# Patient Record
Sex: Male | Born: 1935 | Race: White | Hispanic: No | Marital: Married | Smoking: Former smoker
Health system: Southern US, Community
[De-identification: ages and names within clinical notes are randomized; demographics above are authoritative.]

## PROBLEM LIST (undated history)

## (undated) DIAGNOSIS — K8020 Calculus of gallbladder without cholecystitis without obstruction: Secondary | ICD-10-CM

## (undated) DIAGNOSIS — N183 Chronic kidney disease, stage 3 unspecified: Secondary | ICD-10-CM

## (undated) DIAGNOSIS — I639 Cerebral infarction, unspecified: Secondary | ICD-10-CM

## (undated) DIAGNOSIS — Z8719 Personal history of other diseases of the digestive system: Secondary | ICD-10-CM

## (undated) DIAGNOSIS — I4891 Unspecified atrial fibrillation: Secondary | ICD-10-CM

## (undated) DIAGNOSIS — F1010 Alcohol abuse, uncomplicated: Secondary | ICD-10-CM

## (undated) DIAGNOSIS — N390 Urinary tract infection, site not specified: Secondary | ICD-10-CM

## (undated) DIAGNOSIS — K8050 Calculus of bile duct without cholangitis or cholecystitis without obstruction: Secondary | ICD-10-CM

## (undated) DIAGNOSIS — S065X9A Traumatic subdural hemorrhage with loss of consciousness of unspecified duration, initial encounter: Secondary | ICD-10-CM

## (undated) DIAGNOSIS — I10 Essential (primary) hypertension: Secondary | ICD-10-CM

## (undated) HISTORY — DX: Personal history of other diseases of the digestive system: Z87.19

## (undated) HISTORY — PX: SPHINCTEROTOMY: SHX5279

## (undated) HISTORY — DX: Traumatic subdural hemorrhage with loss of consciousness of unspecified duration, initial encounter: S06.5X9A

## (undated) HISTORY — DX: Calculus of bile duct without cholangitis or cholecystitis without obstruction: K80.50

## (undated) HISTORY — PX: APPENDECTOMY: SHX54

## (undated) HISTORY — PX: CATARACT EXTRACTION: SUR2

## (undated) HISTORY — DX: Unspecified atrial fibrillation: I48.91

## (undated) HISTORY — DX: Alcohol abuse, uncomplicated: F10.10

## (undated) HISTORY — DX: Essential (primary) hypertension: I10

## (undated) HISTORY — DX: Urinary tract infection, site not specified: N39.0

## (undated) HISTORY — DX: Calculus of gallbladder without cholecystitis without obstruction: K80.20

---

## 2002-08-22 ENCOUNTER — Encounter: Payer: Self-pay | Admitting: Emergency Medicine

## 2002-08-22 ENCOUNTER — Inpatient Hospital Stay (HOSPITAL_COMMUNITY): Admission: EM | Admit: 2002-08-22 | Discharge: 2002-08-23 | Payer: Self-pay | Admitting: Emergency Medicine

## 2002-08-23 ENCOUNTER — Encounter: Payer: Self-pay | Admitting: General Surgery

## 2005-04-13 DIAGNOSIS — S065XAA Traumatic subdural hemorrhage with loss of consciousness status unknown, initial encounter: Secondary | ICD-10-CM

## 2005-04-13 DIAGNOSIS — S065X9A Traumatic subdural hemorrhage with loss of consciousness of unspecified duration, initial encounter: Secondary | ICD-10-CM

## 2005-04-13 HISTORY — DX: Traumatic subdural hemorrhage with loss of consciousness status unknown, initial encounter: S06.5XAA

## 2005-04-13 HISTORY — DX: Traumatic subdural hemorrhage with loss of consciousness of unspecified duration, initial encounter: S06.5X9A

## 2005-04-21 ENCOUNTER — Emergency Department (HOSPITAL_COMMUNITY): Admission: EM | Admit: 2005-04-21 | Discharge: 2005-04-21 | Payer: Self-pay | Admitting: Emergency Medicine

## 2005-04-22 ENCOUNTER — Ambulatory Visit (HOSPITAL_COMMUNITY): Admission: RE | Admit: 2005-04-22 | Discharge: 2005-04-22 | Payer: Self-pay | Admitting: Emergency Medicine

## 2006-04-11 ENCOUNTER — Inpatient Hospital Stay (HOSPITAL_COMMUNITY): Admission: EM | Admit: 2006-04-11 | Discharge: 2006-04-14 | Payer: Self-pay | Admitting: Emergency Medicine

## 2006-04-13 ENCOUNTER — Ambulatory Visit: Payer: Self-pay | Admitting: Gastroenterology

## 2006-04-13 DIAGNOSIS — Z8719 Personal history of other diseases of the digestive system: Secondary | ICD-10-CM

## 2006-04-13 HISTORY — DX: Personal history of other diseases of the digestive system: Z87.19

## 2006-06-03 ENCOUNTER — Ambulatory Visit: Payer: Self-pay | Admitting: Infectious Diseases

## 2006-06-03 ENCOUNTER — Inpatient Hospital Stay (HOSPITAL_COMMUNITY): Admission: EM | Admit: 2006-06-03 | Discharge: 2006-06-05 | Payer: Self-pay | Admitting: *Deleted

## 2006-06-15 ENCOUNTER — Ambulatory Visit: Payer: Self-pay | Admitting: *Deleted

## 2006-06-15 DIAGNOSIS — K219 Gastro-esophageal reflux disease without esophagitis: Secondary | ICD-10-CM

## 2006-09-20 ENCOUNTER — Ambulatory Visit: Payer: Self-pay | Admitting: Internal Medicine

## 2006-09-20 DIAGNOSIS — I10 Essential (primary) hypertension: Secondary | ICD-10-CM

## 2006-10-05 ENCOUNTER — Encounter (INDEPENDENT_AMBULATORY_CARE_PROVIDER_SITE_OTHER): Payer: Self-pay | Admitting: *Deleted

## 2006-10-05 ENCOUNTER — Ambulatory Visit: Payer: Self-pay | Admitting: Internal Medicine

## 2006-10-16 ENCOUNTER — Encounter (INDEPENDENT_AMBULATORY_CARE_PROVIDER_SITE_OTHER): Payer: Self-pay | Admitting: *Deleted

## 2006-12-17 ENCOUNTER — Ambulatory Visit: Payer: Self-pay | Admitting: *Deleted

## 2007-05-22 ENCOUNTER — Ambulatory Visit: Payer: Self-pay | Admitting: Internal Medicine

## 2007-05-22 ENCOUNTER — Encounter: Payer: Self-pay | Admitting: Internal Medicine

## 2007-05-22 LAB — CONVERTED CEMR LAB
AST: 18 units/L (ref 0–37)
Alkaline Phosphatase: 68 units/L (ref 39–117)
BUN: 26 mg/dL — ABNORMAL HIGH (ref 6–23)
Creatinine, Ser: 1.33 mg/dL (ref 0.40–1.50)
Glucose, Bld: 98 mg/dL (ref 70–99)
Potassium: 4.4 meq/L (ref 3.5–5.3)
Total Bilirubin: 0.7 mg/dL (ref 0.3–1.2)
Total Protein: 7.4 g/dL (ref 6.0–8.3)

## 2007-05-24 ENCOUNTER — Telehealth (INDEPENDENT_AMBULATORY_CARE_PROVIDER_SITE_OTHER): Payer: Self-pay | Admitting: Pharmacy Technician

## 2007-05-30 ENCOUNTER — Telehealth: Payer: Self-pay | Admitting: Internal Medicine

## 2007-08-26 ENCOUNTER — Telehealth (INDEPENDENT_AMBULATORY_CARE_PROVIDER_SITE_OTHER): Payer: Self-pay | Admitting: *Deleted

## 2007-11-19 ENCOUNTER — Ambulatory Visit: Payer: Self-pay | Admitting: Internal Medicine

## 2007-11-19 ENCOUNTER — Encounter: Payer: Self-pay | Admitting: Internal Medicine

## 2007-11-19 LAB — CONVERTED CEMR LAB
Basophils Absolute: 0 10*3/uL (ref 0.0–0.1)
Basophils Relative: 0 % (ref 0–1)
Cholesterol: 188 mg/dL (ref 0–200)
Eosinophils Absolute: 0.4 10*3/uL (ref 0.0–0.7)
HDL: 57 mg/dL (ref >39)
Hemoglobin: 15.1 g/dL (ref 13.0–17.0)
Lymphocytes Relative: 35 % (ref 12–46)
Monocytes Relative: 8 % (ref 3–12)
Neutro Abs: 3.4 10*3/uL (ref 1.7–7.7)
Total CHOL/HDL Ratio: 3.3
Triglycerides: 99 mg/dL (ref <150)

## 2007-11-27 ENCOUNTER — Ambulatory Visit: Payer: Self-pay | Admitting: Internal Medicine

## 2008-01-07 ENCOUNTER — Encounter: Payer: Self-pay | Admitting: Internal Medicine

## 2008-01-07 ENCOUNTER — Ambulatory Visit: Payer: Self-pay | Admitting: Internal Medicine

## 2008-01-07 LAB — CONVERTED CEMR LAB
BUN: 20 mg/dL (ref 6–23)
Calcium: 9.4 mg/dL (ref 8.4–10.5)
Chloride: 103 meq/L (ref 96–112)
Creatinine, Ser: 1.53 mg/dL — ABNORMAL HIGH (ref 0.40–1.50)
Sodium: 140 meq/L (ref 135–145)

## 2008-04-17 ENCOUNTER — Ambulatory Visit: Payer: Self-pay | Admitting: Internal Medicine

## 2008-04-17 DIAGNOSIS — Z9189 Other specified personal risk factors, not elsewhere classified: Secondary | ICD-10-CM | POA: Insufficient documentation

## 2008-05-04 ENCOUNTER — Ambulatory Visit: Payer: Self-pay | Admitting: Internal Medicine

## 2008-05-04 ENCOUNTER — Encounter (INDEPENDENT_AMBULATORY_CARE_PROVIDER_SITE_OTHER): Payer: Self-pay | Admitting: Internal Medicine

## 2008-05-06 LAB — CONVERTED CEMR LAB
Glucose, Bld: 91 mg/dL (ref 70–99)
Sodium: 141 meq/L (ref 135–145)

## 2008-05-14 ENCOUNTER — Ambulatory Visit: Payer: Self-pay | Admitting: *Deleted

## 2008-11-12 ENCOUNTER — Ambulatory Visit: Payer: Self-pay | Admitting: Internal Medicine

## 2008-11-13 LAB — CONVERTED CEMR LAB
Albumin: 4.5 g/dL (ref 3.5–5.2)
Alkaline Phosphatase: 65 units/L (ref 39–117)
Chloride: 105 meq/L (ref 96–112)
Creatinine, Ser: 1.72 mg/dL — ABNORMAL HIGH (ref 0.40–1.50)
Glucose, Bld: 100 mg/dL — ABNORMAL HIGH (ref 70–99)
Sodium: 141 meq/L (ref 135–145)

## 2009-05-31 ENCOUNTER — Telehealth: Payer: Self-pay | Admitting: Internal Medicine

## 2009-06-28 ENCOUNTER — Telehealth: Payer: Self-pay | Admitting: Internal Medicine

## 2009-07-06 ENCOUNTER — Ambulatory Visit: Payer: Self-pay | Admitting: Internal Medicine

## 2009-07-07 LAB — CONVERTED CEMR LAB
BUN: 31 mg/dL — ABNORMAL HIGH (ref 6–23)
Calcium: 9 mg/dL (ref 8.4–10.5)
Chloride: 103 meq/L (ref 96–112)
Creatinine, Ser: 1.72 mg/dL — ABNORMAL HIGH (ref 0.40–1.50)
Glucose, Bld: 87 mg/dL (ref 70–99)
Potassium: 3.9 meq/L (ref 3.5–5.3)
Sodium: 139 meq/L (ref 135–145)

## 2009-07-16 ENCOUNTER — Encounter: Payer: Self-pay | Admitting: Internal Medicine

## 2009-10-18 ENCOUNTER — Telehealth: Payer: Self-pay | Admitting: Internal Medicine

## 2009-11-29 ENCOUNTER — Telehealth: Payer: Self-pay | Admitting: Internal Medicine

## 2009-12-30 ENCOUNTER — Ambulatory Visit (HOSPITAL_COMMUNITY): Admission: RE | Admit: 2009-12-30 | Discharge: 2009-12-30 | Payer: Self-pay | Admitting: Internal Medicine

## 2009-12-30 ENCOUNTER — Ambulatory Visit: Payer: Self-pay | Admitting: Internal Medicine

## 2009-12-30 DIAGNOSIS — I4891 Unspecified atrial fibrillation: Secondary | ICD-10-CM | POA: Insufficient documentation

## 2010-04-10 LAB — CONVERTED CEMR LAB
CO2: 27 meq/L (ref 19–32)
Creatinine, Ser: 1.62 mg/dL — ABNORMAL HIGH (ref 0.40–1.50)

## 2010-04-12 NOTE — Progress Notes (Signed)
Summary: med refill/gp  Phone Note Refill Request Message from:  Fax from Pharmacy on June 28, 2009 12:08 PM  Refills Requested: Medication #1:  OMEPRAZOLE 40 MG CPDR take 1 tablet podaily.   Last Refilled: 05/24/2009  Method Requested: Electronic Initial call taken by: Chinita Pester RN,  June 28, 2009 12:08 PM    Prescriptions: OMEPRAZOLE 40 MG CPDR (OMEPRAZOLE) take 1 tablet podaily.  #30 x 3   Entered and Authorized by:   Hartley Barefoot MD   Signed by:   Hartley Barefoot MD on 06/28/2009   Method used:   Electronically to        [facility] (retail)       [address]       [place], [place]  [id number]       Ph: [phone] or [phone]       Fax: [phone]   RxID:   [id number]

## 2010-04-12 NOTE — Letter (Signed)
Summary: [facility]   Imported By: Margie Billet 07/20/2009 15:15:46  _____________________________________________________________________  External Attachment:    Type:   Image     Comment:   External Document

## 2010-04-12 NOTE — Progress Notes (Signed)
Summary: med refill/gp  Phone Note Refill Request Message from:  Fax from Pharmacy on May 31, 2009 2:40 PM  Refills Requested: Medication #1:  NORVASC 5 MG TABS take 1 tablet by mouth daily.   Last Refilled: 05/03/2009  Method Requested: Electronic Initial call taken by: Chinita Pester RN,  May 31, 2009 2:40 PM    Prescriptions: NORVASC 5 MG TABS (AMLODIPINE BESYLATE) take 1 tablet by mouth daily.  #30 x 5   Entered and Authorized by:   Hartley Barefoot MD   Signed by:   Hartley Barefoot MD on 05/31/2009   Method used:   Electronically to        [facility] (retail)       [address]       [place], [place]  [address]       Ph: [phone] or [phone]       Fax: [phone]   RxID:   [id number]
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
## 2010-04-12 NOTE — Progress Notes (Signed)
Summary: Refill/gh  Phone Note Refill Request Message from:  Fax from Pharmacy on October 18, 2009 9:37 AM  Refills Requested: Medication #1:  OMEPRAZOLE 40 MG CPDR take 1 tablet podaily.   Last Refilled: 09/20/2009  Method Requested: Electronic Initial call taken by: Angelina Ok RN,  October 18, 2009 9:37 AM    Prescriptions: OMEPRAZOLE 40 MG CPDR (OMEPRAZOLE) take 1 tablet podaily.  #30 x 6   Entered and Authorized by:   Zoila Shutter MD   Signed by:   Zoila Shutter MD on 10/18/2009   Method used:   Electronically to        [facility] (retail)       [address]       [place], [place]  [id number]       Ph: [phone] or [phone]       Fax: [phone]   RxID:   [id number]

## 2010-04-12 NOTE — Progress Notes (Signed)
Summary: Refill/gh  Phone Note Refill Request Message from:  Fax from Pharmacy on November 29, 2009 11:56 AM  Refills Requested: Medication #1:  NORVASC 5 MG TABS take 1 tablet by mouth daily.   Last Refilled: 10/26/2009  Method Requested: Electronic Initial call taken by: Angelina Ok RN,  November 29, 2009 11:56 AM    Prescriptions: NORVASC 5 MG TABS (AMLODIPINE BESYLATE) take 1 tablet by mouth daily.  #30 x 5   Entered and Authorized by:   Zoila Shutter MD   Signed by:   Zoila Shutter MD on 11/29/2009   Method used:   Electronically to        [facility] (retail)       [address]       [place], [place]  [address]       Ph: [phone] or [phone]       Fax: [phone]   RxID:   [id number]

## 2010-05-24 ENCOUNTER — Other Ambulatory Visit: Payer: Self-pay | Admitting: Internal Medicine

## 2010-05-31 ENCOUNTER — Other Ambulatory Visit: Payer: Self-pay | Admitting: *Deleted

## 2010-06-01 ENCOUNTER — Other Ambulatory Visit: Payer: Self-pay | Admitting: Internal Medicine

## 2010-06-02 MED ORDER — AMLODIPINE BESYLATE 5 MG PO TABS: 5.0000 mg | ORAL_TABLET | Freq: Every day | ORAL | Status: DC

## 2010-06-13 ENCOUNTER — Ambulatory Visit (INDEPENDENT_AMBULATORY_CARE_PROVIDER_SITE_OTHER): Payer: MEDICARE | Admitting: Internal Medicine

## 2010-06-13 ENCOUNTER — Telehealth: Payer: Self-pay | Admitting: *Deleted

## 2010-06-13 ENCOUNTER — Encounter: Payer: Self-pay | Admitting: Internal Medicine

## 2010-06-13 VITALS — BP 103/63 | HR 70 | Temp 97.2°F | Ht 69.5 in | Wt 171.6 lb

## 2010-06-13 DIAGNOSIS — R197 Diarrhea, unspecified: Secondary | ICD-10-CM

## 2010-06-13 NOTE — Progress Notes (Signed)
  Subjective:    Patient ID: [name] Page, male    DOB: Dec 03, 1924, 77 y.o.   MRN: [id number]  HPI Mr Sohn here with his wife of 78 years ("I'd say I know a thing or two about this man") who says he has had difficuly with loose stools and some accidents at nighttime over the last three days-has been drinking milk,  And has had a perfectly good appetite (had a big plate of fried oysters yesterday, so seems to be doing fine) after the trip to [facility] yesterday, diarrhea got worse.  No orthostatic sx's, cramps, N/V or fevers, blood in stool, or any other sx's   Review of Systems     Objective:   Physical Exam Sturdy looking man, hard of hearing, but in NAD HEENT: normal Lungs: CTA CAR: normal Abd: Soft no TTP, NABS Ext, warm, dry, no edema       Assessment & Plan:   Diarrhea Given basic advice on foods to eat, avoid milk products, and avoid greasy foods Take immodium prn FU if sx's persist >48 hrs or worsen

## 2010-06-13 NOTE — Telephone Encounter (Signed)
Agree with plan 

## 2010-06-13 NOTE — Telephone Encounter (Signed)
Pt's spouse calls to say pt has diarrhea x 4days, denies anyone else in family being sick, most everything he eats causes gi upset, denies N&V, he has been having small amts of incontinent stool at night, enough to soil the sheets. She is worried about dehydration due to the fact that the things he should be taking in are things he does not like.  he is given an appt w/ dr Reche Dixon at 1100

## 2010-06-13 NOTE — Patient Instructions (Signed)
DON'T TAKE THE EXPENSIVE BLOOD PRESSURE PILL DIOVAN-HCTZ-UNTIL WED MORNING  TAKE IMMODIUM UP TO 4 TIMES A DAY--OK TO GIVE IT WITH MEALS AND AT BEDTIME FOR THE NEXT TWO DAYS IF HE STILL HAS DIARRHEAH  NO RAW OYSTERS, FRIED FOODS OR MILK PRODUCTS FOR THE NEXT 4-5 DAYS

## 2010-07-25 ENCOUNTER — Encounter: Payer: Self-pay | Admitting: Internal Medicine

## 2010-07-25 DIAGNOSIS — K219 Gastro-esophageal reflux disease without esophagitis: Secondary | ICD-10-CM

## 2010-07-26 ENCOUNTER — Encounter: Payer: Self-pay | Admitting: Internal Medicine

## 2010-07-26 ENCOUNTER — Ambulatory Visit (INDEPENDENT_AMBULATORY_CARE_PROVIDER_SITE_OTHER): Payer: MEDICARE | Admitting: Internal Medicine

## 2010-07-26 VITALS — BP 120/62 | HR 64 | Temp 98.1°F | Ht 69.5 in | Wt 173.3 lb

## 2010-07-26 DIAGNOSIS — I639 Cerebral infarction, unspecified: Secondary | ICD-10-CM | POA: Insufficient documentation

## 2010-07-26 DIAGNOSIS — Z7289 Other problems related to lifestyle: Secondary | ICD-10-CM | POA: Insufficient documentation

## 2010-07-26 DIAGNOSIS — I6350 Cerebral infarction due to unspecified occlusion or stenosis of unspecified cerebral artery: Secondary | ICD-10-CM

## 2010-07-26 DIAGNOSIS — I4891 Unspecified atrial fibrillation: Secondary | ICD-10-CM

## 2010-07-26 DIAGNOSIS — N259 Disorder resulting from impaired renal tubular function, unspecified: Secondary | ICD-10-CM

## 2010-07-26 DIAGNOSIS — H6120 Impacted cerumen, unspecified ear: Secondary | ICD-10-CM

## 2010-07-26 DIAGNOSIS — I10 Essential (primary) hypertension: Secondary | ICD-10-CM

## 2010-07-26 LAB — COMPREHENSIVE METABOLIC PANEL
ALT: 13 U/L (ref 0–53)
Alkaline Phosphatase: 72 U/L (ref 39–117)
Chloride: 101 mEq/L (ref 96–112)
Creat: 1.5 mg/dL (ref 0.40–1.50)
Glucose, Bld: 95 mg/dL (ref 70–99)
Sodium: 137 mEq/L (ref 135–145)
Total Protein: 6.9 g/dL (ref 6.0–8.3)

## 2010-07-26 LAB — LIPID PANEL
Cholesterol: 179 mg/dL (ref 0–200)
LDL Cholesterol: 103 mg/dL — ABNORMAL HIGH (ref 0–99)
Triglycerides: 82 mg/dL (ref <150)
VLDL: 16 mg/dL (ref 0–40)

## 2010-07-26 MED ORDER — VALSARTAN-HYDROCHLOROTHIAZIDE 160-25 MG PO TABS: 1.0000 | ORAL_TABLET | Freq: Every day | ORAL | Status: DC

## 2010-07-26 NOTE — Assessment & Plan Note (Signed)
Ear irrigation successfully performed by the nurse on the left canal.

## 2010-07-26 NOTE — Assessment & Plan Note (Signed)
This is stable and rate control. Patient is not a candidate for Coumadin and neither he or his wife want to be on even a low dose aspirin because of his hospitalization for severe GI bleed in 2008. Risks and benefits are discussed with them at length.
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
## 2010-07-26 NOTE — Assessment & Plan Note (Signed)
Patient has an history of alcohol abuse at this point he is drinking to peers today by his and his wife's history and I don't think that B. appears to change or that I can tell it is causing a problem at this point. We'll follow.

## 2010-07-26 NOTE — Assessment & Plan Note (Signed)
Again as per discussion during the history of present illness patient has had probable mild cerebrovascular accident versus a mechanical dysfunction of his lower extremities. Again at this point neither he or his wife wanted him to be on either aspirin or have any further imaging. We'll continue with aggressive blood pressure control and monitoring.

## 2010-07-26 NOTE — Assessment & Plan Note (Signed)
Will check C met today 

## 2010-07-26 NOTE — Patient Instructions (Signed)
I will see you back in 6 months. We will let you know if your labs are abnormal.

## 2010-07-26 NOTE — Assessment & Plan Note (Signed)
Blood pressure is under excellent control and this is probably the most important thing to do as far as preventing his risks for further cerebrovascular accident.
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
## 2010-07-29 NOTE — H&P (Signed)
[name] Page, [name] Page              ACCOUNT NO.:  [id number]   MEDICAL RECORD NO.:  [id number]          PATIENT TYPE:  INP   LOCATION:  5005                         FACILITY:  [facility]   PHYSICIAN:  Wilson Singer, M.D.DATE OF BIRTH:  23-Aug-1924   DATE OF ADMISSION:  04/11/2006  DATE OF DISCHARGE:                              HISTORY & PHYSICAL   HISTORY OF PRESENT ILLNESS:  This 77 year old man who has no primary  care physician presents with a 4-day history of feeling weak.  He does  describe a history of melena approximately 5 days ago and he also told  the emergency room physician that there was some blood in the stool  today.  He has also been feeling light-headed and dizzy, but has not had  any frank syncopal episodes.  There is no abdominal pain, nausea, or  vomiting.  The only past significant history he has is one of  hypertension.  He has not had a physician for many years after his  previous physician, Dr. Scotty Court, retired.   PAST MEDICAL HISTORY:  Hypertension, as mentioned above.   PAST SURGICAL HISTORY:  Appendectomy at the age of 77 years old.   MEDICATIONS:  Micardis HCT 80/12.5 one tablet q. Daily.   ALLERGIES:  NONE.   SOCIAL HISTORY:  He has been married for over 50 years.  He does smoke 1  pack of cigarettes about every 3 days.  He does not drink alcohol.  He  is retired.   FAMILY HISTORY:  Noncontributory.   REVIEW OF SYSTEMS:  Apart from the symptoms mentioned above, there are  no other symptoms referable to the constitutional, ENT, respiratory,  cardiovascular, gastroenterology, genitourinary, musculoskeletal,  dermatological, neurological, endocrine, psychiatric systems.   PHYSICAL EXAMINATION:  He is alert and oriented but he does look pale.  Blood pressure 108/44, pulse 90 and respirations 12.  CARDIOVASCULAR:  Heart sounds are present and normal without murmurs.  RESPIRATORY:  Lungs fields are clear.  ABDOMEN:  Is soft and nontender.  There is no  hepatosplenomegaly.  RECTAL EXAMINATION was deferred as history is very clear.  NEUROLOGICAL:  Alert and oriented.  There are no focal neurologic signs.  MUSCULOSKELETAL:  There are no joint effusions or deformities.   INVESTIGATION:  Sodium 140, potassium 3.8, chloride 109, BUN 55, glucose  95, pH 7.43, hemoglobin 5.9, white blood cell count 8.0, platelets  152,000.   IMPRESSION AND PLAN:  1. Gastrointestinal bleed.  2. History of hypertension.  3. Atrial fibrillation on rhythm strip.  He is hemodynamically stable      with no evidence of active bleeding at the current time.   Plan:  1. We will need to give him blood in view of very low hemoglobin.  2. GI consult with a view to upper and possibly lower GI endoscopy.  3. Further recommendations will depend on this patient's progress.           ______________________________  Wilson Singer, M.D.     NCG/MEDQ  D:  04/11/2006  T:  04/12/2006  Job:  [id number]

## 2010-07-29 NOTE — Discharge Summary (Signed)
NAMESAYVION, VIGEN NO.:  [id number]   MEDICAL RECORD NO.:  [id number]          PATIENT TYPE:  INP   LOCATION:  4739                         FACILITY:  [facility]   PHYSICIAN:  Fransisco Hertz, M.D.  DATE OF BIRTH:  09-May-1924   DATE OF ADMISSION:  06/03/2006  DATE OF DISCHARGE:  06/05/2006                               DISCHARGE SUMMARY   DISCHARGE DIAGNOSES:  1. Community-acquired pneumonia.  2. Atrial fibrillation.  3. History of hypertension.  4. Chronic renal insufficiency.  5. History of upper gastrointestinal bleed secondary to peptic ulcer      disease.   DISCHARGE MEDICATIONS:  1. Avelox 400 mg 1 tablet p.o. daily for 7 days.  2. Protonix 40 mg p.o. daily.   DISPOSITION AND FOLLOWUP:  The patient will follow up with Dr. Phillips Odor  at the [facility] outpatient clinic on April4 at 10:30 a.m. At that  time, he should be evaluated for continued resolution of his pneumonia.  Please make sure that he is not febrile and does not have white count.  Also please ensure that a repeat chest CT scan is followed up in 4-6  weeks to ensure resolution of his multifocal pneumonia.   IMAGING PERFORMED THIS HOSPITALIZATION:  1. A chest x-ray on June 03, 2006, consistent with a right upper lobe      pneumonia.  A repeat chest x-ray on June 04, 2006, consistent with      scattered areas of air space opacification consistent with      multifocal pneumonia, progression in the lung bases, particularly      left-side.  2. Chest CT on June 04, 2006, consistent with consolidated infiltrate      within both lung bases in the posterior basal segment of the right      lower lobe.  Both of the right-sided infiltrates have areas of      central necrosis.  There is also a small right pleural effusion.      Right suprahilar adenopathy is suspected as well and      cholelithiasis.   BRIEF HISTORY AND PHYSICAL:  For full details, please refer to the  chart, but in brief, Mr.  Lucarelli is a very active 77 year old Caucasian  male who has a history of hypertension, not on any antihypertensives,  and a recent hospitalization in February2008 for an acute GI bleed  secondary to peptic ulcer disease caused by NSAID use.  He did not have  a regular primary care physician,  and he presented to the [facility]  emergency department complaining of chest discomfort, especially on deep  inspiration, since 1 week following a brief viral illness.  Chest  discomfort was accompanied by productive cough, whitish in color with  minimal quantity. No hemoptysis and some dyspnea on exertion.  He is a  long-time smoker.   PHYSICAL EXAMINATION:  VITAL SIGNS:  Upon admission, temperature 98.5,  blood pressure 158/88, heart rate 94, respirations 22, O2 saturation 97%  on room air.   LABORATORY DATA:  Upon admission, sodium 137, potassium 4.2, chloride  105, bicarb 25, BUN 16,  creatinine 1.5, anion gap of 7.  WBC 8.3 with an  ANC of 6.3, hemoglobin  11.  His last hemoglobin upon discharge from the  hospital in February2008 was 10.4, MCV of 97.7, RDW 16, platelets  344.  His UA was negative.  He also had cardiac enzymes which were all  negative.  Last set was CK of 37, CK-MB of 1.9 and a troponin I of 0.04.   HOSPITAL COURSE:  #1.  PLEURITIC CHEST PAIN:  Acute coronary syndrome was ruled out with a  normal EKG and three sets of cardiac enzymes. Of course, this pleuritic  chest pain was most concerning for some sort of the lung pathology.  He  never had a white count or was never febrile; however, a chest x-ray  demonstrated some infiltrates consistent with pneumonia which is why he  was placed on a community-acquired regimen with Rocephin and  azithromycin. However, given the fact that on second day his pneumonia  progressed to having several infiltrates in both lungs and the fact that  his pleuritic chest pain continued, we decided to get a chest CT that  showed multifocal pneumonia  with two areas of central necrosis on the  right side and some questionable right suprahilar lymphadenopathy.  However, again the patient never developed a white count and never  developed a fever, so we transitioned him to a p.o. regimen of Avelox.  His chest discomfort is much improved upon day of discharge.  What we  would like to do is send him home with a 7-day regimen of Avelox, and he  will need a repeat chest CT scan in about 4-6 weeks to ensure resolution  of his pneumonia and to make sure he does not have any masses that could  be leading to some sort of post obstructive pneumonia, specifically  given the fact that he is a long-time smoker.   #2.  ATRIAL FIBRILLATION:  Atrial fibrillation was discovered on his  previous hospital admission in February.  He is not on anticoagulation  secondary to GI bleed.   #3.  UPPER GASTROINTESTINAL BLEED:  This was not an issue throughout  this hospitalization. Hemoglobin never dropped. Vital signs stable.  We  kept him on his home Protonix regimen.   #4.  CHRONIC RENAL INSUFFICIENCY:  Creatinine is at baseline; in fact, a  little bit improved on day of discharge at 1.3.   #5.  HYPERTENSION:  He is currently not on any antihypertensives, and we  have kept him so throughout this hospitalization.   All other chronic medical issues were stable.   LABORATORY DATA AND VITAL SIGNS ON DAY OF DISCHARGE:  Temperature 98.5,  blood pressure 131/87, heart rate 90, respirations 20, O2 saturation 96%  on room air.  Labs on day of discharge: WBC 5.7, hemoglobin 10.5,  platelets 301.  Sodium 135, potassium 4.3, chloride 106, bicarb 24 BUN  10, creatinine 1.3, glucose 99. Respiratory culture is most likely  contaminated oral flora that shows few gram-positive cocci in clusters,  few gram-negative rods and few gram-positive rods.      Peggye Pitt, M.D.  Electronically Signed     Fransisco Hertz, M.D.  Electronically Signed   EH/MEDQ  D:   06/05/2006  T:  06/05/2006  Job:  [id number]   cc:   Edsel Petrin, D.O.

## 2010-07-29 NOTE — Discharge Summary (Signed)
[name] Page, [name] Page NO.:  [id number]   MEDICAL RECORD NO.:  [id number]          PATIENT TYPE:  INP   LOCATION:  5005                         FACILITY:  [facility]   PHYSICIAN:  Isidor Holts, M.D.  DATE OF BIRTH:  [birth year]   DATE OF ADMISSION:  04/11/2006  DATE OF DISCHARGE:  04/14/2006                               DISCHARGE SUMMARY   DISCHARGE DIAGNOSES:  1. Acute upper gastrointestinal bleed, secondary to nonsteroidal anti-      inflammatory-induced bleeding duodenal ulcer.  2. Acute blood loss anemia secondary to #1 above. Required transfusion      of 4 units packed red blood cells.  3. History of hypertension.  4. Smoking history.  5. Atrial fibrillation, new diagnosis.   DISCHARGE MEDICATIONS:  Protonix 40 mg p.o. daily.   PROCEDURE:  Upper GI endoscopy, dated April 12, 2006, by Dr. Christella Hartigan.  This showed actively oozing duodenal bulb ulcer.  This was treated with  2 cc of dilute epinephrine and 2 Endoclips.  CLO biopsy taken.  Duodenal  ulcer was 5-6-mm in diameter.   CONSULTATIONS:  Rachael Fee, M.D., gastroenterologist.   ADMISSION HISTORY:  As in H&P notes of April 11, 3005, dictated by Dr.  Lilly Cove.  However, in brief this is a 77 year old male, with  known history of hypertension, otherwise no significant past medical  history, and who had not seen a physician for several years following  retirement of his previous primary care physician, Dr. Scotty Court.  He  presented with a 4-day history of weakness, preceded by melena 5 days  previously, and also a single episode of melena on the day of  presentation.  Initial laboratory evaluation showed a hemoglobin of 5.1,  as well as a high BUN/creatinine ratio with BUN 55, creatinine 1.5,  consistent with GI bleed.  On detailed questioning, it turned out that  this patient habitually utilizes [place] powder. He was admitted for  further evaluation, investigation, and management.   CLINICAL  COURSE:  1. Acute GI bleed.  The patient was hemodynamically stable at the time      of presentation, with a BP of 108/44.  Pulse 90.  However, he did      look clinically pale, so was managed with transfusion of packed red      blood cells, 4 units in total with normalization of his hematologic      indices, so that by April 14, 2006, hemoglobin was      stable/satisfactory at 10.4 with a hematocrit of 30.8.  GI      consultation was called, and was kindly provided by Dr. Rob Bunting, who performed an upper GI endoscopy on April 12, 2006.      This showed a 5-6 mm shallow but actively oozing duodenal ulcer in      the distal duodenal bulb.  This was addressed with injection of      dilute epinephrine in two locations and 2 Endoclips were placed,      with cessation of bleeding.  Several biopsies were taken from the  gastric antrum and the duodenal bulb.  These turned out to be      negative for H. pylori. Conclusion, is that the patient has an      NSAID-induced duodenal ulcer which was precipitated by his habit of      utilizing [employer] Powders on a regular basis.  He was managed with      proton pump inhibitor treatment and strongly recommended never to      utilize NSAIDs ever again.  The patient thereafter, remained stable      during the course of his hospitalization.  No further episodes of      melena were recorded.  And by April 14, 2006, he was considered      clinically stable to be discharged.   1. Hypertension.  The patient has a known history of hypertension, and      had been on Micardis/HCT (870/12.5) daily.  However, during this      hospitalization, his BP remained at low normal levels, and on      April 14, 2006, was actually 103/63-mmHg.  Antihypertensives have      not be recommenced.  The patient was recommended to stay off them,      but on a heart healthy diet and to establish followup with a PMD,      who will monitor his blood pressure and  reinstate medications if      indicated.   1. Atrial fibrillation.  The patient was found to be in rate-      controlled atrial fibrillation at the time of presentation.  This      is a new diagnosis.  The patient remained rate controlled, during      the course of his hospitalization.  Clearly, he will not be a      candidate for anticoagulation, secondary to his recent upper GI      bleed.   1. Smoking history.  The patient has been counseled appropriately.   DISPOSITION:  The patient was considered sufficiently clinically  recovered and stable to be discharged on April 14, 2006.   DIET:  Heart healthy diet.   WOUND CARE:  Not applicable.   ACTIVITY:  No restrictions.   FOLLOWUP INSTRUCTIONS:  The patient is recommended to establish a PMD  for routine and preventative care.  He has been supplied appropriate  information.   SPECIAL INSTRUCTIONS:  The patient is recommended to avoid anti-  inflammatory medications, including Goody Power, Aleve, ibuprofen,  Advil, etc.  He is also instructed to stop the Micardis/HCT (80/12.5) as  blood pressure is normal currently.  All this has been communicated to  the patient.  He has verbalized understanding.      Isidor Holts, M.D.  Electronically Signed     CO/MEDQ  D:  04/14/2006  T:  04/14/2006  Job:  [id number]
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
## 2011-05-12 ENCOUNTER — Encounter: Payer: Self-pay | Admitting: Internal Medicine

## 2011-05-12 ENCOUNTER — Ambulatory Visit (INDEPENDENT_AMBULATORY_CARE_PROVIDER_SITE_OTHER): Payer: Medicare Other | Admitting: Internal Medicine

## 2011-05-12 DIAGNOSIS — I10 Essential (primary) hypertension: Secondary | ICD-10-CM

## 2011-05-12 DIAGNOSIS — K219 Gastro-esophageal reflux disease without esophagitis: Secondary | ICD-10-CM

## 2011-05-12 DIAGNOSIS — I4891 Unspecified atrial fibrillation: Secondary | ICD-10-CM

## 2011-05-12 DIAGNOSIS — L259 Unspecified contact dermatitis, unspecified cause: Secondary | ICD-10-CM

## 2011-05-12 DIAGNOSIS — H6120 Impacted cerumen, unspecified ear: Secondary | ICD-10-CM

## 2011-05-12 DIAGNOSIS — M25561 Pain in right knee: Secondary | ICD-10-CM | POA: Insufficient documentation

## 2011-05-12 DIAGNOSIS — M25569 Pain in unspecified knee: Secondary | ICD-10-CM

## 2011-05-12 DIAGNOSIS — L309 Dermatitis, unspecified: Secondary | ICD-10-CM

## 2011-05-12 MED ORDER — OMEPRAZOLE 40 MG PO CPDR: 40.0000 mg | DELAYED_RELEASE_CAPSULE | Freq: Every day | ORAL | Status: DC

## 2011-05-12 MED ORDER — DESONIDE 0.05 % EX GEL
Freq: Two times a day (BID) | CUTANEOUS | Status: DC
Start: 2011-05-12 — End: 2011-11-22

## 2011-05-12 MED ORDER — DICLOFENAC SODIUM 1 % TD GEL: 1.0000 "application " | Freq: Four times a day (QID) | TRANSDERMAL | Status: DC

## 2011-05-12 MED ORDER — CLOBETASOL PROPIONATE 0.05 % EX CREA: TOPICAL_CREAM | Freq: Two times a day (BID) | CUTANEOUS | Status: DC

## 2011-05-12 MED ORDER — AMLODIPINE BESYLATE 5 MG PO TABS: 5.0000 mg | ORAL_TABLET | Freq: Every day | ORAL | Status: DC

## 2011-05-12 NOTE — Assessment & Plan Note (Signed)
Adequately controlled.  His BP is usually well controlled in the past.  I think it is slightly elevated because of his knee pain. -Will continue current regimen for now. -Refilled Norvasc  -Continue diovan 160/25mg  qd

## 2011-05-12 NOTE — Patient Instructions (Signed)
Apply Voltaren gel 4 times daily on your right knee for knee pain Apply Temovate cream to both of your legs 2 times daily, avoid inguinal, axilla, or face areas Apply Desonate 0.05% gel behind your ears twice daily We will refer you to a dermatologist Continue your current medications Follow up in 6 months or sooner if needed

## 2011-05-12 NOTE — Assessment & Plan Note (Signed)
Nurse tried irrigation without success because of harden cerumen. -I referred him to ENT but wife states that she will try wax softener at home and will get the aid to help. -I think it is a good idea for him to see ENT every 6-12 months so that his cerumen can be safely removed under a microscope.

## 2011-05-12 NOTE — Assessment & Plan Note (Signed)
Stable, continue omeprazole.  

## 2011-05-12 NOTE — Progress Notes (Signed)
HPI: Mr. [name] Page is an 78 yo M with PMH of HTN, atrial fibrillation rate controlled without BB and not on Coumadin nor aspirin 2/2 to recurrent falls and hx of subdural hematoma & GI bleed.  He presents today for routine follow up for medication refills.  He is accompanied by his wife who provides most of the history.  She states that he has rash on bilateral legs for more than 1 year.  The rash is red, diffused with different sizes with the largest one near the distal ends of his legs.  He complains of burning sensation but denies any pruritis.  Denies any associated fever, chills, outdoor activities, recent traveling.  He also complaints of itching behind his ears for the past several months as well. Also wants was removal from both ears.  He also has right knee pain in the past 6 months which is relieved with Voltaren gel.  He has a history of having a hard time picking up his foot to walk on the right side.  Patient's wife does not to have aggressive intervention; therefore, does not want xray or be referred to orthopedics.    ROS: as per HPI  PE: General: alert, well-developed, and cooperative to examination.  Ears: almost total cerumen impaction on both ears, behind both ears, there is erythema with scaly plaques, scattered  Lungs: normal respiratory effort, no accessory muscle use, normal breath sounds, no crackles, and no wheezes. Heart: irregularly irregular, no murmur, no gallop, and no rub.  Abdomen: soft, non-tender, normal bowel sounds, no distention, no guarding, no rebound tenderness Msk: Right Knee: limited ROM with crepitus on movement, tenderness during range of motion, no effusion noted, no edema or erythema or drainage or increased in warmth.  Left Knee: normal Extremities: No cyanosis, clubbing, edema Neurologic: alert & oriented X3, cranial nerves II-XII intact, strength normal in all extremities, sensation intact to light touch, and unsteady gait  Skin: turgor normal.  LE  bilaterally: diffused papulomacular erythematous rash with some excoriation marks with the largest in size 10x2cm on the distal aspect of the legs, slightly raised but no drainage or pustules noted.

## 2011-05-12 NOTE — Assessment & Plan Note (Signed)
Post-Auricle rash: appearance consistent with Seborrheic dermatitis.  While the rash on his lower extremities could be due to contact/atopic dermatitis vs lichen simplex chronicus.   -Will treat with a lower potency steroid : Desonide 0.05% gel for his ears  -Will treat with higher potency: Temovate 0.05% for his legs, avoid face, flexural, inguinal, axilla areas -Will also refer him to dermatology -Avoid socks/pants/boots that may cause the contact dermatitis

## 2011-05-12 NOTE — Assessment & Plan Note (Signed)
Likely osteoarthritis.  Patient's wife does not want aggressive intervention such as imaging or orthopedics -Will treat symptomatically with Voltaren gel 1% qid to affected area.

## 2011-05-12 NOTE — Assessment & Plan Note (Signed)
Rate controlled even without BB or CCB.  He is still in afib but wife still declines warfarin and aspirin because of his bleeding risk.  She acknowledges the risk and understands.  Please refer to Dr. Sandy Salaam note for more complete details.   -Will continue to monitor

## 2011-05-29 ENCOUNTER — Telehealth: Payer: Self-pay | Admitting: Internal Medicine

## 2011-05-29 NOTE — Telephone Encounter (Signed)
Called ENT office of Dr. Annalee Genta and spoke with Thelma Barge.  Patient kept appointment on 05/25/2011.  I was forwarded to [employer] system for Medical records Request. Awaiting Last office visit.

## 2011-05-29 NOTE — Telephone Encounter (Signed)
Spoke with [name] Page again and confirmed again that patient has kept his appointment and the office visit notes from Dr. Annalee Genta have been received for Appointment 05/25/2011.

## 2011-06-05 ENCOUNTER — Other Ambulatory Visit: Payer: Self-pay | Admitting: *Deleted

## 2011-06-05 DIAGNOSIS — L309 Dermatitis, unspecified: Secondary | ICD-10-CM

## 2011-06-05 NOTE — Telephone Encounter (Signed)
Does it help with the rash on his legs?

## 2011-06-06 ENCOUNTER — Other Ambulatory Visit: Payer: Self-pay | Admitting: Internal Medicine

## 2011-06-06 DIAGNOSIS — L309 Dermatitis, unspecified: Secondary | ICD-10-CM

## 2011-06-06 MED ORDER — CLOBETASOL PROPIONATE 0.05 % EX CREA: TOPICAL_CREAM | Freq: Two times a day (BID) | CUTANEOUS | Status: DC

## 2011-06-06 NOTE — Telephone Encounter (Signed)
Pt not available - talked with wife - rash is much better - unable to see Dr Antonieta Loveless till 10/2011.

## 2011-06-22 ENCOUNTER — Telehealth: Payer: Self-pay | Admitting: Licensed Clinical Social Worker
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
## 2011-07-12 ENCOUNTER — Emergency Department (HOSPITAL_COMMUNITY)
Admission: EM | Admit: 2011-07-12 | Discharge: 2011-07-12 | Disposition: A | Discharge status: Home / Self Care | Payer: Medicare Other | Attending: Emergency Medicine | Admitting: Emergency Medicine

## 2011-07-12 ENCOUNTER — Emergency Department (INDEPENDENT_AMBULATORY_CARE_PROVIDER_SITE_OTHER): Payer: Medicare Other

## 2011-07-12 ENCOUNTER — Encounter (HOSPITAL_COMMUNITY): Payer: Self-pay

## 2011-07-12 ENCOUNTER — Emergency Department (INDEPENDENT_AMBULATORY_CARE_PROVIDER_SITE_OTHER)
Admission: EM | Admit: 2011-07-12 | Discharge: 2011-07-12 | Disposition: A | Discharge status: Other Acute Inpatient Hospital | Payer: Medicare Other | Source: Home / Self Care | Attending: Family Medicine | Admitting: Family Medicine

## 2011-07-12 DIAGNOSIS — W19XXXA Unspecified fall, initial encounter: Secondary | ICD-10-CM

## 2011-07-12 DIAGNOSIS — L03119 Cellulitis of unspecified part of limb: Secondary | ICD-10-CM

## 2011-07-12 DIAGNOSIS — L02419 Cutaneous abscess of limb, unspecified: Secondary | ICD-10-CM

## 2011-07-12 DIAGNOSIS — L03116 Cellulitis of left lower limb: Secondary | ICD-10-CM

## 2011-07-12 DIAGNOSIS — I4891 Unspecified atrial fibrillation: Secondary | ICD-10-CM | POA: Insufficient documentation

## 2011-07-12 DIAGNOSIS — I10 Essential (primary) hypertension: Secondary | ICD-10-CM | POA: Insufficient documentation

## 2011-07-12 LAB — DIFFERENTIAL
Basophils Absolute: 0 10*3/uL (ref 0.0–0.1)
Eosinophils Absolute: 0.6 10*3/uL (ref 0.0–0.7)
Monocytes Absolute: 0.8 10*3/uL (ref 0.1–1.0)
Monocytes Relative: 10 % (ref 3–12)
Neutro Abs: 5.2 10*3/uL (ref 1.7–7.7)

## 2011-07-12 LAB — BASIC METABOLIC PANEL
BUN: 23 mg/dL (ref 6–23)
Calcium: 8.9 mg/dL (ref 8.4–10.5)
Creatinine, Ser: 1.5 mg/dL — ABNORMAL HIGH (ref 0.50–1.35)
Potassium: 3.9 mEq/L (ref 3.5–5.1)
Sodium: 138 mEq/L (ref 135–145)

## 2011-07-12 LAB — CBC
Hemoglobin: 12.6 g/dL — ABNORMAL LOW (ref 13.0–17.0)
MCH: 34.7 pg — ABNORMAL HIGH (ref 26.0–34.0)
MCHC: 34.9 g/dL (ref 30.0–36.0)
Platelets: 157 10*3/uL (ref 150–400)
WBC: 7.9 10*3/uL (ref 4.0–10.5)

## 2011-07-12 MED ORDER — CLINDAMYCIN HCL 150 MG PO CAPS: 300.0000 mg | ORAL_CAPSULE | Freq: Four times a day (QID) | ORAL | Status: AC

## 2011-07-12 MED ORDER — TETANUS-DIPHTH-ACELL PERTUSSIS 5-2.5-18.5 LF-MCG/0.5 IM SUSP
INTRAMUSCULAR | Status: AC
  Filled 2011-07-12: qty 0.5

## 2011-07-12 MED ORDER — CLINDAMYCIN PHOSPHATE 600 MG/50ML IV SOLN
600.0000 mg | Freq: Once | INTRAVENOUS | Status: AC
  Administered 2011-07-12: 600 mg via INTRAVENOUS
  Filled 2011-07-12: qty 50

## 2011-07-12 MED ORDER — TETANUS-DIPHTH-ACELL PERTUSSIS 5-2.5-18.5 LF-MCG/0.5 IM SUSP
0.5000 mL | Freq: Once | INTRAMUSCULAR | Status: AC
  Administered 2011-07-12: 0.5 mL via INTRAMUSCULAR
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
## 2011-07-12 NOTE — ED Notes (Signed)
Pt came from urgent care: reports that he fell about 2 weeks ago in the kitchen. His right knee is swollen and has a wound about the size of a nickel.  It's red with purulent discharge.  Pt's family member states that infection started about a week ago. They have put antibiotic ointment on it but it has not shown any signs of improvement. He is coming here to receive IV antibiotics.

## 2011-07-12 NOTE — ED Notes (Signed)
Lt. Knee pain and swelling, sent from our urgent care, was diagnosed with a small effusion on his lt. Knee Pt. Sent to [place] for IV abxx.  Was given tdap at urgent care
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
## 2011-07-12 NOTE — Discharge Instructions (Signed)
Cellulitis Cellulitis is an infection of the tissue under the skin. The infected area is usually red and tender. This is caused by germs. These germs enter the body through cuts or sores. This usually happens in the arms or lower legs. HOME CARE   Take your medicine as told. Finish it even if you start to feel better.   If the infection is on the arm or leg, keep it raised (elevated).   Use a warm cloth on the infected area several times a day.   See your doctor for a follow-up visit as told.  GET HELP RIGHT AWAY IF:   You are tired or confused.   You throw up (vomit).   You have watery poop (diarrhea).   You feel ill and have muscle aches.   You have a fever.  MAKE SURE YOU:   Understand these instructions.   Will watch your condition.   Will get help right away if you are not doing well or get worse.  Document Released: 08/16/2007 Document Revised: 02/16/2011 Document Reviewed: 01/29/2009 ExitCare Patient Information 2012 [employer]. 

## 2011-07-12 NOTE — ED Notes (Signed)
Reportedly fell 2 weeks ago, abraded left knee , and had improved w home treatment for a while , but got worse past couple of days

## 2011-07-24 NOTE — ED Provider Notes (Signed)
I saw and evaluated the patient, reviewed the resident's note and I agree with the findings and plan.   Sariyah Corcino, MD 07/24/11 0710 

## 2011-08-10 ENCOUNTER — Other Ambulatory Visit: Payer: Self-pay | Admitting: Internal Medicine

## 2011-11-03 ENCOUNTER — Observation Stay (HOSPITAL_COMMUNITY)
Admission: EM | Admit: 2011-11-03 | Discharge: 2011-11-05 | Disposition: A | Discharge status: Home / Self Care | Payer: Medicare Other | Attending: Internal Medicine | Admitting: Internal Medicine

## 2011-11-03 ENCOUNTER — Encounter (HOSPITAL_COMMUNITY): Payer: Self-pay | Admitting: *Deleted

## 2011-11-03 DIAGNOSIS — Z79899 Other long term (current) drug therapy: Secondary | ICD-10-CM | POA: Insufficient documentation

## 2011-11-03 DIAGNOSIS — K219 Gastro-esophageal reflux disease without esophagitis: Secondary | ICD-10-CM

## 2011-11-03 DIAGNOSIS — N390 Urinary tract infection, site not specified: Secondary | ICD-10-CM

## 2011-11-03 DIAGNOSIS — F1090 Alcohol use, unspecified, uncomplicated: Secondary | ICD-10-CM

## 2011-11-03 DIAGNOSIS — M25569 Pain in unspecified knee: Secondary | ICD-10-CM | POA: Insufficient documentation

## 2011-11-03 DIAGNOSIS — Z87891 Personal history of nicotine dependence: Secondary | ICD-10-CM | POA: Insufficient documentation

## 2011-11-03 DIAGNOSIS — R531 Weakness: Secondary | ICD-10-CM

## 2011-11-03 DIAGNOSIS — M129 Arthropathy, unspecified: Secondary | ICD-10-CM | POA: Insufficient documentation

## 2011-11-03 DIAGNOSIS — Z7289 Other problems related to lifestyle: Secondary | ICD-10-CM

## 2011-11-03 DIAGNOSIS — I129 Hypertensive chronic kidney disease with stage 1 through stage 4 chronic kidney disease, or unspecified chronic kidney disease: Secondary | ICD-10-CM | POA: Insufficient documentation

## 2011-11-03 DIAGNOSIS — I10 Essential (primary) hypertension: Secondary | ICD-10-CM

## 2011-11-03 DIAGNOSIS — M25561 Pain in right knee: Secondary | ICD-10-CM | POA: Diagnosis present

## 2011-11-03 DIAGNOSIS — M79609 Pain in unspecified limb: Principal | ICD-10-CM | POA: Insufficient documentation

## 2011-11-03 DIAGNOSIS — I4891 Unspecified atrial fibrillation: Secondary | ICD-10-CM

## 2011-11-03 DIAGNOSIS — M79606 Pain in leg, unspecified: Secondary | ICD-10-CM

## 2011-11-03 DIAGNOSIS — L309 Dermatitis, unspecified: Secondary | ICD-10-CM

## 2011-11-03 DIAGNOSIS — N183 Chronic kidney disease, stage 3 unspecified: Secondary | ICD-10-CM

## 2011-11-03 HISTORY — DX: Chronic kidney disease, stage 3 (moderate): N18.3

## 2011-11-03 HISTORY — DX: Chronic kidney disease, stage 3 unspecified: N18.30

## 2011-11-03 LAB — CBC WITH DIFFERENTIAL/PLATELET
Basophils Relative: 0 % (ref 0–1)
Eosinophils Absolute: 0.4 10*3/uL (ref 0.0–0.7)
HCT: 39.3 % (ref 39.0–52.0)
Hemoglobin: 14 g/dL (ref 13.0–17.0)
Lymphs Abs: 2.1 10*3/uL (ref 0.7–4.0)
MCH: 35.3 pg — ABNORMAL HIGH (ref 26.0–34.0)
MCHC: 35.6 g/dL (ref 30.0–36.0)
Monocytes Absolute: 0.6 10*3/uL (ref 0.1–1.0)
Monocytes Relative: 8 % (ref 3–12)
Neutro Abs: 4.1 10*3/uL (ref 1.7–7.7)
RBC: 3.97 MIL/uL — ABNORMAL LOW (ref 4.22–5.81)

## 2011-11-03 LAB — URINE MICROSCOPIC-ADD ON

## 2011-11-03 LAB — POCT I-STAT, CHEM 8
BUN: 26 mg/dL — ABNORMAL HIGH (ref 6–23)
Chloride: 105 mEq/L (ref 96–112)
Creatinine, Ser: 1.7 mg/dL — ABNORMAL HIGH (ref 0.50–1.35)
Glucose, Bld: 103 mg/dL — ABNORMAL HIGH (ref 70–99)
Hemoglobin: 15 g/dL (ref 13.0–17.0)
Potassium: 4.4 mEq/L (ref 3.5–5.1)
Sodium: 140 mEq/L (ref 135–145)

## 2011-11-03 LAB — URINALYSIS, ROUTINE W REFLEX MICROSCOPIC
Bilirubin Urine: NEGATIVE
Glucose, UA: NEGATIVE mg/dL
Hgb urine dipstick: NEGATIVE
Specific Gravity, Urine: 1.013 (ref 1.005–1.030)
Urobilinogen, UA: 1 mg/dL (ref 0.0–1.0)

## 2011-11-03 MED ORDER — CLOBETASOL PROPIONATE 0.05 % EX CREA
TOPICAL_CREAM | Freq: Two times a day (BID) | CUTANEOUS | Status: DC
  Administered 2011-11-03 – 2011-11-05 (×4): via TOPICAL
  Filled 2011-11-03: qty 15

## 2011-11-03 MED ORDER — HYDROCODONE-ACETAMINOPHEN 5-325 MG PO TABS
1.0000 | ORAL_TABLET | ORAL | Status: DC | PRN
  Administered 2011-11-03: 1 via ORAL
  Administered 2011-11-04: 2 via ORAL
  Administered 2011-11-04: 1 via ORAL
  Filled 2011-11-03 (×2): qty 2
  Filled 2011-11-03: qty 1

## 2011-11-03 MED ORDER — DICLOFENAC SODIUM 1 % TD GEL
1.0000 "application " | Freq: Four times a day (QID) | TRANSDERMAL | Status: DC
  Filled 2011-11-03: qty 100

## 2011-11-03 MED ORDER — IRBESARTAN 150 MG PO TABS
150.0000 mg | ORAL_TABLET | Freq: Every day | ORAL | Status: DC
  Administered 2011-11-03 – 2011-11-05 (×3): 150 mg via ORAL
  Filled 2011-11-03 (×3): qty 1

## 2011-11-03 MED ORDER — DICLOFENAC SODIUM 1 % TD GEL
4.0000 g | Freq: Four times a day (QID) | TRANSDERMAL | Status: DC
  Administered 2011-11-03 – 2011-11-05 (×6): 4 g via TOPICAL

## 2011-11-03 MED ORDER — HYDROCHLOROTHIAZIDE 25 MG PO TABS
25.0000 mg | ORAL_TABLET | Freq: Every day | ORAL | Status: DC
  Administered 2011-11-03 – 2011-11-05 (×3): 25 mg via ORAL
  Filled 2011-11-03 (×3): qty 1

## 2011-11-03 MED ORDER — OXYCODONE-ACETAMINOPHEN 5-325 MG PO TABS
1.0000 | ORAL_TABLET | Freq: Once | ORAL | Status: AC
  Administered 2011-11-03: 1 via ORAL
  Filled 2011-11-03: qty 1

## 2011-11-03 MED ORDER — CIPROFLOXACIN HCL 500 MG PO TABS
500.0000 mg | ORAL_TABLET | Freq: Two times a day (BID) | ORAL | Status: DC
  Administered 2011-11-03 – 2011-11-05 (×4): 500 mg via ORAL
  Filled 2011-11-03 (×6): qty 1

## 2011-11-03 MED ORDER — HYDROCORTISONE 1 % EX CREA
TOPICAL_CREAM | Freq: Two times a day (BID) | CUTANEOUS | Status: DC
  Administered 2011-11-03 – 2011-11-04 (×2): via TOPICAL
  Filled 2011-11-03: qty 28

## 2011-11-03 MED ORDER — AMLODIPINE BESYLATE 5 MG PO TABS
5.0000 mg | ORAL_TABLET | Freq: Every day | ORAL | Status: DC
  Administered 2011-11-03 – 2011-11-05 (×3): 5 mg via ORAL
  Filled 2011-11-03 (×3): qty 1

## 2011-11-03 MED ORDER — PANTOPRAZOLE SODIUM 40 MG PO TBEC
40.0000 mg | DELAYED_RELEASE_TABLET | Freq: Every day | ORAL | Status: DC
  Administered 2011-11-03 – 2011-11-05 (×3): 40 mg via ORAL
  Filled 2011-11-03 (×3): qty 1

## 2011-11-03 MED ORDER — ENOXAPARIN SODIUM 30 MG/0.3ML ~~LOC~~ SOLN
30.0000 mg | SUBCUTANEOUS | Status: DC
  Administered 2011-11-03 – 2011-11-04 (×2): 30 mg via SUBCUTANEOUS
  Filled 2011-11-03 (×3): qty 0.3

## 2011-11-03 MED ORDER — VALSARTAN-HYDROCHLOROTHIAZIDE 160-25 MG PO TABS: 1.0000 | ORAL_TABLET | Freq: Every evening | ORAL | Status: DC

## 2011-11-03 MED ORDER — SODIUM CHLORIDE 0.9 % IV SOLN
INTRAVENOUS | Status: DC
  Administered 2011-11-03 – 2011-11-04 (×2): via INTRAVENOUS

## 2011-11-03 NOTE — Progress Notes (Signed)
[facility] ED Cm noted CM consult Spoke with ED RN and EDP.  CM spoke with wife HOH with hearing aid in left ear She reports pt is also HOH.  Wife allowed to ventilate her feelings about pt's course of events leading to this ED visit Pt states pt went "to work on yesterday." Reports pt "drove" himself and pt's son, Link Snuffer called her to state pt was on his way home and was not doing well with his legs.  Wife and neighbors assisted pt to house using his "walker"  Wife reports pt has not been to Dr Anselm Jungling, pcp lately to be seen and she can not take him. Last time pt went to pcp was with assistance of neighbors getting him in the car.  Reports in "May" pt went to [facility] urgent care and was sent [facility] ED for his legs.  CM reviewed in details medicare guidelines for admission, Virgie guidelines for patients competent to make decisions, difference between observation and inpatient admission status related to snf placement, differences between private duty nursing, assisted living, skilled nursing facility and home health services Cm provided her with lists for local private duty nursing, assisted living, skilled nursing facility and home health services and reviewed some more differences.  Wife consulted with a neighbor but reports neighbor works for a Environmental health practitioner and not a Estate agent.  Wife consulted with her daughter in law and [facility].  Choices offered are as following HOME HEALTH AGENCIES SERVING [place]   Agencies that are Medicare-Certified and are affiliated with [facility] Agency  Telephone Number Address  [facility]   The [facility] has ownership interest in this company; however, you are under no obligation to use this agency. [phone] or  [phone] [address] [place], [place] [address] [email or web address]   Agencies that are Medicare-Certified and are not affiliated with [facility] Agency Telephone Number Address  [facility] [phone] Fax [phone] [address] [place], [place]  [address] [email or web address]  [facility] [phone] or [phone] Fax [phone] [address] [place], [place] [address] [email or web address]  [facility] [phone] Fax [phone] [address] [place], [place] [address] [email or web address]  [facility] [phone] Fax [phone] [address] [place], [place]  [address] [email or web address]  [facility] [phone] Fax [phone] [address], [address] [place], [place] [address] [email or web address]  [facility] [phone] [address] [place], [place] [address] [email or web address]  [facility] [phone]  [address] [place], [place] [address] [email or web address]  [facility] [phone] or [phone] Fax [phone] [address] [place], [place]  [address] [email or web address]  [facility] [phone] Fax [phone] [address] [place], [place]  [address]  [facility]  [phone] Fax [phone] [address] [place], [place] [address] [email or web address]

## 2011-11-03 NOTE — ED Notes (Signed)
Pt comes in with right leg and calf pain for a month. Wife gave him 3 tylenol before bed but it did not help the pain. Pt did not have any injuries or falls. No pain on palpation but does feel pain when he stands.
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
## 2011-11-03 NOTE — ED Notes (Signed)
ZOX:[id number]<AV> Expected date:11/03/11<BR> Expected time: 6:40 AM<BR> Means of arrival:Ambulance<BR> Comments:<BR> Leg pain
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
## 2011-11-03 NOTE — Progress Notes (Signed)
Right:  No evidence of DVT or superficial thrombosis.  There appears to be a Baker's cyst.  Left:  Negative for DVT in the common femoral vein.  

## 2011-11-03 NOTE — Progress Notes (Signed)
[name] Page, is a 77 y.o. male,   MRN: [id number]  -  DOB - June 17, 1924  Outpatient Primary MD for the patient is HO,MICHELE, MD  in for    Chief Complaint  Patient presents with  . Leg Pain     Blood pressure 123/90, pulse 114, temperature 97.5 F (36.4 C), temperature source Oral, resp. rate 16, SpO2 100.00%.  Principal Problem:  *UTI (lower urinary tract infection) Active Problems:  ESSENTIAL HYPERTENSION  Atrial fibrillation  RENAL INSUFFICIENCY  Knee pain, right  [name] 21 you hx htn, afib, presents today with cc right calf pain and frequent urination. Denies recent fall or trauma to leg. Reports has had this pain for several weeks but today worsened to point unable to bear weight. Denies back pain, numbness/tingling of LE. In Ed duplex no evidence of DVT. +Baker's cyst. Pt unable to stand/walk in ED.  states she cannot take home. PCP Dr. Anselm Jungling  In ED VSS, no WC urine +UTI.   Case management consulted with ED MD as wife indicates that she cannot take pt home. Reports progressive weakness herself over last few weeks and is unable to care for pt. Decision to admit for obs for pain and UTI. Anticipate discharge in am.   Admit to [facility]

## 2011-11-04 DIAGNOSIS — Z7289 Other problems related to lifestyle: Secondary | ICD-10-CM

## 2011-11-04 LAB — CBC
HCT: 36.8 % — ABNORMAL LOW (ref 39.0–52.0)
MCH: 34.4 pg — ABNORMAL HIGH (ref 26.0–34.0)
MCV: 100.5 fL — ABNORMAL HIGH (ref 78.0–100.0)
RDW: 12.2 % (ref 11.5–15.5)
WBC: 6.7 10*3/uL (ref 4.0–10.5)

## 2011-11-04 LAB — COMPREHENSIVE METABOLIC PANEL
ALT: 16 U/L (ref 0–53)
AST: 20 U/L (ref 0–37)
Albumin: 3.3 g/dL — ABNORMAL LOW (ref 3.5–5.2)
Alkaline Phosphatase: 81 U/L (ref 39–117)
Chloride: 101 mEq/L (ref 96–112)
Potassium: 4 mEq/L (ref 3.5–5.1)
Sodium: 136 mEq/L (ref 135–145)
Total Bilirubin: 0.8 mg/dL (ref 0.3–1.2)
Total Protein: 6.5 g/dL (ref 6.0–8.3)

## 2011-11-04 LAB — HEMOGLOBIN A1C: Hgb A1c MFr Bld: 5.5 % (ref <5.7)

## 2011-11-04 LAB — TSH: TSH: 1.901 u[IU]/mL (ref 0.350–4.500)

## 2011-11-04 NOTE — Progress Notes (Signed)
Clinical Social Work Department BRIEF PSYCHOSOCIAL ASSESSMENT 11/04/2011  Patient:  [name] Page, [name] Page     Account Number:  [id number]     Admit date:  11/03/2011  Clinical Social Worker:  Leron Croak, CLINICAL SOCIAL WORKER  Date/Time:  11/04/2011 03:31 PM  Referred by:  Physician  Date Referred:  11/04/2011 Referred for  Advanced Directives   Other Referral:   Interview type:  Patient Other interview type:   Spoke with wife via phone.    PSYCHOSOCIAL DATA Living Status:  WIFE Admitted from facility:   Level of care:   Primary support name:  Laurine Blazer Primary support relationship to patient:  SPOUSE Degree of support available:   good    CURRENT CONCERNS Current Concerns  Other - See comment   Other Concerns:   Advance Directive information    SOCIAL WORK ASSESSMENT / PLAN CSW spoke witht the Pt briefly. Pt has difficulty hearing and was unable to communicate fully. CSW contacted wife [name]) to inform her that the information was provided at the bedside.   Assessment/plan status:  Information/Referral to [facility] Other assessment/ plan:   Information/referral to community resources:   Advance Directive packet was provided at the bedside so as to be available to wife upon return to hospital.    PATIENT'S/FAMILY'S RESPONSE TO PLAN OF CARE: Wife and Pt was appreciative for information packet.       Leron Croak, LCSWA [employer] Coverage [phone]

## 2011-11-04 NOTE — Progress Notes (Signed)
[facility] PROGRESS NOTE  [name] Page WUJ:[id number] DOB: April 18, 1924 DOA: 11/03/2011 PCP: Carrolyn Meiers, MD  Assessment/Plan: Principal Problem:  *UTI (lower urinary tract infection) Active Problems:  ESSENTIAL HYPERTENSION  Atrial fibrillation  Knee pain, right  CKD (chronic kidney disease), stage III   UTI  Urinalysis is consistent with UTI in the emergency department, patient started on ciprofloxacin 500 mg twice a day. Urine culture was sent and we will adjust antibiotic according to the culture results.   Right knee pain  Patient has long history of arthritis, from the Doppler ultrasound findings consistent with Baker's cysts. Patient continued to have pain in his popliteal fossa. Continue pain management.  CKD stage III  Patient creatinine baseline about 1.6, creatinine now is 1.7. Will follow BMP closely.   Atrial fibrillation  No symptoms of palpitations, no EKG done. According to the records he is not a Coumadin candidate secondary to previous GI bleed, multiple falls and history of subdural hematoma.   Code Status: Full code Family Communication: Talked with his spouse, questions answered Disposition Plan: Likely to be discharged in the morning   Brief narrative: 78 year old Caucasian male came in to the hospital complaining about right lower extremity especially right knee pain, and able to ambulate. Patient also has UTI. Right lower extremity Doppler showed Baker's cyst.  Consultants:  None  Procedures:  Right lower extremity Dopplers on 11/03/2011 showed Baker's cyst  Antibiotics:  Ciprofloxacin started on 11/03/2011  HPI/Subjective: Patient is very hard of hearing, still has some pain but improved  Objective: Filed Vitals:   11/03/11 2106 11/03/11 2230 11/04/11 0500 11/04/11 1415  BP: 129/91 118/77 111/68 106/69  Pulse: 94 79 85 97  Temp:  97.9 F (36.6 C) 98.3 F (36.8 C) 97.4 F (36.3 C)  TempSrc:  Oral Oral Oral  Resp:  18 18 18     Height:      Weight:      SpO2: 94% 100% 95% 96%    Intake/Output Summary (Last 24 hours) at 11/04/11 1551 Last data filed at 11/04/11 1430  Gross per 24 hour  Intake 1751.42 ml  Output    250 ml  Net 1501.42 ml   Filed Weights   11/03/11 1718  Weight: 83.416 kg (183 lb 14.4 oz)    Exam: General: Alert and awake, oriented x3, not in any acute distress. HEENT: anicteric sclera, pupils reactive to light and accommodation, EOMI CVS: S1-S2 clear, no murmur rubs or gallops Chest: clear to auscultation bilaterally, no wheezing, rales or rhonchi Abdomen: soft nontender, nondistended, normal bowel sounds, no organomegaly Extremities: no cyanosis, clubbing or edema noted bilaterally Neuro: Cranial nerves II-XII intact, no focal neurological deficits  Data Reviewed: Basic Metabolic Panel:  Lab 11/04/11 9562 11/03/11 1328  NA 136 140  K 4.0 4.4  CL 101 105  CO2 26 --  GLUCOSE 95 103*  BUN 26* 26*  CREATININE 1.79* 1.70*  CALCIUM 9.0 --  MG -- --  PHOS -- --   Liver Function Tests:  Lab 11/04/11 0500  AST 20  ALT 16  ALKPHOS 81  BILITOT 0.8  PROT 6.5  ALBUMIN 3.3*   No results found for this basename: LIPASE:5,AMYLASE:5 in the last 168 hours No results found for this basename: AMMONIA:5 in the last 168 hours CBC:  Lab 11/04/11 0500 11/03/11 1328 11/03/11 1254  WBC 6.7 -- 7.3  NEUTROABS -- -- 4.1  HGB 12.6* 15.0 14.0  HCT 36.8* 44.0 39.3  MCV 100.5* -- 99.0  PLT 186 --  188   Cardiac Enzymes: No results found for this basename: CKTOTAL:5,CKMB:5,CKMBINDEX:5,TROPONINI:5 in the last 168 hours BNP (last 3 results) No results found for this basename: PROBNP:3 in the last 8760 hours CBG: No results found for this basename: GLUCAP:5 in the last 168 hours  No results found for this or any previous visit (from the past 240 hour(s)).   Studies: No results found.  Scheduled Meds:   . amLODipine  5 mg Oral Daily  . ciprofloxacin  500 mg Oral BID  . clobetasol  cream   Topical BID  . diclofenac sodium  4 g Topical QID  . enoxaparin (LOVENOX) injection  30 mg Subcutaneous Q24H  . irbesartan  150 mg Oral Daily   And  . hydrochlorothiazide  25 mg Oral Daily  . hydrocortisone cream   Topical BID  . pantoprazole  40 mg Oral Q1200  . DISCONTD: diclofenac sodium  1 application Topical QID  . DISCONTD: valsartan-hydrochlorothiazide  1 tablet Oral QPM   Continuous Infusions:   . sodium chloride 75 mL/hr at 11/04/11 0536    Principal Problem:  *UTI (lower urinary tract infection) Active Problems:  ESSENTIAL HYPERTENSION  Atrial fibrillation  Knee pain, right  CKD (chronic kidney disease), stage III    Time spent: 35 minutes    [facility] A  [facility] Pager [phone]. If 8PM-8AM, please contact night-coverage at [email or web address], password [facility] 11/04/2011, 3:51 PM  LOS: 1 day

## 2011-11-05 LAB — BASIC METABOLIC PANEL
BUN: 27 mg/dL — ABNORMAL HIGH (ref 6–23)
Calcium: 9 mg/dL (ref 8.4–10.5)
Chloride: 103 mEq/L (ref 96–112)
Creatinine, Ser: 1.8 mg/dL — ABNORMAL HIGH (ref 0.50–1.35)
GFR calc Af Amer: 38 mL/min — ABNORMAL LOW (ref >90)

## 2011-11-05 MED ORDER — OXYCODONE-ACETAMINOPHEN 5-325 MG PO TABS: 1.0000 | ORAL_TABLET | ORAL | Status: AC | PRN

## 2011-11-05 MED ORDER — ENOXAPARIN SODIUM 40 MG/0.4ML ~~LOC~~ SOLN
40.0000 mg | Freq: Every day | SUBCUTANEOUS | Status: DC
  Filled 2011-11-05: qty 0.4

## 2011-11-05 MED ORDER — CIPROFLOXACIN HCL 500 MG PO TABS: 500.0000 mg | ORAL_TABLET | Freq: Two times a day (BID) | ORAL | Status: AC

## 2011-11-05 NOTE — Discharge Summary (Signed)
Physician Discharge Summary  [name] Page [id number]:[id number] DOB: [birth year] DOA: 11/03/2011  PCP: Carrolyn Meiers, MD  Patient sees the internal medicine teaching service outpatient clinic.  Admit date: 11/03/2011 Discharge date: 11/05/2011  Recommendations for Outpatient Follow-up:  Followup with primary care physician in 1-2 weeks  Discharge Diagnoses:  Principal Problem:  *UTI (lower urinary tract infection) Active Problems:  ESSENTIAL HYPERTENSION  Atrial fibrillation  Knee pain, right  CKD (chronic kidney disease), stage III   Discharge Condition: Fair  Diet recommendation: Stable  Filed Weights   11/03/11 1718  Weight: 83.416 kg (183 lb 14.4 oz)    History of present illness:  [name] Page is 78 year old Caucasian male with past medical history of upper GI bleed, hypertension and atrial fibrillation. [name] Page came into the hospital initially because of right lower extremity pain, but he couldn't bear weight and he has UTI so he was admitted to the hospital for further evaluation. Patient has a long history of arthritis, he said for the past 2 weeks he has pain in his right lower extremity, since yesterday he couldn't walk or bear weight because of the pain. The pain is around his right knee, and extends to the back of his right calf. He also had some tenderness, denies any fever or chills. He was evaluated in the emergency department by Doppler ultrasound, the preliminary report shows probable Baker's cyst, patient also has UTI and cause of the pain and inability to walk he will be admitted for observation.   Hospital Course:   1. UTI: Upon initial evaluation in the emergency department, patient urinalysis was consistent with UTI. Patient was not having a lot of symptoms on and started on ciprofloxacin 500 mg twice a day. Patient felt much better prior to discharge, was discharged on same antibiotic to complete 7 days of antibiotics. Urine culture results are pending at  the time of discharge.  2. Right knee pain: Patient has long-standing history of arthritis. He is complaining about right knee pain for quite a while, he also is complaining about calf pain. X-rays showed osteoarthritis, Doppler ultrasound was done and showed no evidence of DVT, there is evidence of Baker's cyst. Right knee pain was controlled with topical Voltaren and oral Percocet.  3. CKD stage III: Patient baseline creatinine is about 1.6, presented with creatinine of 1.7. Patient at the time of discharge his creatinine is 1.8. This is s needs regular followup as outpatient. Please note that patient is on ARB and HCTZ.  4. Atrial fibrillation: No symptoms of palpitation or chest pain, not EKG done this time. His heart rate is controlled, according to the records he is not a Coumadin candidate secondary to the previous GI bleed, multiple falls and history of subdural hematoma. Patient is on low dose ASA that is continued throughout the hospital stay.  Procedures:  None  Consultations:  None  Discharge Exam:  Filed Vitals:   11/04/11 0500 11/04/11 1415 11/04/11 2200 11/05/11 0628  BP: 111/68 106/69 91/58 130/79  Pulse: 85 97 132 92  Temp: 98.3 F (36.8 C) 97.4 F (36.3 C) 98.5 F (36.9 C) 97.5 F (36.4 C)  TempSrc: Oral Oral Oral Oral  Resp: 18 18 20 18   Height:      Weight:      SpO2: 95% 96% 91% 96%   General: Alert and awake, oriented x3, not in any acute distress. HEENT: anicteric sclera, pupils reactive to light and accommodation, EOMI CVS: S1-S2 clear, no murmur rubs or gallops Chest:  clear to auscultation bilaterally, no wheezing, rales or rhonchi Abdomen: soft nontender, nondistended, normal bowel sounds, no organomegaly Extremities: no cyanosis, clubbing or edema noted bilaterally Neuro: Cranial nerves II-XII intact, no focal neurological deficits  Discharge Instructions  Discharge Orders    Future Orders Please Complete By Expires   Diet - low sodium heart  healthy      Increase activity slowly        Medication List  As of 11/05/2011 12:47 PM   TAKE these medications         acetaminophen 500 MG tablet   Commonly known as: TYLENOL   Take 1,500 mg by mouth every 6 (six) hours as needed. For pain      amLODipine 5 MG tablet   Commonly known as: NORVASC   Take 1 tablet (5 mg total) by mouth daily.      ciprofloxacin 500 MG tablet   Commonly known as: CIPRO   Take 1 tablet (500 mg total) by mouth 2 (two) times daily.      clobetasol cream 0.05 %   Commonly known as: TEMOVATE   Apply topically 2 (two) times daily. Apply to both legs only, avoid face, axilla, flexural, inguinal areas      desonide 0.05 % gel   Commonly known as: DESONATE   Apply topically 2 (two) times daily. Apply to areas behind ears two times daily      diclofenac sodium 1 % Gel   Commonly known as: VOLTAREN   Apply 1 application topically 4 (four) times daily. Apply to right knee 4 times daily for knee pain      omeprazole 40 MG capsule   Commonly known as: PRILOSEC   Take 1 capsule (40 mg total) by mouth daily.      oxyCODONE-acetaminophen 5-325 MG per tablet   Commonly known as: PERCOCET/ROXICET   Take 1 tablet by mouth every 4 (four) hours as needed for pain.      valsartan-hydrochlorothiazide 160-25 MG per tablet   Commonly known as: DIOVAN-HCT   Take 1 tablet by mouth every evening.              The results of significant diagnostics from this hospitalization (including imaging, microbiology, ancillary and laboratory) are listed below for reference.    Significant Diagnostic Studies: No results found.  Microbiology: Recent Results (from the past 240 hour(s))  URINE CULTURE     Status: Normal (Preliminary result)   Collection Time   11/03/11  5:23 PM      Component Value Range Status Comment   Specimen Description URINE, CLEAN CATCH   Final    Special Requests NONE   Final    Culture  Setup Time 11/03/2011 22:37   Final    Colony Count  >=100,000 COLONIES/ML   Final    Culture ENTEROCOCCUS SPECIES   Final    Report Status PENDING   Incomplete      Labs: Basic Metabolic Panel:  Lab 11/05/11 5284 11/04/11 0500 11/03/11 1328  NA 136 136 140  K 3.8 4.0 4.4  CL 103 101 105  CO2 26 26 --  GLUCOSE 93 95 103*  BUN 27* 26* 26*  CREATININE 1.80* 1.79* 1.70*  CALCIUM 9.0 9.0 --  MG -- -- --  PHOS -- -- --   Liver Function Tests:  Lab 11/04/11 0500  AST 20  ALT 16  ALKPHOS 81  BILITOT 0.8  PROT 6.5  ALBUMIN 3.3*   No results found for this  basename: LIPASE:5,AMYLASE:5 in the last 168 hours No results found for this basename: AMMONIA:5 in the last 168 hours CBC:  Lab 11/04/11 0500 11/03/11 1328 11/03/11 1254  WBC 6.7 -- 7.3  NEUTROABS -- -- 4.1  HGB 12.6* 15.0 14.0  HCT 36.8* 44.0 39.3  MCV 100.5* -- 99.0  PLT 186 -- 188   Cardiac Enzymes: No results found for this basename: CKTOTAL:5,CKMB:5,CKMBINDEX:5,TROPONINI:5 in the last 168 hours BNP: BNP (last 3 results) No results found for this basename: PROBNP:3 in the last 8760 hours CBG: No results found for this basename: GLUCAP:5 in the last 168 hours  Time coordinating discharge: 40 minutes  Signed:  Allegra Cerniglia A  [facility] 11/05/2011, 12:47 PM

## 2011-11-06 LAB — URINE CULTURE

## 2011-11-07 ENCOUNTER — Telehealth: Payer: Self-pay | Admitting: Internal Medicine

## 2011-11-07 NOTE — Telephone Encounter (Signed)
Encounter created by mistake

## 2011-11-22 ENCOUNTER — Ambulatory Visit (HOSPITAL_COMMUNITY)
Admission: RE | Admit: 2011-11-22 | Discharge: 2011-11-22 | Disposition: A | Discharge status: Home / Self Care | Payer: Medicare Other | Source: Ambulatory Visit | Attending: Internal Medicine | Admitting: Internal Medicine

## 2011-11-22 ENCOUNTER — Ambulatory Visit (INDEPENDENT_AMBULATORY_CARE_PROVIDER_SITE_OTHER): Payer: Medicare Other | Admitting: Internal Medicine

## 2011-11-22 ENCOUNTER — Encounter: Payer: Self-pay | Admitting: Internal Medicine

## 2011-11-22 VITALS — BP 133/85 | HR 68 | Temp 97.3°F | Ht 72.0 in | Wt 175.3 lb

## 2011-11-22 DIAGNOSIS — M1710 Unilateral primary osteoarthritis, unspecified knee: Secondary | ICD-10-CM | POA: Insufficient documentation

## 2011-11-22 DIAGNOSIS — N390 Urinary tract infection, site not specified: Secondary | ICD-10-CM

## 2011-11-22 DIAGNOSIS — M899 Disorder of bone, unspecified: Secondary | ICD-10-CM | POA: Insufficient documentation

## 2011-11-22 DIAGNOSIS — I70209 Unspecified atherosclerosis of native arteries of extremities, unspecified extremity: Secondary | ICD-10-CM | POA: Insufficient documentation

## 2011-11-22 DIAGNOSIS — I10 Essential (primary) hypertension: Secondary | ICD-10-CM

## 2011-11-22 DIAGNOSIS — M949 Disorder of cartilage, unspecified: Secondary | ICD-10-CM | POA: Insufficient documentation

## 2011-11-22 DIAGNOSIS — Z23 Encounter for immunization: Secondary | ICD-10-CM

## 2011-11-22 DIAGNOSIS — M25569 Pain in unspecified knee: Secondary | ICD-10-CM

## 2011-11-22 DIAGNOSIS — M25561 Pain in right knee: Secondary | ICD-10-CM

## 2011-11-22 DIAGNOSIS — I4891 Unspecified atrial fibrillation: Secondary | ICD-10-CM

## 2011-11-22 DIAGNOSIS — L309 Dermatitis, unspecified: Secondary | ICD-10-CM

## 2011-11-22 DIAGNOSIS — L259 Unspecified contact dermatitis, unspecified cause: Secondary | ICD-10-CM

## 2011-11-22 DIAGNOSIS — I129 Hypertensive chronic kidney disease with stage 1 through stage 4 chronic kidney disease, or unspecified chronic kidney disease: Secondary | ICD-10-CM

## 2011-11-22 DIAGNOSIS — R350 Frequency of micturition: Secondary | ICD-10-CM

## 2011-11-22 LAB — BASIC METABOLIC PANEL WITH GFR
CO2: 25 mEq/L (ref 19–32)
Calcium: 9.4 mg/dL (ref 8.4–10.5)
Creat: 1.62 mg/dL — ABNORMAL HIGH (ref 0.50–1.35)
GFR, Est African American: 44 mL/min — ABNORMAL LOW
GFR, Est Non African American: 38 mL/min — ABNORMAL LOW
Glucose, Bld: 95 mg/dL (ref 70–99)

## 2011-11-22 MED ORDER — HYDROCORTISONE BUTYRATE 0.1 % EX CREA: TOPICAL_CREAM | CUTANEOUS | Status: DC

## 2011-11-22 NOTE — Assessment & Plan Note (Signed)
Patient has atopic dermatitis post auricular as well as lower extremity. Patient has been using Temovate cream which helped. -Will give Locoid cream to apply post auricula -Continue Temovate cream for lower extremity -Patient also has 2 actinic keratosis on his face and I recommended that he see a dermatologist for further evaluation and treatment. Patient's wife will call Dr. Scharlene Gloss office to make appointment

## 2011-11-22 NOTE — Assessment & Plan Note (Signed)
Urine culture in August 2013 show enterococcus greater than 100,000 colonies. Patient was sent home with 7 day course of Cipro in which she did finish however patient's wife reported that he has increasing urinary frequency but denies any dysuria or burning sensation. We will repeat a urinalysis and urine culture today and will treat accordingly once we have the results

## 2011-11-22 NOTE — Addendum Note (Signed)
Addended by: Angelina Ok F on: 11/22/2011 11:38 AM   Modules accepted: Orders

## 2011-11-22 NOTE — Assessment & Plan Note (Signed)
Rate control. Physical exam showed irregularly irregular rhythm. Patient is not a Coumadin candidate because of recurrent fall and GI bleed in the past. Will continue aspirin

## 2011-11-22 NOTE — Assessment & Plan Note (Signed)
At baseline but will repeat BMP today.

## 2011-11-22 NOTE — Progress Notes (Signed)
HPI: Mr. Colberg is an 78 yo M with PMH of atrial fib, GI bleed, GERD, HTN, CVA, CKD with baseline 1.6-2 presents today for hospital follow up.  Patient went to [facility] for right knee pain and UTI on 11/04/11. He was sent home with Cipro for 7 days course in which he completed and his urine culture showed enterococcus >100,000 colonies and it was sensitive to Cipro.  He has right knee pain x3 months in duration and pain wakes him up at night.  Has increased in urinary frequency but denies any dysuria or burning sensation. He was also seen by Dr. Annalee Genta about 6 months ago with ENT for cerumen impaction, denies any ear pain.  ROS: as per HPI  PE; General: alert, well-developed, and cooperative to examination.   Lungs: normal respiratory effort, no accessory muscle use, normal breath sounds, no crackles, and no wheezes. Heart: irregularly irregular, no murmur, no gallop, and no rub.  Abdomen: soft, non-tender, normal bowel sounds, no distention, no guarding, no rebound tenderness Msk: no joint swelling, no joint warmth, and no redness over joints.  Pulses: 2+ DP/PT pulses bilaterally Extremities: No cyanosis, clubbing, edema.  Right knee: tenderness with passive movement but no effusion or erythema noted. Very mild crepitus noted.   Neurologic: nonfocal Skin: turgor normal and + diffused maculopapular lesions on his LE but improved since last visit. New post auricular erythematous patchy of right ear. No pustular drainage. 2 actinic keratosis on face ~ 0.5-1cm in diameter

## 2011-11-22 NOTE — Assessment & Plan Note (Signed)
Likely osteoarthritis. When he was admitted to the hospital in 10/2011, a venous Doppler of lower extremity was performed and did not show any DVT; however, there was a right incidental Baker's cyst. Will get knee x-ray for further evaluation. In the meantime we'll continue Tylenol when necessary for pain. Patient reports that he was given oxycodone from the hospital and his face turned red whenever she gave him the oxycodone therefore she has not been given him this medication.

## 2011-11-22 NOTE — Patient Instructions (Addendum)
You may apply Locoid cream to affected area twice daily  Will get labs and Xray of right knee today and I will call you with any abnormal lab results Follow up in 2-3 months, sooner if needed

## 2011-11-22 NOTE — Assessment & Plan Note (Signed)
Well-controlled. Will continue current medication. We will be cautious with his ARB and hydrochlorothiazide given his CK D.  -Will check a BMP today.

## 2011-11-23 LAB — URINALYSIS, ROUTINE W REFLEX MICROSCOPIC
Bilirubin Urine: NEGATIVE
Glucose, UA: NEGATIVE mg/dL
Hgb urine dipstick: NEGATIVE
Protein, ur: NEGATIVE mg/dL
Urobilinogen, UA: 0.2 mg/dL (ref 0.0–1.0)

## 2012-04-16 ENCOUNTER — Encounter: Payer: Self-pay | Admitting: Licensed Clinical Social Worker

## 2012-04-16 ENCOUNTER — Ambulatory Visit (INDEPENDENT_AMBULATORY_CARE_PROVIDER_SITE_OTHER): Payer: Medicare Other | Admitting: Internal Medicine

## 2012-04-16 ENCOUNTER — Encounter: Payer: Self-pay | Admitting: Internal Medicine

## 2012-04-16 VITALS — BP 109/70 | HR 106 | Temp 96.4°F | Wt 180.9 lb

## 2012-04-16 DIAGNOSIS — M25569 Pain in unspecified knee: Secondary | ICD-10-CM

## 2012-04-16 DIAGNOSIS — M25561 Pain in right knee: Secondary | ICD-10-CM

## 2012-04-16 MED ORDER — LIDOCAINE 5 % EX PTCH: 1.0000 | MEDICATED_PATCH | CUTANEOUS | Status: DC

## 2012-04-16 MED ORDER — TRAMADOL HCL 50 MG PO TABS: 50.0000 mg | ORAL_TABLET | Freq: Three times a day (TID) | ORAL | Status: DC | PRN

## 2012-04-16 NOTE — Progress Notes (Signed)
CSW met with Mr. Lindfors, spouse and private duty aide.  Spouse inquiring about facility options as pt is having more difficulty in the home.  CSW discussed the differences between Medicare covered nursing facility and assisted living options.  CSW provided spouse with listing of in-network [facility] as current option for a short term placement/respite based on insurance coverage.  Encouraged spouse to visit facilities and inquire about UHC benefits for short term placement.  Discussed long term option of looking into assisted living facilities, provided listing of ALF in [place]  Mr. Centola has not completed Advanced Directives, encouraged spouse to speak with an Dentist to obtain [employer], Designer, industrial/product and [employer].  Spouse states financial planner offered referral.  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  Provided listing of [facility] SNF and ALF in [place]  Spouse inquiring if pt should still be going in to work.  Spouse complains pt is up and dressed at 6:30am.  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  CSW encourage spouse to supportive Mr. Zwick going into the office.  If pt is safe and obtains a sense of satisfaction from sitting at computer at business location, then empower pt with space.  Spouse has CSW contact information, denies add'l needs at this time.

## 2012-04-16 NOTE — Patient Instructions (Addendum)
You can take Ultram 50mg  1 tablet at bedtime to help with your knee pain (but can take up to 3 tablets daily as needed).   You can also try Lidoderm patch once daily for pain as well Follow up in 1 month if no improvement

## 2012-04-16 NOTE — Assessment & Plan Note (Addendum)
Knee Xray in 11/2011 showed severe tricompartmental OA of right knee.  We discussed surgical option and wife does not think he will tolerate the procedure. She has been giving him Tylenol and Voltaren gel (helps a lot) but he is mainly in pain at night or when he tries to walk.  Given his CKD III, our option is limited because we cannot use NSAIDs.  And also he is elderly and several falls, so I would like to avoid narcotics at this time because it would only make him more drowsy.  I suggested that he takes Ultram (renally dosed) 1 tablet at bedtime to see if that will help alleviate his pain.  If he is also in pain, he may use up to 3 tablets total of Ultram 50mg  daily PRN in addition to his Voltaren gel and tylenol.  We will also try Lidoderm patch if his insurance pays for it.  Follow up in 1 month if no improvement.  May have to escalate to weak narcotic such as hydrocodone if pain is still not adequately controlled. Case discussed with attending, Dr. Kem Kays.   -wife would like to speak to [facility] about personal care aid but patient is actually not qualified according to Shana-SW.

## 2012-04-16 NOTE — Progress Notes (Signed)
Patient ID: [name] Page, male   DOB: 04/14/1924, 79 y.o.   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 This is a chronic problem for patient and he did have knee xray in 11/2011 which showed severe tricompartmental OA.  His wife has been giving him Tylenol and voltaren gel. He states that Voltaren gel helps him a lot with the knee pain.  He would scream at night because of his knee pain and has pain when he tries to walk.    ROS: as per HPI  PE: General: alert, well-developed, and cooperative to examination.  Lungs: normal respiratory effort, no accessory muscle use, normal breath sounds, no crackles, and no wheezes. Heart: irregularly irregular, no murmur, no gallop, and no rub.  Abdomen: soft, non-tender, normal bowel sounds, no distention, no guarding, no rebound tenderness EXT: right knee: +crepitus, limited ROM due to pain. No effusion noted or erythema . Patient was sitting in wheelchair Neurologic: nonfocal Skin: turgor normal and mild maculopapular rash on left lower leg.

## 2012-05-20 ENCOUNTER — Other Ambulatory Visit: Payer: Self-pay | Admitting: *Deleted

## 2012-05-20 DIAGNOSIS — K219 Gastro-esophageal reflux disease without esophagitis: Secondary | ICD-10-CM

## 2012-05-20 MED ORDER — OMEPRAZOLE 40 MG PO CPDR: 40.0000 mg | DELAYED_RELEASE_CAPSULE | Freq: Every day | ORAL | Status: DC

## 2012-05-28 ENCOUNTER — Other Ambulatory Visit: Payer: Self-pay | Admitting: *Deleted

## 2012-05-28 DIAGNOSIS — I10 Essential (primary) hypertension: Secondary | ICD-10-CM

## 2012-05-28 MED ORDER — AMLODIPINE BESYLATE 5 MG PO TABS: 5.0000 mg | ORAL_TABLET | Freq: Every day | ORAL | Status: DC

## 2012-05-28 NOTE — Telephone Encounter (Signed)
Rx called in to pharmacy. 

## 2012-06-18 ENCOUNTER — Encounter: Payer: Self-pay | Admitting: Internal Medicine

## 2012-06-18 DIAGNOSIS — R269 Unspecified abnormalities of gait and mobility: Secondary | ICD-10-CM | POA: Insufficient documentation

## 2012-08-06 ENCOUNTER — Other Ambulatory Visit: Payer: Self-pay | Admitting: *Deleted

## 2012-08-06 MED ORDER — VALSARTAN-HYDROCHLOROTHIAZIDE 160-25 MG PO TABS: 1.0000 | ORAL_TABLET | Freq: Every evening | ORAL | Status: DC

## 2012-08-24 ENCOUNTER — Encounter (HOSPITAL_COMMUNITY): Payer: Self-pay | Admitting: Emergency Medicine

## 2012-08-24 ENCOUNTER — Other Ambulatory Visit: Payer: Self-pay

## 2012-08-24 ENCOUNTER — Inpatient Hospital Stay (HOSPITAL_COMMUNITY)
Admission: EM | Admit: 2012-08-24 | Discharge: 2012-08-30 | DRG: 872 | Disposition: A | Discharge status: Skilled Nursing Facility | Payer: Medicare Other | Attending: Internal Medicine | Admitting: Internal Medicine

## 2012-08-24 ENCOUNTER — Inpatient Hospital Stay (HOSPITAL_COMMUNITY): Payer: Medicare Other

## 2012-08-24 ENCOUNTER — Emergency Department (HOSPITAL_COMMUNITY): Payer: Medicare Other

## 2012-08-24 DIAGNOSIS — D696 Thrombocytopenia, unspecified: Secondary | ICD-10-CM | POA: Diagnosis present

## 2012-08-24 DIAGNOSIS — K219 Gastro-esophageal reflux disease without esophagitis: Secondary | ICD-10-CM | POA: Diagnosis present

## 2012-08-24 DIAGNOSIS — E876 Hypokalemia: Secondary | ICD-10-CM | POA: Diagnosis present

## 2012-08-24 DIAGNOSIS — Z87891 Personal history of nicotine dependence: Secondary | ICD-10-CM

## 2012-08-24 DIAGNOSIS — I129 Hypertensive chronic kidney disease with stage 1 through stage 4 chronic kidney disease, or unspecified chronic kidney disease: Secondary | ICD-10-CM | POA: Diagnosis present

## 2012-08-24 DIAGNOSIS — K8043 Calculus of bile duct with acute cholecystitis with obstruction: Secondary | ICD-10-CM | POA: Diagnosis present

## 2012-08-24 DIAGNOSIS — R6510 Systemic inflammatory response syndrome (SIRS) of non-infectious origin without acute organ dysfunction: Secondary | ICD-10-CM | POA: Diagnosis present

## 2012-08-24 DIAGNOSIS — A4102 Sepsis due to Methicillin resistant Staphylococcus aureus: Principal | ICD-10-CM | POA: Diagnosis present

## 2012-08-24 DIAGNOSIS — R4781 Slurred speech: Secondary | ICD-10-CM | POA: Diagnosis present

## 2012-08-24 DIAGNOSIS — I10 Essential (primary) hypertension: Secondary | ICD-10-CM | POA: Diagnosis present

## 2012-08-24 DIAGNOSIS — I4891 Unspecified atrial fibrillation: Secondary | ICD-10-CM | POA: Diagnosis present

## 2012-08-24 DIAGNOSIS — I639 Cerebral infarction, unspecified: Secondary | ICD-10-CM

## 2012-08-24 DIAGNOSIS — N183 Chronic kidney disease, stage 3 unspecified: Secondary | ICD-10-CM | POA: Diagnosis present

## 2012-08-24 DIAGNOSIS — Z66 Do not resuscitate: Secondary | ICD-10-CM | POA: Diagnosis not present

## 2012-08-24 DIAGNOSIS — R269 Unspecified abnormalities of gait and mobility: Secondary | ICD-10-CM

## 2012-08-24 DIAGNOSIS — IMO0001 Reserved for inherently not codable concepts without codable children: Secondary | ICD-10-CM | POA: Diagnosis present

## 2012-08-24 DIAGNOSIS — K8040 Calculus of bile duct with cholecystitis, unspecified, without obstruction: Secondary | ICD-10-CM | POA: Diagnosis present

## 2012-08-24 DIAGNOSIS — A419 Sepsis, unspecified organism: Secondary | ICD-10-CM | POA: Diagnosis present

## 2012-08-24 DIAGNOSIS — H9190 Unspecified hearing loss, unspecified ear: Secondary | ICD-10-CM | POA: Diagnosis present

## 2012-08-24 DIAGNOSIS — N390 Urinary tract infection, site not specified: Secondary | ICD-10-CM | POA: Diagnosis present

## 2012-08-24 DIAGNOSIS — H6120 Impacted cerumen, unspecified ear: Secondary | ICD-10-CM | POA: Diagnosis present

## 2012-08-24 HISTORY — DX: Cerebral infarction, unspecified: I63.9

## 2012-08-24 LAB — LIPID PANEL
LDL Cholesterol: 84 mg/dL (ref 0–99)
Total CHOL/HDL Ratio: 3 RATIO
VLDL: 10 mg/dL (ref 0–40)

## 2012-08-24 LAB — POCT I-STAT, CHEM 8
Glucose, Bld: 121 mg/dL — ABNORMAL HIGH (ref 70–99)
HCT: 44 % (ref 39.0–52.0)
Hemoglobin: 15 g/dL (ref 13.0–17.0)
Potassium: 3.6 mEq/L (ref 3.5–5.1)
Sodium: 140 mEq/L (ref 135–145)
TCO2: 24 mmol/L (ref 0–100)

## 2012-08-24 LAB — CBC
HCT: 36.8 % — ABNORMAL LOW (ref 39.0–52.0)
Hemoglobin: 14.4 g/dL (ref 13.0–17.0)
Hemoglobin: 13.5 g/dL (ref 13.0–17.0)
MCH: 35.7 pg — ABNORMAL HIGH (ref 26.0–34.0)
MCH: 35.5 pg — ABNORMAL HIGH (ref 26.0–34.0)
MCHC: 36.7 g/dL — ABNORMAL HIGH (ref 30.0–36.0)
MCV: 97.3 fL (ref 78.0–100.0)
MCV: 96.8 fL (ref 78.0–100.0)
Platelets: 144 10*3/uL — ABNORMAL LOW (ref 150–400)
RBC: 4.03 MIL/uL — ABNORMAL LOW (ref 4.22–5.81)
RBC: 3.8 MIL/uL — ABNORMAL LOW (ref 4.22–5.81)
WBC: 15.6 10*3/uL — ABNORMAL HIGH (ref 4.0–10.5)

## 2012-08-24 LAB — RAPID URINE DRUG SCREEN, HOSP PERFORMED
Amphetamines: NOT DETECTED
Benzodiazepines: NOT DETECTED
Cocaine: NOT DETECTED
Opiates: NOT DETECTED

## 2012-08-24 LAB — URINALYSIS, ROUTINE W REFLEX MICROSCOPIC
Nitrite: NEGATIVE
Specific Gravity, Urine: 1.015 (ref 1.005–1.030)
Urobilinogen, UA: 2 mg/dL — ABNORMAL HIGH (ref 0.0–1.0)

## 2012-08-24 LAB — DIFFERENTIAL
Basophils Relative: 0 % (ref 0–1)
Eosinophils Absolute: 0.1 10*3/uL (ref 0.0–0.7)
Eosinophils Relative: 1 % (ref 0–5)
Lymphs Abs: 0.8 10*3/uL (ref 0.7–4.0)
Monocytes Relative: 9 % (ref 3–12)
Neutrophils Relative %: 84 % — ABNORMAL HIGH (ref 43–77)

## 2012-08-24 LAB — GLUCOSE, CAPILLARY
Glucose-Capillary: 120 mg/dL — ABNORMAL HIGH (ref 70–99)
Glucose-Capillary: 142 mg/dL — ABNORMAL HIGH (ref 70–99)

## 2012-08-24 LAB — PROTIME-INR
INR: 1.04 (ref 0.00–1.49)
Prothrombin Time: 13.5 seconds (ref 11.6–15.2)

## 2012-08-24 LAB — URINE MICROSCOPIC-ADD ON

## 2012-08-24 LAB — CREATININE, SERUM: GFR calc Af Amer: 41 mL/min — ABNORMAL LOW (ref >90)

## 2012-08-24 LAB — COMPREHENSIVE METABOLIC PANEL
Albumin: 3.5 g/dL (ref 3.5–5.2)
BUN: 27 mg/dL — ABNORMAL HIGH (ref 6–23)
Calcium: 9 mg/dL (ref 8.4–10.5)
Creatinine, Ser: 1.79 mg/dL — ABNORMAL HIGH (ref 0.50–1.35)
GFR calc Af Amer: 38 mL/min — ABNORMAL LOW (ref >90)
Glucose, Bld: 121 mg/dL — ABNORMAL HIGH (ref 70–99)
Total Protein: 7.1 g/dL (ref 6.0–8.3)

## 2012-08-24 LAB — CG4 I-STAT (LACTIC ACID): Lactic Acid, Venous: 1.32 mmol/L (ref 0.5–2.2)

## 2012-08-24 LAB — TROPONIN I: Troponin I: <0.3 ng/mL (ref <0.30)

## 2012-08-24 LAB — POCT I-STAT TROPONIN I

## 2012-08-24 LAB — LIPASE, BLOOD: Lipase: 25 U/L (ref 11–59)

## 2012-08-24 MED ORDER — PANTOPRAZOLE SODIUM 40 MG PO TBEC
40.0000 mg | DELAYED_RELEASE_TABLET | Freq: Every day | ORAL | Status: DC
  Administered 2012-08-25 – 2012-08-30 (×5): 40 mg via ORAL
  Filled 2012-08-24 (×5): qty 1

## 2012-08-24 MED ORDER — ONDANSETRON HCL 4 MG PO TABS: 4.0000 mg | ORAL_TABLET | Freq: Four times a day (QID) | ORAL | Status: DC | PRN

## 2012-08-24 MED ORDER — SODIUM CHLORIDE 0.9 % IV SOLN
INTRAVENOUS | Status: DC
  Administered 2012-08-24: 22:00:00 via INTRAVENOUS

## 2012-08-24 MED ORDER — ACETAMINOPHEN 325 MG PO TABS
650.0000 mg | ORAL_TABLET | Freq: Once | ORAL | Status: AC
  Administered 2012-08-24: 650 mg via ORAL
  Filled 2012-08-24: qty 2

## 2012-08-24 MED ORDER — SODIUM CHLORIDE 0.9 % IV SOLN: 250.0000 mL | INTRAVENOUS | Status: DC | PRN

## 2012-08-24 MED ORDER — DILTIAZEM HCL 25 MG/5ML IV SOLN
20.0000 mg | Freq: Once | INTRAVENOUS | Status: AC
  Administered 2012-08-24: 20 mg via INTRAVENOUS
  Filled 2012-08-24: qty 5

## 2012-08-24 MED ORDER — DILTIAZEM HCL 100 MG IV SOLR
5.0000 mg/h | Freq: Once | INTRAVENOUS | Status: AC
  Administered 2012-08-24: 10 mg/h via INTRAVENOUS

## 2012-08-24 MED ORDER — ENOXAPARIN SODIUM 40 MG/0.4ML ~~LOC~~ SOLN: 40.0000 mg | SUBCUTANEOUS | Status: DC

## 2012-08-24 MED ORDER — SODIUM CHLORIDE 0.9 % IV SOLN
250.0000 mg | Freq: Four times a day (QID) | INTRAVENOUS | Status: DC
  Administered 2012-08-24 – 2012-08-25 (×2): 250 mg via INTRAVENOUS
  Filled 2012-08-24 (×4): qty 250

## 2012-08-24 MED ORDER — ASPIRIN EC 81 MG PO TBEC
81.0000 mg | DELAYED_RELEASE_TABLET | Freq: Every day | ORAL | Status: DC
  Administered 2012-08-25 – 2012-08-30 (×6): 81 mg via ORAL
  Filled 2012-08-24 (×6): qty 1

## 2012-08-24 MED ORDER — ENOXAPARIN SODIUM 40 MG/0.4ML ~~LOC~~ SOLN
40.0000 mg | Freq: Every day | SUBCUTANEOUS | Status: DC
  Administered 2012-08-24: 40 mg via SUBCUTANEOUS
  Filled 2012-08-24 (×2): qty 0.4

## 2012-08-24 MED ORDER — ACETAMINOPHEN 650 MG RE SUPP: 650.0000 mg | Freq: Four times a day (QID) | RECTAL | Status: DC | PRN

## 2012-08-24 MED ORDER — MAGNESIUM SULFATE 40 MG/ML IJ SOLN
2.0000 g | Freq: Once | INTRAMUSCULAR | Status: AC
  Administered 2012-08-24: 2 g via INTRAVENOUS
  Filled 2012-08-24: qty 50

## 2012-08-24 MED ORDER — ONDANSETRON HCL 4 MG/2ML IJ SOLN: 4.0000 mg | Freq: Four times a day (QID) | INTRAMUSCULAR | Status: DC | PRN

## 2012-08-24 MED ORDER — SODIUM CHLORIDE 0.9 % IJ SOLN: 3.0000 mL | INTRAMUSCULAR | Status: DC | PRN

## 2012-08-24 MED ORDER — ACETAMINOPHEN 325 MG PO TABS
650.0000 mg | ORAL_TABLET | Freq: Four times a day (QID) | ORAL | Status: DC | PRN
  Administered 2012-08-25 – 2012-08-26 (×3): 650 mg via ORAL
  Filled 2012-08-24 (×3): qty 2

## 2012-08-24 MED ORDER — SODIUM CHLORIDE 0.9 % IJ SOLN
3.0000 mL | Freq: Two times a day (BID) | INTRAMUSCULAR | Status: DC
  Administered 2012-08-25 – 2012-08-28 (×5): 3 mL via INTRAVENOUS
  Administered 2012-08-28: 22:00:00 via INTRAVENOUS
  Administered 2012-08-29: 3 mL via INTRAVENOUS

## 2012-08-24 MED ORDER — DILTIAZEM HCL 100 MG IV SOLR
5.0000 mg/h | INTRAVENOUS | Status: DC
  Administered 2012-08-24: 15 mg/h via INTRAVENOUS
  Filled 2012-08-24: qty 100

## 2012-08-24 MED ORDER — SODIUM CHLORIDE 0.9 % IJ SOLN: 3.0000 mL | Freq: Two times a day (BID) | INTRAMUSCULAR | Status: DC

## 2012-08-24 MED ORDER — VANCOMYCIN HCL IN DEXTROSE 1-5 GM/200ML-% IV SOLN
1000.0000 mg | Freq: Every day | INTRAVENOUS | Status: DC
  Administered 2012-08-24: 1000 mg via INTRAVENOUS
  Filled 2012-08-24 (×2): qty 200
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
## 2012-08-24 NOTE — ED Notes (Signed)
Pt presents to ED via EMS from home with c/o slurred speech and unable to ambulate out of chair. When pt arrived, pt alert, oriented. Pt taken to ct from ems then to room A7.  CBG 138. 20g placed to left fa via ems.

## 2012-08-24 NOTE — Code Documentation (Signed)
79 year old male who presented to [facility] via [facility] as Code Stroke.  Family reports LSW was 1600 - the patient took nap - when he awakened he was unable to sit up and was confused.  Code stroke called at 1808 with ETA 10 mins - arrived to ED at 1830.  EDP exam at 1831.  Stroke team arrival at 1833.  To CT scan at 1831.  Patient was alert - mae x 4 - was anxious and seemed painful - screaming oh oh hurt. On arrival back to ED from CT scan we were able to calm the patient - he was focused - no neuro focal deficits noted - was difficult neuro exam secondary to pain - he now endorses neck and knee pain - has chronic knee pain per chart.  Dr. Lu Duffel present - no deficits noted with what exam patient would co-op with secondary to pain.  CT negative.  Code stroke cancelled.  Handoff to [employer].
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
## 2012-08-24 NOTE — ED Notes (Signed)
CBG 120 

## 2012-08-25 ENCOUNTER — Other Ambulatory Visit (HOSPITAL_COMMUNITY): Payer: Medicare Other

## 2012-08-25 ENCOUNTER — Inpatient Hospital Stay (HOSPITAL_COMMUNITY): Payer: Medicare Other

## 2012-08-25 LAB — BASIC METABOLIC PANEL
BUN: 29 mg/dL — ABNORMAL HIGH (ref 6–23)
Chloride: 102 mEq/L (ref 96–112)
Creatinine, Ser: 1.83 mg/dL — ABNORMAL HIGH (ref 0.50–1.35)
GFR calc Af Amer: 37 mL/min — ABNORMAL LOW (ref >90)
GFR calc non Af Amer: 32 mL/min — ABNORMAL LOW (ref >90)
Glucose, Bld: 112 mg/dL — ABNORMAL HIGH (ref 70–99)
Potassium: 3.3 mEq/L — ABNORMAL LOW (ref 3.5–5.1)
Sodium: 137 mEq/L (ref 135–145)

## 2012-08-25 LAB — HEPATIC FUNCTION PANEL
ALT: 78 U/L — ABNORMAL HIGH (ref 0–53)
AST: 64 U/L — ABNORMAL HIGH (ref 0–37)
Albumin: 2.8 g/dL — ABNORMAL LOW (ref 3.5–5.2)
Bilirubin, Direct: 2.9 mg/dL — ABNORMAL HIGH (ref 0.0–0.3)
Total Protein: 5.6 g/dL — ABNORMAL LOW (ref 6.0–8.3)

## 2012-08-25 LAB — TROPONIN I
Troponin I: <0.3 ng/mL
Troponin I: <0.3 ng/mL

## 2012-08-25 LAB — INFLUENZA PANEL BY PCR (TYPE A & B)
H1N1 flu by pcr: NOT DETECTED
Influenza A By PCR: NEGATIVE
Influenza B By PCR: NEGATIVE

## 2012-08-25 LAB — CBC
HCT: 32.8 % — ABNORMAL LOW (ref 39.0–52.0)
Hemoglobin: 11.8 g/dL — ABNORMAL LOW (ref 13.0–17.0)
MCHC: 36 g/dL (ref 30.0–36.0)
RBC: 3.35 MIL/uL — ABNORMAL LOW (ref 4.22–5.81)
RDW: 12.8 % (ref 11.5–15.5)

## 2012-08-25 LAB — GLUCOSE, CAPILLARY: Glucose-Capillary: 92 mg/dL (ref 70–99)

## 2012-08-25 LAB — TSH: TSH: 0.966 u[IU]/mL (ref 0.350–4.500)

## 2012-08-25 LAB — LACTIC ACID, PLASMA: Lactic Acid, Venous: 1 mmol/L (ref 0.5–2.2)

## 2012-08-25 LAB — MAGNESIUM: Magnesium: 2.5 mg/dL (ref 1.5–2.5)

## 2012-08-25 LAB — MRSA PCR SCREENING: MRSA by PCR: NEGATIVE

## 2012-08-25 MED ORDER — SODIUM CHLORIDE 0.9 % IV BOLUS (SEPSIS)
500.0000 mL | Freq: Once | INTRAVENOUS | Status: AC
  Administered 2012-08-25: 500 mL via INTRAVENOUS

## 2012-08-25 MED ORDER — METOPROLOL TARTRATE 25 MG PO TABS
25.0000 mg | ORAL_TABLET | Freq: Two times a day (BID) | ORAL | Status: DC
  Administered 2012-08-25: 25 mg via ORAL
  Filled 2012-08-25 (×3): qty 1

## 2012-08-25 MED ORDER — SODIUM CHLORIDE 0.9 % IV SOLN
INTRAVENOUS | Status: DC
  Administered 2012-08-25 – 2012-08-26 (×2): via INTRAVENOUS

## 2012-08-25 MED ORDER — SODIUM CHLORIDE 0.9 % IV SOLN
250.0000 mg | Freq: Four times a day (QID) | INTRAVENOUS | Status: DC
  Administered 2012-08-25 – 2012-08-30 (×20): 250 mg via INTRAVENOUS
  Filled 2012-08-25 (×22): qty 250

## 2012-08-25 MED ORDER — IOHEXOL 300 MG/ML  SOLN
25.0000 mL | INTRAMUSCULAR | Status: AC
  Administered 2012-08-25 (×2): 25 mL via ORAL

## 2012-08-25 MED ORDER — POTASSIUM CHLORIDE CRYS ER 20 MEQ PO TBCR
EXTENDED_RELEASE_TABLET | ORAL | Status: AC
  Administered 2012-08-25: 40 meq via ORAL
  Filled 2012-08-25: qty 2

## 2012-08-25 MED ORDER — POTASSIUM CHLORIDE CRYS ER 20 MEQ PO TBCR: 40.0000 meq | EXTENDED_RELEASE_TABLET | Freq: Once | ORAL | Status: AC

## 2012-08-25 NOTE — Progress Notes (Signed)
Pt's SBP started dropping to 80s and 70s and HR in 70s-80s. Cardizem drip turned off and MD notified. Order received to call MD if HR> 100. Will continue to monitor pt.

## 2012-08-25 NOTE — Progress Notes (Signed)
Subjective: The patient is an 79 YO man who was admitted with some complaint of abdominal pain. He is not having any complaints this morning and is very hard of hearing. He denies any chest pain or abdominal pain. He denies any SOB or coughing. He denies any leg swelling or pain. He denies any nausea or vomiting. He denies any diarrhea. He knows that he is in the hospital at [facility] however thinks it is April. He is not having any itching. He does not think he had any fevers or chills overnight. He is denying dizziness, lightheadedness, vertigo.   Objective: Vital signs in last 24 hours: Filed Vitals:   08/25/12 0645 08/25/12 0700 08/25/12 0715 08/25/12 0741  BP: 79/50 97/57 99/63    Pulse: 86 85 85 86  Temp:   98.3 F (36.8 C)   TempSrc:   Oral   Resp: 15 16 16 12   Height:      Weight:      SpO2: 97% 94% 94% 96%   Weight change:   Intake/Output Summary (Last 24 hours) at 08/25/12 1107 Last data filed at 08/25/12 1000  Gross per 24 hour  Intake   1741 ml  Output    200 ml  Net   1541 ml   Physical Exam: General: resting in bed, NAD, alert and oriented to person, dob, place, age, very hard of hearing HEENT: PERRL b/l, mild scleral icterus, no tenderness to moving head or neck, full ROM of neck, negative brudzinski's and kernig's signs Cardiac: irreg irreg, no murmurs  Pulm: clear to auscultation bilaterally, no wheezes, rales, or rhonchi  Abd: soft, nontender, nondistended, BS present (normal)  Ext: warm and well perfused, no pedal edema  Neuro: alert and oriented X2 (not to time), cranial nerves II-XII grossly intact, strength 5/5 b/l upper and lower extremities b/l  Lab Results: Basic Metabolic Panel:  Recent Labs Lab 08/24/12 1832 08/24/12 1849 08/24/12 2031 08/24/12 2308 08/25/12 0530  NA 136 140  --   --  137  K 3.5 3.6  --   --  3.3*  CL 100 104  --   --  102  CO2 24  --   --   --  25  GLUCOSE 121* 121*  --   --  112*  BUN 27* 26*  --   --  29*  CREATININE  1.79* 1.90*  --  1.69* 1.83*  CALCIUM 9.0  --   --   --  8.0*  MG  --   --  1.6  --  2.5   Liver Function Tests:  Recent Labs Lab 08/24/12 1832 08/25/12 0530  AST 109* 64*  ALT 109* 78*  ALKPHOS 161* 117  BILITOT 3.2* 4.0*  PROT 7.1 5.6*  ALBUMIN 3.5 2.8*    Recent Labs Lab 08/24/12 2308  LIPASE 25   CBC:  Recent Labs Lab 08/24/12 1832  08/24/12 2308 08/25/12 0530  WBC 12.0*  --  15.6* 11.9*  NEUTROABS 10.2*  --   --   --   HGB 14.4  < > 13.5 11.8*  HCT 39.2  < > 36.8* 32.8*  MCV 97.3  --  96.8 97.9  PLT 144*  --  138* 127*  < > = values in this interval not displayed.  Studies/Results: Dg Chest 2 View  08/25/2012   *RADIOLOGY REPORT*  Clinical Data: Fever.  CHEST - 2 VIEW  Comparison: 06/04/2006.  Findings: Borderline enlarged cardiac silhouette.  Clear lungs. Thoracic spine degenerative changes,  including changes of DISH. Old, healed left sixth rib fracture.  IMPRESSION: No acute abnormality.   Original Report Authenticated By: Beckie Salts, M.D.   Ct Head Wo Contrast  08/24/2012   *RADIOLOGY REPORT*  Clinical Data: Altered mental status and ataxia.  CT HEAD WITHOUT CONTRAST  Technique:  Contiguous axial images were obtained from the base of the skull through the vertex without contrast.  Comparison: 04/22/2005.  Findings: Mildly progressive diffuse enlargement of the ventricles and subarachnoid spaces.  Mildly progressive patchy white matter low density in both cerebral hemispheres.  No intracranial hemorrhage, mass lesion or CT evidence of acute infarction. Unremarkable bones and included paranasal sinuses.  Bilateral vertebral and internal carotid artery atheromatous calcifications.  IMPRESSION:  1.  No acute abnormality. 2.  Mildly progressive atrophy and chronic small vessel white matter ischemic changes.  These results were called by telephone on 08/24/2012 at 1839 hours to Dr. Roseanne Reno, who verbally acknowledged these results.   Original Report Authenticated By:  Beckie Salts, M.D.   US Abdomen Complete  08/25/2012   *RADIOLOGY REPORT*  Clinical Data:  Fever, jaundice, elevated white blood cell count  COMPLETE ABDOMINAL ULTRASOUND  Comparison:  None.  Findings:  Gallbladder:  Multiple small stones layering in the dependent aspect of the gallbladder.  Mild wall thickening up to 4.3 mm. Sonographer reports no sonographic Murphy's sign.  No pericholecystic fluid.  Common bile duct:  3.7 mm diameter, unremarkable.  Liver:  No focal lesion identified.  Within normal limits in parenchymal echogenicity. No definite intrahepatic biliary ductal dilatation.  IVC:  Appears normal.  Pancreas:  Negative, portions obscured by overlying bowel gas.  Spleen:  4.6 cm craniocaudal length, unremarkable.  Right Kidney:  9.3 cm. No hydronephrosis.  Well-preserved cortex. Normal size and parenchymal echotexture without focal abnormalities.  Left Kidney:  9.6 cm. No hydronephrosis.  Well-preserved cortex. Normal size and parenchymal echotexture without focal abnormalities.  Abdominal aorta:  No aneurysm identified.  IMPRESSION:  1.  Cholelithiasis and mild gallbladder wall thickening.   Original Report Authenticated By: D. Andria Rhein, MD   Medications: I have reviewed the patient's current medications. Scheduled Meds: . aspirin EC  81 mg Oral Daily  . imipenem-cilastatin  250 mg Intravenous Q6H  . iohexol  25 mL Oral Q1 Hr x 2  . pantoprazole  40 mg Oral Daily  . sodium chloride  3 mL Intravenous Q12H   Continuous Infusions: . sodium chloride 100 mL/hr at 08/25/12 0924   PRN Meds:.acetaminophen, acetaminophen, ondansetron (ZOFRAN) IV, ondansetron Assessment/Plan:  Atrial fibrillation with rapid ventricular response - Resolved, diltiazem drip off and having some hypotension. Will continue to monitor closely and will give fluids as needed. -Hold beta blocker  -Diltiazem drip off -Still unclear precipitant for the RVR  SIRS (systemic inflammatory response syndrome) - Unclear  source given no urinary complaints, no pneumonia. Likely source is abdominal given pain in RUQ on admission and elevated LFTs. His bilirubin trending up is concerning and will work up further with CT abdomen/pelvis without contrast. Concern is some kind of obstruction in the liver or ducts to cause painless jaundice.  -CT abdomen/pelvis -Fluids for hypotension prn -Stop Vancomycin as likely source is abdominal -Continue imipenem as pt is allergic to penicillin  Hyperbilirubinemia - Unclear source and alk phos is normal which is surprising. No clear mass or biliary dilation on US abdomen but will check CT abdomen pelvis to rule out masses or other abdominal source for infection. Will continue to monitor LFTs daily and  will check hepatitis panel as well.   GERD - Protonix daily and stable.   CKD (chronic kidney disease), stage III - Creatinine 1.8 today is around baseline and will monitor closely.   Dispo: Disposition is deferred at this time, awaiting improvement of current medical problems.  Anticipated discharge in approximately 2-3 day(s).   The patient does have a current PCP (HO,MICHELE, MD), therefore is requiring OPC follow-up after discharge.   The patient does not have transportation limitations that hinder transportation to clinic appointments.  .Services Needed at time of discharge: Y = Yes, Blank = No PT:   OT:   RN:   Equipment:   Other:     LOS: 1 day   Genella Mech 08/25/2012, 11:07 AM

## 2012-08-25 NOTE — H&P (Signed)
Date: 08/25/2012  Patient name: [name] Page  Medical record number: [id number]  Date of birth: Jun 06, 1924   I have seen and evaluated [name] and discussed their care with the Residency Team. Mr Osborn is hard of hearing so some of the HPI was obtained from Dr Alford Highland H&P as she was able to speak directly to his wife. Pt was in usual state of health until 1 PM on day of admissions when he c/o abd pain, LUQ or B upper quadrants. Wife gave him a tramadol and he laid down to rest. At 4 PM, he was unable to sit up from lying down and 911 was called. An hour later, the wife noticed that he had slurred speech. Other sxs inc generalized weakness but otherwise ROS is not possible. Pt denies any pain on my visit today. States neck pain, ABD pain, and HA are better. Was able to take some liquid this AM. Slept well.  Physical Exam: Blood pressure 99/63, pulse 86, temperature 98.3 F (36.8 C), temperature source Oral, resp. rate 12, height 6\' 3"  (1.905 m), weight 178 lb 9.2 oz (81 kg), SpO2 96.00%. General appearance: alert, cooperative, appears older than stated age, no distress and Hard of hearing, was figiting when I was outside of room. Head: Normocephalic, without obvious abnormality, atraumatic Eyes: EOMI Back: no skin lesions, erythema, or scars Lungs: clear to auscultation bilaterally Heart: irregularly irregular rhythm and no murmur Abdomen: soft, non-tender; bowel sounds normal; no masses,  no organomegaly and Good BS, minimal and transient tenderness to epigastric palp. Otherwise, able to palpate aggressively without grimace Extremities: extremities normal, atraumatic, no cyanosis or edema Pulses: 2+ and symmetric Skin: Skin color, texture, turgor normal. No rashes or lesions Neuro : able to move neck side to side and up and down without pain. Alert, when able to understand me, follows commands. Moves all 4, no focal deficits.  Lab results: Results for orders placed during the  hospital encounter of 08/24/12 (from the past 24 hour(s))  ETHANOL     Status: None   Collection Time    08/24/12  6:32 PM      Result Value Range   Alcohol, Ethyl (B) <11  0 - 11 mg/dL  PROTIME-INR     Status: None   Collection Time    08/24/12  6:32 PM      Result Value Range   Prothrombin Time 13.5  11.6 - 15.2 seconds   INR 1.04  0.00 - 1.49  APTT     Status: None   Collection Time    08/24/12  6:32 PM      Result Value Range   aPTT 32  24 - 37 seconds  CBC     Status: Abnormal   Collection Time    08/24/12  6:32 PM      Result Value Range   WBC 12.0 (*) 4.0 - 10.5 K/uL   RBC 4.03 (*) 4.22 - 5.81 MIL/uL   Hemoglobin 14.4  13.0 - 17.0 g/dL   HCT 40.9  81.1 - 91.4 %   MCV 97.3  78.0 - 100.0 fL   MCH 35.7 (*) 26.0 - 34.0 pg   MCHC 36.7 (*) 30.0 - 36.0 g/dL   RDW 78.2  95.6 - 21.3 %   Platelets 144 (*) 150 - 400 K/uL  DIFFERENTIAL     Status: Abnormal   Collection Time    08/24/12  6:32 PM      Result Value Range  Neutrophils Relative % 84 (*) 43 - 77 %   Neutro Abs 10.2 (*) 1.7 - 7.7 K/uL   Lymphocytes Relative 7 (*) 12 - 46 %   Lymphs Abs 0.8  0.7 - 4.0 K/uL   Monocytes Relative 9  3 - 12 %   Monocytes Absolute 1.0  0.1 - 1.0 K/uL   Eosinophils Relative 1  0 - 5 %   Eosinophils Absolute 0.1  0.0 - 0.7 K/uL   Basophils Relative 0  0 - 1 %   Basophils Absolute 0.0  0.0 - 0.1 K/uL  COMPREHENSIVE METABOLIC PANEL     Status: Abnormal   Collection Time    08/24/12  6:32 PM      Result Value Range   Sodium 136  135 - 145 mEq/L   Potassium 3.5  3.5 - 5.1 mEq/L   Chloride 100  96 - 112 mEq/L   CO2 24  19 - 32 mEq/L   Glucose, Bld 121 (*) 70 - 99 mg/dL   BUN 27 (*) 6 - 23 mg/dL   Creatinine, Ser 9.60 (*) 0.50 - 1.35 mg/dL   Calcium 9.0  8.4 - 45.4 mg/dL   Total Protein 7.1  6.0 - 8.3 g/dL   Albumin 3.5  3.5 - 5.2 g/dL   AST 098 (*) 0 - 37 U/L   ALT 109 (*) 0 - 53 U/L   Alkaline Phosphatase 161 (*) 39 - 117 U/L   Total Bilirubin 3.2 (*) 0.3 - 1.2 mg/dL   GFR calc  non Af Amer 33 (*) >90 mL/min   GFR calc Af Amer 38 (*) >90 mL/min  TROPONIN I     Status: None   Collection Time    08/24/12  6:33 PM      Result Value Range   Troponin I <0.30  <0.30 ng/mL  POCT I-STAT TROPONIN I     Status: None   Collection Time    08/24/12  6:47 PM      Result Value Range   Troponin i, poc 0.00  0.00 - 0.08 ng/mL   Comment 3           POCT I-STAT, CHEM 8     Status: Abnormal   Collection Time    08/24/12  6:49 PM      Result Value Range   Sodium 140  135 - 145 mEq/L   Potassium 3.6  3.5 - 5.1 mEq/L   Chloride 104  96 - 112 mEq/L   BUN 26 (*) 6 - 23 mg/dL   Creatinine, Ser 1.19 (*) 0.50 - 1.35 mg/dL   Glucose, Bld 147 (*) 70 - 99 mg/dL   Calcium, Ion 8.29 (*) 1.13 - 1.30 mmol/L   TCO2 24  0 - 100 mmol/L   Hemoglobin 15.0  13.0 - 17.0 g/dL   HCT 56.2  13.0 - 86.5 %  URINE RAPID DRUG SCREEN (HOSP PERFORMED)     Status: None   Collection Time    08/24/12  7:15 PM      Result Value Range   Opiates NONE DETECTED  NONE DETECTED   Cocaine NONE DETECTED  NONE DETECTED   Benzodiazepines NONE DETECTED  NONE DETECTED   Amphetamines NONE DETECTED  NONE DETECTED   Tetrahydrocannabinol NONE DETECTED  NONE DETECTED   Barbiturates NONE DETECTED  NONE DETECTED  URINALYSIS, ROUTINE W REFLEX MICROSCOPIC     Status: Abnormal   Collection Time    08/24/12  7:15 PM  Result Value Range   Color, Urine YELLOW  YELLOW   APPearance CLOUDY (*) CLEAR   Specific Gravity, Urine 1.015  1.005 - 1.030   pH 8.0  5.0 - 8.0   Glucose, UA NEGATIVE  NEGATIVE mg/dL   Hgb urine dipstick NEGATIVE  NEGATIVE   Bilirubin Urine NEGATIVE  NEGATIVE   Ketones, ur NEGATIVE  NEGATIVE mg/dL   Protein, ur NEGATIVE  NEGATIVE mg/dL   Urobilinogen, UA 2.0 (*) 0.0 - 1.0 mg/dL   Nitrite NEGATIVE  NEGATIVE   Leukocytes, UA TRACE (*) NEGATIVE  URINE MICROSCOPIC-ADD ON     Status: Abnormal   Collection Time    08/24/12  7:15 PM      Result Value Range   Squamous Epithelial / LPF RARE  RARE    WBC, UA 7-10  <3 WBC/hpf   Bacteria, UA FEW (*) RARE  GLUCOSE, CAPILLARY     Status: Abnormal   Collection Time    08/24/12  7:21 PM      Result Value Range   Glucose-Capillary 120 (*) 70 - 99 mg/dL   Comment 1 Documented in Chart     Comment 2 Notify RN    LACTIC ACID, PLASMA     Status: None   Collection Time    08/24/12  8:27 PM      Result Value Range   Lactic Acid, Venous 1.3  0.5 - 2.2 mmol/L  TSH     Status: None   Collection Time    08/24/12  8:31 PM      Result Value Range   TSH 0.966  0.350 - 4.500 uIU/mL  HEMOGLOBIN A1C     Status: None   Collection Time    08/24/12  8:31 PM      Result Value Range   Hemoglobin A1C 5.2  <5.7 %   Mean Plasma Glucose 103  <117 mg/dL  MAGNESIUM     Status: None   Collection Time    08/24/12  8:31 PM      Result Value Range   Magnesium 1.6  1.5 - 2.5 mg/dL  INFLUENZA PANEL BY PCR     Status: None   Collection Time    08/24/12  9:00 PM      Result Value Range   Influenza A By PCR NEGATIVE  NEGATIVE   Influenza B By PCR NEGATIVE  NEGATIVE   H1N1 flu by pcr NOT DETECTED  NOT DETECTED  CG4 I-STAT (LACTIC ACID)     Status: None   Collection Time    08/24/12  9:17 PM      Result Value Range   Lactic Acid, Venous 1.32  0.5 - 2.2 mmol/L  GLUCOSE, CAPILLARY     Status: Abnormal   Collection Time    08/24/12 10:32 PM      Result Value Range   Glucose-Capillary 142 (*) 70 - 99 mg/dL  MRSA PCR SCREENING     Status: None   Collection Time    08/24/12 10:35 PM      Result Value Range   MRSA by PCR NEGATIVE  NEGATIVE  LIPID PANEL     Status: None   Collection Time    08/24/12 11:00 PM      Result Value Range   Cholesterol 142  0 - 200 mg/dL   Triglycerides 49  <811 mg/dL   HDL 48  >91 mg/dL   Total CHOL/HDL Ratio 3.0     VLDL 10  0 - 40 mg/dL  LDL Cholesterol 84  0 - 99 mg/dL  TROPONIN I     Status: None   Collection Time    08/24/12 11:00 PM      Result Value Range   Troponin I <0.30  <0.30 ng/mL  LACTATE DEHYDROGENASE      Status: None   Collection Time    08/24/12 11:08 PM      Result Value Range   LDH 231  94 - 250 U/L  CBC     Status: Abnormal   Collection Time    08/24/12 11:08 PM      Result Value Range   WBC 15.6 (*) 4.0 - 10.5 K/uL   RBC 3.80 (*) 4.22 - 5.81 MIL/uL   Hemoglobin 13.5  13.0 - 17.0 g/dL   HCT 56.2 (*) 13.0 - 86.5 %   MCV 96.8  78.0 - 100.0 fL   MCH 35.5 (*) 26.0 - 34.0 pg   MCHC 36.7 (*) 30.0 - 36.0 g/dL   RDW 78.4  69.6 - 29.5 %   Platelets 138 (*) 150 - 400 K/uL  CREATININE, SERUM     Status: Abnormal   Collection Time    08/24/12 11:08 PM      Result Value Range   Creatinine, Ser 1.69 (*) 0.50 - 1.35 mg/dL   GFR calc non Af Amer 35 (*) >90 mL/min   GFR calc Af Amer 41 (*) >90 mL/min  LIPASE, BLOOD     Status: None   Collection Time    08/24/12 11:08 PM      Result Value Range   Lipase 25  11 - 59 U/L  TROPONIN I     Status: None   Collection Time    08/25/12  5:30 AM      Result Value Range   Troponin I <0.30  <0.30 ng/mL  BASIC METABOLIC PANEL     Status: Abnormal   Collection Time    08/25/12  5:30 AM      Result Value Range   Sodium 137  135 - 145 mEq/L   Potassium 3.3 (*) 3.5 - 5.1 mEq/L   Chloride 102  96 - 112 mEq/L   CO2 25  19 - 32 mEq/L   Glucose, Bld 112 (*) 70 - 99 mg/dL   BUN 29 (*) 6 - 23 mg/dL   Creatinine, Ser 2.84 (*) 0.50 - 1.35 mg/dL   Calcium 8.0 (*) 8.4 - 10.5 mg/dL   GFR calc non Af Amer 32 (*) >90 mL/min   GFR calc Af Amer 37 (*) >90 mL/min  CBC     Status: Abnormal   Collection Time    08/25/12  5:30 AM      Result Value Range   WBC 11.9 (*) 4.0 - 10.5 K/uL   RBC 3.35 (*) 4.22 - 5.81 MIL/uL   Hemoglobin 11.8 (*) 13.0 - 17.0 g/dL   HCT 13.2 (*) 44.0 - 10.2 %   MCV 97.9  78.0 - 100.0 fL   MCH 35.2 (*) 26.0 - 34.0 pg   MCHC 36.0  30.0 - 36.0 g/dL   RDW 72.5  36.6 - 44.0 %   Platelets 127 (*) 150 - 400 K/uL  HEPATIC FUNCTION PANEL     Status: Abnormal   Collection Time    08/25/12  5:30 AM      Result Value Range   Total  Protein 5.6 (*) 6.0 - 8.3 g/dL   Albumin 2.8 (*) 3.5 - 5.2 g/dL  AST 64 (*) 0 - 37 U/L   ALT 78 (*) 0 - 53 U/L   Alkaline Phosphatase 117  39 - 117 U/L   Total Bilirubin 4.0 (*) 0.3 - 1.2 mg/dL   Bilirubin, Direct 2.9 (*) 0.0 - 0.3 mg/dL   Indirect Bilirubin 1.1 (*) 0.3 - 0.9 mg/dL  MAGNESIUM     Status: None   Collection Time    08/25/12  5:30 AM      Result Value Range   Magnesium 2.5  1.5 - 2.5 mg/dL  LACTIC ACID, PLASMA     Status: None   Collection Time    08/25/12  7:35 AM      Result Value Range   Lactic Acid, Venous 1.0  0.5 - 2.2 mmol/L  GLUCOSE, CAPILLARY     Status: None   Collection Time    08/25/12  7:44 AM      Result Value Range   Glucose-Capillary 92  70 - 99 mg/dL    Imaging results:  Dg Chest 2 View  08/25/2012   *RADIOLOGY REPORT*  Clinical Data: Fever.  CHEST - 2 VIEW  Comparison: 06/04/2006.  Findings: Borderline enlarged cardiac silhouette.  Clear lungs. Thoracic spine degenerative changes, including changes of DISH. Old, healed left sixth rib fracture.  IMPRESSION: No acute abnormality.   Original Report Authenticated By: Beckie Salts, M.D.   Ct Head Wo Contrast  08/24/2012   *RADIOLOGY REPORT*  Clinical Data: Altered mental status and ataxia.  CT HEAD WITHOUT CONTRAST  Technique:  Contiguous axial images were obtained from the base of the skull through the vertex without contrast.  Comparison: 04/22/2005.  Findings: Mildly progressive diffuse enlargement of the ventricles and subarachnoid spaces.  Mildly progressive patchy white matter low density in both cerebral hemispheres.  No intracranial hemorrhage, mass lesion or CT evidence of acute infarction. Unremarkable bones and included paranasal sinuses.  Bilateral vertebral and internal carotid artery atheromatous calcifications.  IMPRESSION:  1.  No acute abnormality. 2.  Mildly progressive atrophy and chronic small vessel white matter ischemic changes.  These results were called by telephone on 08/24/2012 at  1839 hours to Dr. Roseanne Reno, who verbally acknowledged these results.   Original Report Authenticated By: Beckie Salts, M.D.   US Abdomen Complete  08/25/2012   *RADIOLOGY REPORT*  Clinical Data:  Fever, jaundice, elevated white blood cell count  COMPLETE ABDOMINAL ULTRASOUND  Comparison:  None.  Findings:  Gallbladder:  Multiple small stones layering in the dependent aspect of the gallbladder.  Mild wall thickening up to 4.3 mm. Sonographer reports no sonographic Murphy's sign.  No pericholecystic fluid.  Common bile duct:  3.7 mm diameter, unremarkable.  Liver:  No focal lesion identified.  Within normal limits in parenchymal echogenicity. No definite intrahepatic biliary ductal dilatation.  IVC:  Appears normal.  Pancreas:  Negative, portions obscured by overlying bowel gas.  Spleen:  4.6 cm craniocaudal length, unremarkable.  Right Kidney:  9.3 cm. No hydronephrosis.  Well-preserved cortex. Normal size and parenchymal echotexture without focal abnormalities.  Left Kidney:  9.6 cm. No hydronephrosis.  Well-preserved cortex. Normal size and parenchymal echotexture without focal abnormalities.  Abdominal aorta:  No aneurysm identified.  IMPRESSION:  1.  Cholelithiasis and mild gallbladder wall thickening.   Original Report Authenticated By: D. Andria Rhein, MD    Assessment and Plan: I have seen and evaluated the patient as outlined above. I agree with the formulated Assessment and Plan as detailed in the residents' admission note, with the following changes:  1. SIRS, presumed sepsis - the source is not clear. Blood and urine cxs are pending. CXR and exam do not suggest a pul source. There is no skin rash or abscess visible. He was able to move neck so meningitis is unlikely. Flu is negative. His LFT's and rising bili (direct) point towards a GI source. U/S not revealing so will pursue a CT without contrast 2/2 CRI. Possible GI sources - Gallbladder infxn not seen on U/S, abscess, acute mesenteric ischemia  (but no appreciable pain). Standard sepsis tx - IVF & ABX. He needs step down care today but will reassess in AM.  2. Persistent A Fib - Documented in 07/2010 & 3/13 that pt and wife did not want ASA nor warfarin 2/2 prior GI bleed. He remains rate controlled off Cardizem.   3. Weakness - CT head was negative for acute changes. His weakness is likely 2/2 his sepsis rather than a CVA. Therefore, no carotids nor ECHO are needed at this time. Will get PT / OT consult.  Burns Spain, MD 6/15/201411:17 AM

## 2012-08-25 NOTE — Progress Notes (Signed)
Lollie Sails, MD made aware of pt HR sustaining in the 100s-110s, with jumps up to the 120s-130s with movement (urination, movement in bed, etc).  Pt has also been urinating frequently, small amounts of amber colored urine.  Orders received to complete orthostatic vitals.  See doc flowsheets for results.  Will continue to monitor.  Salomon Mast, RN

## 2012-08-25 NOTE — Progress Notes (Signed)
Called for increasing HRs.  Pt is not orthostatic.  In atrial fibrillation.  Pt did not tolerate diltiazem gtt very well earlier this morning.  We will try metoprolol tartrate PO at a low dose of 25 mg for now.

## 2012-08-26 DIAGNOSIS — R6510 Systemic inflammatory response syndrome (SIRS) of non-infectious origin without acute organ dysfunction: Secondary | ICD-10-CM

## 2012-08-26 LAB — CBC
HCT: 35.9 % — ABNORMAL LOW (ref 39.0–52.0)
MCV: 97 fL (ref 78.0–100.0)
Platelets: 115 10*3/uL — ABNORMAL LOW (ref 150–400)
RBC: 3.7 MIL/uL — ABNORMAL LOW (ref 4.22–5.81)
RDW: 13.2 % (ref 11.5–15.5)
WBC: 6.9 10*3/uL (ref 4.0–10.5)

## 2012-08-26 LAB — COMPREHENSIVE METABOLIC PANEL
ALT: 65 U/L — ABNORMAL HIGH (ref 0–53)
Albumin: 3.1 g/dL — ABNORMAL LOW (ref 3.5–5.2)
Alkaline Phosphatase: 117 U/L (ref 39–117)
BUN: 24 mg/dL — ABNORMAL HIGH (ref 6–23)
Creatinine, Ser: 1.63 mg/dL — ABNORMAL HIGH (ref 0.50–1.35)
GFR calc Af Amer: 42 mL/min — ABNORMAL LOW (ref >90)
Glucose, Bld: 96 mg/dL (ref 70–99)
Potassium: 3.8 mEq/L (ref 3.5–5.1)
Sodium: 136 mEq/L (ref 135–145)
Total Bilirubin: 5.2 mg/dL — ABNORMAL HIGH (ref 0.3–1.2)
Total Protein: 6.4 g/dL (ref 6.0–8.3)

## 2012-08-26 LAB — HEPATITIS PANEL, ACUTE
HCV Ab: NEGATIVE
Hep A IgM: NEGATIVE
Hep B C IgM: NEGATIVE
Hepatitis B Surface Ag: NEGATIVE

## 2012-08-26 LAB — GLUCOSE, CAPILLARY

## 2012-08-26 MED ORDER — VANCOMYCIN HCL IN DEXTROSE 1-5 GM/200ML-% IV SOLN
1000.0000 mg | INTRAVENOUS | Status: DC
  Administered 2012-08-26 – 2012-08-28 (×3): 1000 mg via INTRAVENOUS
  Filled 2012-08-26 (×3): qty 200

## 2012-08-26 MED ORDER — TRAMADOL HCL 50 MG PO TABS
50.0000 mg | ORAL_TABLET | Freq: Once | ORAL | Status: AC
  Administered 2012-08-26: 50 mg via ORAL
  Filled 2012-08-26: qty 1

## 2012-08-26 MED ORDER — METOPROLOL TARTRATE 50 MG PO TABS
50.0000 mg | ORAL_TABLET | Freq: Two times a day (BID) | ORAL | Status: DC
  Administered 2012-08-26 (×2): 50 mg via ORAL
  Filled 2012-08-26 (×5): qty 1

## 2012-08-26 NOTE — Progress Notes (Signed)
Report given to [place], 2000 RN. Pt to transfer to 2034 via wheelchair. VS stable at time of transfer. Belongings at bedside. No current questions or complaints. [name] Page
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
## 2012-08-26 NOTE — Progress Notes (Signed)
[facility] notified of urine culture results. HR with activity increases into the 150s. MDs aware. Will cont to monitor pt. Billye Pickerel L

## 2012-08-26 NOTE — Progress Notes (Addendum)
Subjective: He complains of bilateral breast tenderness, bilateral thigh tenderness, and neck tenderness. He denies chest pain, abdominal pain, headache, dysuria, or neck stiffness. He has urinary frequency with 50-13ml of urine every 45 minutes. His appetite is good. He feels better overall and requests to go home.   Objective: Vital signs in last 24 hours: Filed Vitals:   08/26/12 0400 08/26/12 0500 08/26/12 0548 08/26/12 0726  BP: 128/79   120/70  Pulse: 116  107 116  Temp:    97.1 F (36.2 C)  TempSrc:    Oral  Resp: 20  17 21   Height:      Weight:  178 lb 9.2 oz (81 kg)    SpO2: 92%  96% 94%   Weight change: 14.1 oz (0.4 kg)  Intake/Output Summary (Last 24 hours) at 08/26/12 0759 Last data filed at 08/26/12 0729  Gross per 24 hour  Intake   4050 ml  Output   1800 ml  Net   2250 ml   Vitals reviewed. General: Very hard of hearing, reads lips, sitting up in bed eating his breakfast, in NAD HEENT: no scleral icterus (surprisingly)  Cardiac: tachycardia, irregularly irregular rate, no murmurs, rubs, or gallops Pulm: clear to auscultation bilaterally, no wheezes, rales, or rhonchi Abd: soft, nontender, nondistended, BS present Ext: warm and well perfused, no pedal edema Neuro: alert and oriented X3, moves 4 extremities voluntarily  Lab Results: Basic Metabolic Panel:  Recent Labs Lab 08/24/12 2031  08/25/12 0530 08/26/12 0440  NA  --   --  137 136  K  --   --  3.3* 3.8  CL  --   --  102 105  CO2  --   --  25 20  GLUCOSE  --   --  112* 96  BUN  --   --  29* 24*  CREATININE  --   < > 1.83* 1.63*  CALCIUM  --   --  8.0* 8.3*  MG 1.6  --  2.5  --   < > = values in this interval not displayed. Liver Function Tests:  Recent Labs Lab 08/25/12 0530 08/26/12 0440  AST 64* 55*  ALT 78* 65*  ALKPHOS 117 117  BILITOT 4.0* 5.2*  PROT 5.6* 6.4  ALBUMIN 2.8* 3.1*    Recent Labs Lab 08/24/12 2308  LIPASE 25   CBC:  Recent Labs Lab 08/24/12 1832   08/25/12 0530 08/26/12 0440  WBC 12.0*  < > 11.9* 6.9  NEUTROABS 10.2*  --   --   --   HGB 14.4  < > 11.8* 13.0  HCT 39.2  < > 32.8* 35.9*  MCV 97.3  < > 97.9 97.0  PLT 144*  < > 127* 115*  < > = values in this interval not displayed. Cardiac Enzymes:  Recent Labs Lab 08/24/12 1833 08/24/12 2300 08/25/12 0530  TROPONINI <0.30 <0.30 <0.30   CBG:  Recent Labs Lab 08/24/12 2232 08/25/12 0744 08/25/12 1156 08/25/12 1721 08/25/12 2100 08/26/12 0728  GLUCAP 142* 92 116* 104* 102* 90   Hemoglobin A1C:  Recent Labs Lab 08/24/12 2031  HGBA1C 5.2   Fasting Lipid Panel:  Recent Labs Lab 08/24/12 2300  CHOL 142  HDL 48  LDLCALC 84  TRIG 49  CHOLHDL 3.0   Thyroid Function Tests:  Recent Labs Lab 08/24/12 2031  TSH 0.966   Coagulation:  Recent Labs Lab 08/24/12 1832  LABPROT 13.5  INR 1.04   Urine Drug Screen: Drugs of  Abuse     Component Value Date/Time   LABOPIA NONE DETECTED 08/24/2012 1915   COCAINSCRNUR NONE DETECTED 08/24/2012 1915   LABBENZ NONE DETECTED 08/24/2012 1915   AMPHETMU NONE DETECTED 08/24/2012 1915   THCU NONE DETECTED 08/24/2012 1915   LABBARB NONE DETECTED 08/24/2012 1915    Alcohol Level:  Recent Labs Lab 08/24/12 1832  ETH <11   Urinalysis:  Recent Labs Lab 08/24/12 1915  COLORURINE YELLOW  LABSPEC 1.015  PHURINE 8.0  GLUCOSEU NEGATIVE  HGBUR NEGATIVE  BILIRUBINUR NEGATIVE  KETONESUR NEGATIVE  PROTEINUR NEGATIVE  UROBILINOGEN 2.0*  NITRITE NEGATIVE  LEUKOCYTESUR TRACE*    Micro Results: Recent Results (from the past 240 hour(s))  MRSA PCR SCREENING     Status: None   Collection Time    08/24/12 10:35 PM      Result Value Range Status   MRSA by PCR NEGATIVE  NEGATIVE Final   Comment:            The GeneXpert MRSA Assay (FDA     approved for NASAL specimens     only), is one component of a     comprehensive MRSA colonization     surveillance program. It is not     intended to diagnose MRSA     infection  nor to guide or     monitor treatment for     MRSA infections.   Studies/Results: Ct Abdomen Pelvis Wo Contrast  08/25/2012   *RADIOLOGY REPORT*  Clinical Data: Painless jaundice  CT ABDOMEN AND PELVIS WITHOUT CONTRAST  Technique:  Multidetector CT imaging of the abdomen and pelvis was performed following the standard protocol without intravenous contrast.  Comparison: Ultrasound abdomen 08/25/2012.  CT chest 06/04/2006.  Findings: The lung bases:  There is some respiratory motion. Linear scarring or atelectasis is seen adjacent to the major fissure on the right.  There is dependent atelectasis in both lower lobes and focal atelectasis or scarring laterally in the left lower lobe.  Coronary artery atherosclerotic calcifications are present. Aortic valve annular calcifications are noted.  Heart size appears within normal limits. Trace right pleural effusion.  Innumerable tiny gallstones are seen filling approximately half the volume of the gallbladder.  These were seen on today's ultrasound. Gallbladder wall thickening is visible by CT along the anterior aspect of the gallbladder, and most accurately measured on ultrasound (see today's abdominal ultrasound). It is noted that this gallbladder wall thickening appears unchanged compared to a chest CT of March 2008.  No pericholecystic fluid collection or definite stranding is identified.  The liver is normal in size.  No intrahepatic biliary ductal dilatation is appreciated.  On image number 37 there is rounded high density measuring 6 mm within the expected location of the common bile duct, proximal to the pancreatic head.  Please note that the common bile duct course is somewhat difficult to follow, as it is not dilated.  This density is seen on coronal image number 34.  On image #45 of the axial images is a dense calcification measuring 4 mm that is in the expected location of the distal common bile duct, near the ampulla, at junction with the second portion of  the duodenum.  See coronal image number 37.  The pancreas demonstrates slightly slight mild fatty atrophy.  No pancreatic mass or ductal dilatation is seen.  There are no pancreatic calcifications or fluid collections.  No peripancreatic inflammatory change.  The spleen and adrenal glands are within normal limits.  Both kidneys demonstrate  some cortical thinning and nonspecific perinephric stranding.  There is no hydronephrosis or evidence of mass on noncontrast CT.  Negative for renal stones.  There are some vascular calcifications in the branches of the renal arteries. Both ureters are normal in caliber.  No ureteral stone identified. Urinary bladder normal.  Prostate gland mildly prominent.  Small bowel loops are normal in caliber.  Terminal ileum normal. Appendix not definitely visualized.  No pericecal inflammatory changes.  There are diverticular changes of the descending colon and sigmoid colon, without evidence of inflammation.  There is atherosclerotic calcification of the abdominal aorta and iliac vasculature, without aneurysm.  There is atherosclerotic calcification scattered in the superior mesenteric artery and the proximal renal arteries.  Negative for ascites or lymphadenopathy.  There are multilevel degenerative changes of the spine.  No acute or suspicious bony abnormality.  IMPRESSION:  1.  Findings suspicious for choledocholithiasis.  Two separate rounded high density structures are seen within the expected location of the common bile duct, one just proximal to the pancreatic head, and one near the ampulla.  These densities measure 6 and 4 mm, respectively.  No defininite associated biliary ductal dilatation is identified. 2.  Cholelithiasis.  Mild gallbladder wall thickening is present, as described on today's abdominal ultrasound.  The gallbladder wall thickening may be chronic, as it appears unchanged dating back to chest CT of 2008.  Acute cholecystitis cannot be excluded by imaging, but is  certainly not definite.  There are no surrounding inflammatory changes.  Suggest clinical correlation. 3.  Coronary artery and abdominal and pelvic atherosclerosis. 4.  Colonic diverticulosis without acute inflammatory changes. 5.  Mild prostamegaly. 6.  Pancreas appears within normal limits. 7.  Trace right pleural effusion   Original Report Authenticated By: Britta Mccreedy, M.D.   Dg Chest 2 View  08/25/2012   *RADIOLOGY REPORT*  Clinical Data: Fever.  CHEST - 2 VIEW  Comparison: 06/04/2006.  Findings: Borderline enlarged cardiac silhouette.  Clear lungs. Thoracic spine degenerative changes, including changes of DISH. Old, healed left sixth rib fracture.  IMPRESSION: No acute abnormality.   Original Report Authenticated By: Beckie Salts, M.D.   Ct Head Wo Contrast  08/24/2012   *RADIOLOGY REPORT*  Clinical Data: Altered mental status and ataxia.  CT HEAD WITHOUT CONTRAST  Technique:  Contiguous axial images were obtained from the base of the skull through the vertex without contrast.  Comparison: 04/22/2005.  Findings: Mildly progressive diffuse enlargement of the ventricles and subarachnoid spaces.  Mildly progressive patchy white matter low density in both cerebral hemispheres.  No intracranial hemorrhage, mass lesion or CT evidence of acute infarction. Unremarkable bones and included paranasal sinuses.  Bilateral vertebral and internal carotid artery atheromatous calcifications.  IMPRESSION:  1.  No acute abnormality. 2.  Mildly progressive atrophy and chronic small vessel white matter ischemic changes.  These results were called by telephone on 08/24/2012 at 1839 hours to Dr. Roseanne Reno, who verbally acknowledged these results.   Original Report Authenticated By: Beckie Salts, M.D.   US Abdomen Complete  08/25/2012   *RADIOLOGY REPORT*  Clinical Data:  Fever, jaundice, elevated white blood cell count  COMPLETE ABDOMINAL ULTRASOUND  Comparison:  None.  Findings:  Gallbladder:  Multiple small stones layering  in the dependent aspect of the gallbladder.  Mild wall thickening up to 4.3 mm. Sonographer reports no sonographic Murphy's sign.  No pericholecystic fluid.  Common bile duct:  3.7 mm diameter, unremarkable.  Liver:  No focal lesion identified.  Within normal limits in  parenchymal echogenicity. No definite intrahepatic biliary ductal dilatation.  IVC:  Appears normal.  Pancreas:  Negative, portions obscured by overlying bowel gas.  Spleen:  4.6 cm craniocaudal length, unremarkable.  Right Kidney:  9.3 cm. No hydronephrosis.  Well-preserved cortex. Normal size and parenchymal echotexture without focal abnormalities.  Left Kidney:  9.6 cm. No hydronephrosis.  Well-preserved cortex. Normal size and parenchymal echotexture without focal abnormalities.  Abdominal aorta:  No aneurysm identified.  IMPRESSION:  1.  Cholelithiasis and mild gallbladder wall thickening.   Original Report Authenticated By: D. Andria Rhein, MD   Medications: I have reviewed the patient's current medications. Scheduled Meds: . aspirin EC  81 mg Oral Daily  . imipenem-cilastatin  250 mg Intravenous Q6H  . metoprolol tartrate  50 mg Oral BID  . pantoprazole  40 mg Oral Daily  . sodium chloride  3 mL Intravenous Q12H   Continuous Infusions:  PRN Meds:.acetaminophen, acetaminophen, ondansetron (ZOFRAN) IV, ondansetron Assessment/Plan:  Atrial fibrillation with rapid ventricular response - Persistent. He did not tolerated diltiazem drip well with hypotension and is now off this medication. He was started on metoprolol 25mg  BID yesterday but his heart rate is consistently above 100s and he is in A. Fib this morning. His blood pressure has been stable overnight and it is in the 120s/70s this morning.  -Transfer to telemetry -2D echo ordered -Increased metoprolol to 50mg  BID  -Still unclear precipitant for the RVR   SIRS (systemic inflammatory response syndrome) - Resolved (afebrile, with WBC wnl, but persistent A.fib with RVR). Urine  culture positive for Staph aureus, ssusceptability pending. Blood cultures with NGTD. He had pain in RUQ on admission and elevated LFTs. His bilirubin trending up is concerning, with CT abdomen/pelvis without contrast with findings suspicious for choledocholithiasis. -Fluids for hypotension prn  -Continue imipenem as pt is allergic to penicillin  -Added vancomycin back for UTI that could be due to MRSA  Hyperbilirubinemia - Unclear source and alk phos is normal which is surprising. No clear mass or biliary dilation on US abdomen or CT abdomen pelvis but two possibly obstructing gallstones. Will continue to monitor LFTs daily. Hepatitis panel negative.  -Follow up on GI consult recommendations  GERD - Protonix daily and stable.   CKD (chronic kidney disease), stage III - Creatinine 1.9 on admission, trending down, to 1.6 today. Will monitor closely.   Generalized weakness - He complained of generalized weakness on admission. UTI could be a contributing factor as well as A.fib with RVR.  -PT/OT eval and treat   Dispo: Disposition is deferred at this time, awaiting improvement of current medical problems.  Anticipated discharge in approximately 2-3 day(s).   The patient does have a current PCP Mathis Dad, MD) and does need an [facility] hospital follow-up appointment after discharge.  The patient does not have transportation limitations that hinder transportation to clinic appointments.  .Services Needed at time of discharge: Y = Yes, Blank = No PT:   OT:   RN:   Equipment:   Other:     LOS: 2 days   Ky Barban, MD 08/26/2012, 7:59 AM

## 2012-08-27 ENCOUNTER — Inpatient Hospital Stay (HOSPITAL_COMMUNITY): Payer: Medicare Other

## 2012-08-27 DIAGNOSIS — I4891 Unspecified atrial fibrillation: Secondary | ICD-10-CM

## 2012-08-27 LAB — GAMMA GT: GGT: 213 U/L — ABNORMAL HIGH (ref 7–51)

## 2012-08-27 LAB — CBC
HCT: 36.1 % — ABNORMAL LOW (ref 39.0–52.0)
MCV: 97.6 fL (ref 78.0–100.0)
RBC: 3.7 MIL/uL — ABNORMAL LOW (ref 4.22–5.81)
RDW: 13.3 % (ref 11.5–15.5)
WBC: 9.4 10*3/uL (ref 4.0–10.5)

## 2012-08-27 LAB — HEPATIC FUNCTION PANEL
AST: 63 U/L — ABNORMAL HIGH (ref 0–37)
Albumin: 3 g/dL — ABNORMAL LOW (ref 3.5–5.2)
Alkaline Phosphatase: 123 U/L — ABNORMAL HIGH (ref 39–117)
Total Protein: 6.4 g/dL (ref 6.0–8.3)

## 2012-08-27 LAB — BASIC METABOLIC PANEL
CO2: 23 mEq/L (ref 19–32)
Chloride: 102 mEq/L (ref 96–112)
Creatinine, Ser: 1.46 mg/dL — ABNORMAL HIGH (ref 0.50–1.35)

## 2012-08-27 LAB — URINE CULTURE

## 2012-08-27 MED ORDER — METOPROLOL TARTRATE 50 MG PO TABS
75.0000 mg | ORAL_TABLET | Freq: Two times a day (BID) | ORAL | Status: DC
  Administered 2012-08-27 – 2012-08-28 (×2): 75 mg via ORAL
  Filled 2012-08-27 (×3): qty 1

## 2012-08-27 MED ORDER — GADOBENATE DIMEGLUMINE 529 MG/ML IV SOLN: 10.0000 mL | Freq: Once | INTRAVENOUS | Status: AC | PRN

## 2012-08-27 MED ORDER — CARBAMIDE PEROXIDE 6.5 % OT SOLN
5.0000 [drp] | Freq: Two times a day (BID) | OTIC | Status: DC
  Administered 2012-08-27 – 2012-08-30 (×7): 5 [drp] via OTIC
  Filled 2012-08-27: qty 15

## 2012-08-27 NOTE — Progress Notes (Addendum)
Subjective: He nodded that he had no pain today and he feels "good". His appetite is good and he has been eating 100% of his meals. He reports that his hearing is not good due to earwax build up.     Objective: Vital signs in last 24 hours: Filed Vitals:   08/26/12 2103 08/27/12 0312 08/27/12 0444 08/27/12 0834  BP: 125/85  119/79   Pulse: 114  122 150  Temp:   97.7 F (36.5 C)   TempSrc:   Oral   Resp:   18   Height:      Weight:  169 lb 5 oz (76.8 kg)    SpO2:   97%    Weight change: -9 lb 4.2 oz (-4.2 kg)  Intake/Output Summary (Last 24 hours) at 08/27/12 1040 Last data filed at 08/27/12 0730  Gross per 24 hour  Intake    560 ml  Output   1625 ml  Net  -1065 ml   Vitals reviewed. General: Very hard of hearing, reads lips, sitting up in bed eating his breakfast, in NAD HEENT: Jaundice, scleral icterus (very different from yesterday), left conjunctiva mildly injected. Bilateral ear canals with no trauma, cerumen impaction, TMs not visualized.  Cardiac: tachycardia,  irregularly irregular rate, no murmurs, rubs, or gallops Pulm: clear to auscultation bilaterally, no wheezes, rales, or rhonchi Abd: soft, nontender, nondistended, BS present Ext: warm and well perfused, no pedal edema Neuro: alert and oriented X3, moves 4 extremities voluntarily  Lab Results: Basic Metabolic Panel:  Recent Labs Lab 08/24/12 2031  08/25/12 0530 08/26/12 0440 08/27/12 0515  NA  --   --  137 136 134*  K  --   --  3.3* 3.8 3.7  CL  --   --  102 105 102  CO2  --   --  25 20 23   GLUCOSE  --   --  112* 96 110*  BUN  --   --  29* 24* 19  CREATININE  --   < > 1.83* 1.63* 1.46*  CALCIUM  --   --  8.0* 8.3* 8.7  MG 1.6  --  2.5  --   --   < > = values in this interval not displayed. Liver Function Tests:  Recent Labs Lab 08/26/12 0440 08/27/12 0515  AST 55* 63*  ALT 65* 65*  ALKPHOS 117 123*  BILITOT 5.2* 6.3*  PROT 6.4 6.4  ALBUMIN 3.1* 3.0*    Recent Labs Lab  08/24/12 2308  LIPASE 25   CBC:  Recent Labs Lab 08/24/12 1832  08/26/12 0440 08/27/12 0515  WBC 12.0*  < > 6.9 9.4  NEUTROABS 10.2*  --   --   --   HGB 14.4  < > 13.0 13.1  HCT 39.2  < > 35.9* 36.1*  MCV 97.3  < > 97.0 97.6  PLT 144*  < > 115* 124*  < > = values in this interval not displayed. Cardiac Enzymes:  Recent Labs Lab 08/24/12 1833 08/24/12 2300 08/25/12 0530  TROPONINI <0.30 <0.30 <0.30   CBG:  Recent Labs Lab 08/24/12 2232 08/25/12 0744 08/25/12 1156 08/25/12 1721 08/25/12 2100 08/26/12 0728  GLUCAP 142* 92 116* 104* 102* 90   Hemoglobin A1C:  Recent Labs Lab 08/24/12 2031  HGBA1C 5.2   Fasting Lipid Panel:  Recent Labs Lab 08/24/12 2300  CHOL 142  HDL 48  LDLCALC 84  TRIG 49  CHOLHDL 3.0   Thyroid Function Tests:  Recent Labs Lab  08/24/12 2031 08/26/12 1454  TSH 0.966  --   FREET4  --  1.21   Coagulation:  Recent Labs Lab 08/24/12 1832  LABPROT 13.5  INR 1.04   Urine Drug Screen: Drugs of Abuse     Component Value Date/Time   LABOPIA NONE DETECTED 08/24/2012 1915   COCAINSCRNUR NONE DETECTED 08/24/2012 1915   LABBENZ NONE DETECTED 08/24/2012 1915   AMPHETMU NONE DETECTED 08/24/2012 1915   THCU NONE DETECTED 08/24/2012 1915   LABBARB NONE DETECTED 08/24/2012 1915    Alcohol Level:  Recent Labs Lab 08/24/12 1832  ETH <11   Urinalysis:  Recent Labs Lab 08/24/12 1915  COLORURINE YELLOW  LABSPEC 1.015  PHURINE 8.0  GLUCOSEU NEGATIVE  HGBUR NEGATIVE  BILIRUBINUR NEGATIVE  KETONESUR NEGATIVE  PROTEINUR NEGATIVE  UROBILINOGEN 2.0*  NITRITE NEGATIVE  LEUKOCYTESUR TRACE*    Micro Results: Recent Results (from the past 240 hour(s))  URINE CULTURE     Status: None   Collection Time    08/24/12  7:15 PM      Result Value Range Status   Specimen Description URINE, CLEAN CATCH   Final   Special Requests NONE   Final   Culture  Setup Time 08/25/2012 02:40   Final   Colony Count >=100,000 COLONIES/ML   Final    Culture     Final   Value: STAPHYLOCOCCUS SPECIES     Note: RIFAMPIN AND GENTAMICIN SHOULD NOT BE USED AS SINGLE DRUGS FOR TREATMENT OF STAPH INFECTIONS.   Report Status PENDING   Incomplete  CULTURE, BLOOD (ROUTINE X 2)     Status: None   Collection Time    08/24/12  8:40 PM      Result Value Range Status   Specimen Description BLOOD RIGHT ARM   Final   Special Requests BOTTLES DRAWN AEROBIC AND ANAEROBIC 10CC EA   Final   Culture  Setup Time 08/25/2012 14:14   Final   Culture     Final   Value:        BLOOD CULTURE RECEIVED NO GROWTH TO DATE CULTURE WILL BE HELD FOR 5 DAYS BEFORE ISSUING A FINAL NEGATIVE REPORT   Report Status PENDING   Incomplete  CULTURE, BLOOD (ROUTINE X 2)     Status: None   Collection Time    08/24/12  8:45 PM      Result Value Range Status   Specimen Description BLOOD RIGHT HAND   Final   Special Requests BOTTLES DRAWN AEROBIC AND ANAEROBIC 10CC EA   Final   Culture  Setup Time 08/25/2012 14:14   Final   Culture     Final   Value:        BLOOD CULTURE RECEIVED NO GROWTH TO DATE CULTURE WILL BE HELD FOR 5 DAYS BEFORE ISSUING A FINAL NEGATIVE REPORT   Report Status PENDING   Incomplete  MRSA PCR SCREENING     Status: None   Collection Time    08/24/12 10:35 PM      Result Value Range Status   MRSA by PCR NEGATIVE  NEGATIVE Final   Comment:            The GeneXpert MRSA Assay (FDA     approved for NASAL specimens     only), is one component of a     comprehensive MRSA colonization     surveillance program. It is not     intended to diagnose MRSA     infection nor to guide or  monitor treatment for     MRSA infections.   Studies/Results: Ct Abdomen Pelvis Wo Contrast  08/25/2012   *RADIOLOGY REPORT*  Clinical Data: Painless jaundice  CT ABDOMEN AND PELVIS WITHOUT CONTRAST  Technique:  Multidetector CT imaging of the abdomen and pelvis was performed following the standard protocol without intravenous contrast.  Comparison: Ultrasound abdomen  08/25/2012.  CT chest 06/04/2006.  Findings: The lung bases:  There is some respiratory motion. Linear scarring or atelectasis is seen adjacent to the major fissure on the right.  There is dependent atelectasis in both lower lobes and focal atelectasis or scarring laterally in the left lower lobe.  Coronary artery atherosclerotic calcifications are present. Aortic valve annular calcifications are noted.  Heart size appears within normal limits. Trace right pleural effusion.  Innumerable tiny gallstones are seen filling approximately half the volume of the gallbladder.  These were seen on today's ultrasound. Gallbladder wall thickening is visible by CT along the anterior aspect of the gallbladder, and most accurately measured on ultrasound (see today's abdominal ultrasound). It is noted that this gallbladder wall thickening appears unchanged compared to a chest CT of March 2008.  No pericholecystic fluid collection or definite stranding is identified.  The liver is normal in size.  No intrahepatic biliary ductal dilatation is appreciated.  On image number 37 there is rounded high density measuring 6 mm within the expected location of the common bile duct, proximal to the pancreatic head.  Please note that the common bile duct course is somewhat difficult to follow, as it is not dilated.  This density is seen on coronal image number 34.  On image #45 of the axial images is a dense calcification measuring 4 mm that is in the expected location of the distal common bile duct, near the ampulla, at junction with the second portion of the duodenum.  See coronal image number 37.  The pancreas demonstrates slightly slight mild fatty atrophy.  No pancreatic mass or ductal dilatation is seen.  There are no pancreatic calcifications or fluid collections.  No peripancreatic inflammatory change.  The spleen and adrenal glands are within normal limits.  Both kidneys demonstrate some cortical thinning and nonspecific perinephric  stranding.  There is no hydronephrosis or evidence of mass on noncontrast CT.  Negative for renal stones.  There are some vascular calcifications in the branches of the renal arteries. Both ureters are normal in caliber.  No ureteral stone identified. Urinary bladder normal.  Prostate gland mildly prominent.  Small bowel loops are normal in caliber.  Terminal ileum normal. Appendix not definitely visualized.  No pericecal inflammatory changes.  There are diverticular changes of the descending colon and sigmoid colon, without evidence of inflammation.  There is atherosclerotic calcification of the abdominal aorta and iliac vasculature, without aneurysm.  There is atherosclerotic calcification scattered in the superior mesenteric artery and the proximal renal arteries.  Negative for ascites or lymphadenopathy.  There are multilevel degenerative changes of the spine.  No acute or suspicious bony abnormality.  IMPRESSION:  1.  Findings suspicious for choledocholithiasis.  Two separate rounded high density structures are seen within the expected location of the common bile duct, one just proximal to the pancreatic head, and one near the ampulla.  These densities measure 6 and 4 mm, respectively.  No defininite associated biliary ductal dilatation is identified. 2.  Cholelithiasis.  Mild gallbladder wall thickening is present, as described on today's abdominal ultrasound.  The gallbladder wall thickening may be chronic, as it appears unchanged  dating back to chest CT of 2008.  Acute cholecystitis cannot be excluded by imaging, but is certainly not definite.  There are no surrounding inflammatory changes.  Suggest clinical correlation. 3.  Coronary artery and abdominal and pelvic atherosclerosis. 4.  Colonic diverticulosis without acute inflammatory changes. 5.  Mild prostamegaly. 6.  Pancreas appears within normal limits. 7.  Trace right pleural effusion   Original Report Authenticated By: Britta Mccreedy, M.D.    Medications: I have reviewed the patient's current medications. Scheduled Meds: . aspirin EC  81 mg Oral Daily  . imipenem-cilastatin  250 mg Intravenous Q6H  . metoprolol tartrate  50 mg Oral BID  . pantoprazole  40 mg Oral Daily  . sodium chloride  3 mL Intravenous Q12H  . vancomycin  1,000 mg Intravenous Q24H   Continuous Infusions:  PRN Meds:.ondansetron (ZOFRAN) IV, ondansetron Assessment/Plan:  Atrial fibrillation with rapid ventricular response - Persistent. He did not tolerate diltiazem drip well with hypotension and is now off this medication. He was started on metoprolol 25mg  but his heart rate is consistently above 100s and he is in A. Fib this morning. His blood pressure has been stable overnight and it is in the 120s/70s.   -Continue telemetry -2D echo ordered -Increased metoprolol to 75mg  BID  -Still unclear precipitant for the RVR   Hyperbilirubinemia - Unclear source and alk phos is normal which is surprising. No clear mass or biliary dilation on US abdomen or CT abdomen pelvis but two possibly obstructing gallstones. Will continue to monitor LFTs daily. Hepatitis panel negative. MRCP remarkable for choledocholithiasis and possible cholecystitis.  -Appreciate GI consult help managing this patient -CMP and PT/INR in AM  UTI - UA with pyuria, urine culture with Staph aureus. Susceptibility pending.  -Continue Vancomycin for UTI (for coverage of possible MRSA)  SIRS (systemic inflammatory response syndrome) - Resolved (afebrile, with WBC wnl, but persistent A.fib with RVR). Urine culture positive for Staph aureus, ssusceptability pending. Blood cultures with NGTD. He had pain in RUQ on admission and elevated LFTs. His bilirubin trending up is concerning, with CT abdomen/pelvis without contrast with findings suspicious for choledocholithiasis. -Fluids for hypotension prn  -Continue imipenem as pt is allergic to penicillin  -Added vancomycin back for UTI that could be due  to MRSA  GERD - Protonix daily and stable.   CKD (chronic kidney disease), stage III - Creatinine 1.9 on admission, trending down, to 1.4 today. Will monitor closely.   Generalized weakness - He complained of generalized weakness on admission. UTI could be a contributing factor as well as A.fib with RVR. PT/OT recommend 24 hr supervision/SNF. I was able to meet with his wife later this morning. His wife explains that their son is currently taking care of his wife who is in hospice care at home and she is unable to care for Mr. Kiesel by herself. She is open to finding options for possible Rehab placement after his discharge.  -Social worker consult for possible placement.  Hearing impairment - Likely secondary to bilateral cerumen impaction. His wife tells me that he had his ears "cleaned out" by ENT last ear and he is hearing was "good" then. He does have bilateral cerumen impaction on physical exam.  -Debrox BID bilaterally -Ear irrigation bilaterally   Dispo: Disposition is deferred at this time, awaiting improvement of current medical problems.  Anticipated discharge in approximately 2-3 day(s).   The patient does have a current PCP Mathis Dad, MD) and does need an [facility] hospital follow-up  appointment after discharge.  The patient does not have transportation limitations that hinder transportation to clinic appointments.  .Services Needed at time of discharge: Y = Yes, Blank = No PT:   OT:   RN:   Equipment:   Other:     LOS: 3 days   Ky Barban, MD 08/27/2012, 10:40 AM

## 2012-08-27 NOTE — Progress Notes (Signed)
Internal Medicine Teaching Service Attending Note Date: 08/27/2012  Patient name: [name] Page  Medical record number: [id number]  Date of birth: 09-14-1924   [name] Page was sitting comfortably in a chair and did not seem to be in any pain when I met him today. Has not spiked fevers, but remains tachycardic in low 100s.   Filed Vitals:   08/26/12 2103 08/27/12 0312 08/27/12 0444 08/27/12 0834  BP: 125/85  119/79   Pulse: 114  122 150  Temp:   97.7 F (36.5 C)   TempSrc:   Oral   Resp:   18   Height:      Weight:  169 lb 5 oz (76.8 kg)    SpO2:   97%    General: No acute distress Eyes: Mild scleral icterus Heart: S1S2 Tachycardic, no murmur heard Lungs: clear Abdomen: Soft, non-tender, BS+ Extremities: No pedal edema, good pulses   Recent Labs Lab 08/24/12 1832 08/25/12 0530 08/26/12 0440 08/27/12 0515  AST 109* 64* 55* 63*  ALT 109* 78* 65* 65*  ALKPHOS 161* 117 117 123*  BILITOT 3.2* 4.0* 5.2* 6.3*  PROT 7.1 5.6* 6.4 6.4  ALBUMIN 3.5 2.8* 3.1* 3.0*  INR 1.04  --   --   --     Recent Labs Lab 08/25/12 0530 08/26/12 0440 08/27/12 0515  HGB 11.8* 13.0 13.1  HCT 32.8* 35.9* 36.1*  WBC 11.9* 6.9 9.4  PLT 127* 115* 124*    Recent Labs Lab 08/24/12 1832 08/24/12 1849 08/24/12 2031 08/24/12 2308 08/25/12 0530 08/26/12 0440 08/27/12 0515  NA 136 140  --   --  137 136 134*  K 3.5 3.6  --   --  3.3* 3.8 3.7  CL 100 104  --   --  102 105 102  CO2 24  --   --   --  25 20 23   GLUCOSE 121* 121*  --   --  112* 96 110*  BUN 27* 26*  --   --  29* 24* 19  CREATININE 1.79* 1.90*  --  1.69* 1.83* 1.63* 1.46*  CALCIUM 9.0  --   --   --  8.0* 8.3* 8.7  MG  --   --  1.6  --  2.5  --   --     Scheduled Medications . aspirin EC  81 mg Oral Daily  . imipenem-cilastatin  250 mg Intravenous Q6H  . metoprolol tartrate  75 mg Oral BID  . pantoprazole  40 mg Oral Daily  . sodium chloride  3 mL Intravenous Q12H  . vancomycin  1,000 mg Intravenous Q24H     Assessment and Plan   Hyperbilirubinemia, trending up, direct bili predominant - the patient does show signs of unremitting blockage and according to plan, since the bilirubin is still rising, we would touch base with GI about MRCP.    Staph aureus UTI - with Vancomycin, WBC is trending down. No fevers. Patient does not have a catheter. Sensitivities awaited. Last UTI, last year, Enterococcus was islolated.   Rapid Afib - Tachycardic on Metoprolol 50 BID up to low 100s. ECHO pending. Agree with going up on Metoprolol to 75 BID.  Blood pressure stable on metoprolol. No fever measured.   Falen Lehrmann 08/27/2012, 11:10 AM.

## 2012-08-27 NOTE — Progress Notes (Signed)
Echocardiogram 2D Echocardiogram has been performed.  [name] 08/27/2012, 4:01 PM

## 2012-08-27 NOTE — Progress Notes (Signed)
Occupational Therapy Evaluation Patient Details Name: DIANA ARMIJO MRN: [id number] DOB: 1924-06-14 Today's Date: 08/27/2012 Time: 4098-1191 OT Time Calculation (min): 27 min  OT Assessment / Plan / Recommendation Clinical Impression  Admitted with Afib with SIRS, UTI and cholelithiasis. Pt is HOH. PTA, pt independent with ADL and mobility. Pt lives with elderly wife. Unsure of her ability to care for her husband. discussed rehab @SNF  with pt and pt states that he wants to go home. If pt refuses SNF, recommend HHOT. Pt will benefit from skilled OT services to facilitate D/C to next venue due to below deficits.    OT Assessment  Patient needs continued OT Services    Follow Up Recommendations  Home health OT;Other (comment) (pt refusing SNF per our conversation)    Barriers to Discharge Other (comment) (unsure of family support) elderly wife  Equipment Recommendations  3 in 1 bedside comode    Recommendations for Other Services    Frequency  Min 2X/week    Precautions / Restrictions Precautions Precautions: Fall   Pertinent Vitals/Pain no apparent distress     ADL  Grooming: Supervision/safety Where Assessed - Grooming: Unsupported standing Upper Body Bathing: Set up Where Assessed - Upper Body Bathing: Unsupported sitting Lower Body Bathing: Minimal assistance Where Assessed - Lower Body Bathing: Supported sit to stand Upper Body Dressing: Set up Where Assessed - Upper Body Dressing: Unsupported sitting Lower Body Dressing: Minimal assistance Where Assessed - Lower Body Dressing: Supported sit to stand Toilet Transfer: Min Pension scheme manager Method: Other (comment) (ambulating) Acupuncturist: Bedside commode;Other (comment) (over toilet) Toileting - Clothing Manipulation and Hygiene: Modified independent Where Assessed - Toileting Clothing Manipulation and Hygiene: Sit to stand from 3-in-1 or toilet Equipment Used: Gait belt;Rolling  walker Transfers/Ambulation Related to ADLs: min A form lower surface ADL Comments: unsafe use of RW. Easily fatigued. States wife can help him    OT Diagnosis: Generalized weakness;Acute pain  OT Problem List: Decreased strength;Decreased activity tolerance;Decreased safety awareness;Decreased knowledge of use of DME or AE;Cardiopulmonary status limiting activity;Pain OT Treatment Interventions: Self-care/ADL training;Therapeutic exercise;Energy conservation;DME and/or AE instruction;Therapeutic activities;Patient/family education   OT Goals Acute Rehab OT Goals OT Goal Formulation: With patient Time For Goal Achievement: 09/10/12 Potential to Achieve Goals: Good ADL Goals Pt Will Perform Lower Body Bathing: with set-up;with supervision;Sit to stand from bed ADL Goal: Lower Body Bathing - Progress: Goal set today Pt Will Perform Lower Body Dressing: with set-up;with supervision;Sit to stand from bed ADL Goal: Lower Body Dressing - Progress: Goal set today Pt Will Transfer to Toilet: with supervision;Ambulation;with DME;3-in-1 ADL Goal: Toilet Transfer - Progress: Goal set today Pt Will Perform Tub/Shower Transfer: with caregiver independent in assisting (discussion of safest tub/shower technique) ADL Goal: Tub/Shower Transfer - Progress: Goal set today  Visit Information  Last OT Received On: 08/27/12    Subjective Data      Prior Functioning     Home Living Lives With: Spouse Available Help at Discharge: Family;Available 24 hours/day Type of Home: House Home Access: Stairs to enter [employer] of Steps: 4 Entrance Stairs-Rails: Right;Left;Can reach both Home Layout: One level Bathroom Shower/Tub: Health visitor: Standard Bathroom Accessibility: Yes How Accessible: Accessible via walker Home Adaptive Equipment: Walker - rolling Prior Function Level of Independence: Needs assistance Needs Assistance: Light Housekeeping;Meal Prep Meal Prep:  Total Light Housekeeping: Total Able to Take Stairs?: Yes Driving: No Vocation: Retired Musician: HOH         Vision/Perception Vision - History Baseline  Vision: No visual deficits   Cognition  Cognition Arousal/Alertness: Awake/alert Behavior During Therapy: WFL for tasks assessed/performed Overall Cognitive Status: No family/caregiver present to determine baseline cognitive functioning    Extremity/Trunk Assessment Right Upper Extremity Assessment RUE ROM/Strength/Tone: Within functional levels Left Upper Extremity Assessment LUE ROM/Strength/Tone: Within functional levels Right Lower Extremity Assessment RLE ROM/Strength/Tone: Deficits Left Lower Extremity Assessment LLE ROM/Strength/Tone: Deficits Trunk Assessment Trunk Assessment: Kyphotic     Mobility Bed Mobility Bed Mobility: Supine to Sit;Sitting - Scoot to Edge of Bed Supine to Sit: 6: Modified independent (Device/Increase time) Sitting - Scoot to Edge of Bed: 6: Modified independent (Device/Increase time) Transfers Transfers: Sit to Stand;Stand to Sit Sit to Stand: 4: Min assist;With upper extremity assist;From bed Details for Transfer Assistance: cuing for safety. unsafe descent to chair. most likely his baseline     Exercise     Balance  min guard   End of Session OT - End of Session Equipment Utilized During Treatment: Gait belt Activity Tolerance: Patient tolerated treatment well Patient left: in chair;with call bell/phone within reach;with chair alarm set Nurse Communication: Mobility status  GO     Kennie Karapetian,HILLARY 08/27/2012, 5:10 PM [facility], OTR/L  [phone] 08/27/2012

## 2012-08-27 NOTE — Evaluation (Signed)
Physical Therapy Evaluation Patient Details Name: DELANO FRATE MRN: [id number] DOB: 1924/06/18 Today's Date: 08/27/2012 Time: [id number] PT Time Calculation (min): 16 min  PT Assessment / Plan / Recommendation Clinical Impression  Pt admitted with Afib with SIRS, UTI and cholelithiasis. Pt is HOH and exhibits decreased safety awareness. pt states wife can physically assist at home but unsure how accurate this is and pt may require ST-SNF prior to home with family. Pt with LOB standing to urinate with mod assist to maintain balance and cueing for all mobility. Pt will benefit from acute therapy to maximize mobility, gait, balance and function prior to discharge.     PT Assessment  Patient needs continued PT services    Follow Up Recommendations  Supervision/Assistance - 24 hour;SNF (pending family ability to assist and progress)    Does the patient have the potential to tolerate intense rehabilitation      Barriers to Discharge Decreased caregiver support      Equipment Recommendations  None recommended by PT    Recommendations for Other Services OT consult   Frequency Min 3X/week    Precautions / Restrictions Precautions Precautions: Fall   Pertinent Vitals/Pain Pt reports RLE pain but unable to rate grossly 4/10 HR up to 150 with gait and limited distance      Mobility  Bed Mobility Bed Mobility: Supine to Sit;Sitting - Scoot to Edge of Bed Supine to Sit: 5: Supervision;HOB flat;With rails Sitting - Scoot to Edge of Bed: 5: Supervision Transfers Transfers: Sit to Stand;Stand to Sit Sit to Stand: 4: Min guard;From bed Stand to Sit: 4: Min guard;To chair/3-in-1 Details for Transfer Assistance: cueing for hand placement and safety with increased time to perform and not reaching for surface with descent Ambulation/Gait Ambulation/Gait Assistance: 4: Min assist Ambulation Distance (Feet): 100 Feet Assistive device: Rolling walker Ambulation/Gait Assistance Details: pt  walking on right toe without heel down, shuffle gait and flexed posture with cueing for direction, distance limited by tachycardia Gait Pattern: Shuffle;Trunk flexed;Decreased dorsiflexion - right Gait velocity: decreased Stairs: No    Exercises     PT Diagnosis: Difficulty walking  PT Problem List: Decreased strength;Decreased mobility;Decreased activity tolerance;Decreased balance;Cardiopulmonary status limiting activity;Decreased knowledge of use of DME;Decreased safety awareness PT Treatment Interventions: Gait training;Stair training;DME instruction;Therapeutic activities;Therapeutic exercise;Functional mobility training;Balance training;Patient/family education   PT Goals Acute Rehab PT Goals PT Goal Formulation: With patient Pt will go Sit to Stand: with modified independence PT Goal: Sit to Stand - Progress: Goal set today Pt will go Stand to Sit: with modified independence PT Goal: Stand to Sit - Progress: Goal set today Pt will Ambulate: >150 feet;with supervision;with rolling walker PT Goal: Ambulate - Progress: Goal set today Pt will Go Up / Down Stairs: 3-5 stairs;with supervision;with rail(s) PT Goal: Up/Down Stairs - Progress: Goal set today  Visit Information  Last PT Received On: 08/27/12 Assistance Needed: +1    Subjective Data  Subjective: I can't hear you Patient Stated Goal: go home   Prior Functioning  Home Living Lives With: Spouse Available Help at Discharge: Family;Available 24 hours/day Type of Home: House Home Access: Stairs to enter [employer] of Steps: 4 Entrance Stairs-Rails: Right;Left;Can reach both Home Layout: One level Bathroom Shower/Tub: Health visitor: Standard Home Adaptive Equipment: Walker - rolling Prior Function Level of Independence: Needs assistance Needs Assistance: Light Housekeeping;Meal Prep Meal Prep: Total Light Housekeeping: Total Able to Take Stairs?: Yes Driving: No Vocation:  Retired Musician: Clinical cytogeneticist  Cognition Arousal/Alertness: Awake/alert Behavior During Therapy: WFL for tasks assessed/performed Overall Cognitive Status: Impaired/Different from baseline Area of Impairment: Safety/judgement Safety/Judgement: Decreased awareness of safety    Extremity/Trunk Assessment Right Lower Extremity Assessment RLE ROM/Strength/Tone: Deficits RLE ROM/Strength/Tone Deficits: grossly 3/5 not formally assessed, pt with inability for heel down with gait due to pain Left Lower Extremity Assessment LLE ROM/Strength/Tone: Deficits LLE ROM/Strength/Tone Deficits: grossly 3/5 Trunk Assessment Trunk Assessment: Kyphotic   Balance    End of Session PT - End of Session Equipment Utilized During Treatment: Gait belt Activity Tolerance: Treatment limited secondary to medical complications (Comment) Patient left: in chair;with call bell/phone within reach Nurse Communication: Mobility status  GP     Delorse Lek 08/27/2012, 8:41 AM  Delaney Meigs, PT [phone]

## 2012-08-27 NOTE — Progress Notes (Signed)
Patient ID: [name] Page, male   DOB: Sep 12, 1924, 79 y.o.   MRN: [id number] [facility] Progress Note  [name] Page 79 y.o. 03-04-1925   Subjective: Patient lying in bed awake. Denies any abdominal pain. Feels good. Tolerating solid foods.  Objective: Vital signs in last 24 hours: Filed Vitals:   08/27/12 0834  BP: 119/79  Pulse: 150  Temp: 97.7  Resp: 18    Physical Exam: Gen: alert, no acute distress, oriented X 3 HEENT: scleral icterus Abd: soft, nontender, nondistended  Lab Results:  Recent Labs  08/24/12 2031  08/25/12 0530 08/26/12 0440 08/27/12 0515  NA  --   --  137 136 134*  K  --   --  3.3* 3.8 3.7  CL  --   --  102 105 102  CO2  --   --  25 20 23   GLUCOSE  --   --  112* 96 110*  BUN  --   --  29* 24* 19  CREATININE  --   < > 1.83* 1.63* 1.46*  CALCIUM  --   --  8.0* 8.3* 8.7  MG 1.6  --  2.5  --   --   < > = values in this interval not displayed.  Recent Labs  08/26/12 0440 08/27/12 0515  AST 55* 63*  ALT 65* 65*  ALKPHOS 117 123*  BILITOT 5.2* 6.3*  PROT 6.4 6.4  ALBUMIN 3.1* 3.0*    Recent Labs  08/24/12 1832  08/26/12 0440 08/27/12 0515  WBC 12.0*  < > 6.9 9.4  NEUTROABS 10.2*  --   --   --   HGB 14.4  < > 13.0 13.1  HCT 39.2  < > 35.9* 36.1*  MCV 97.3  < > 97.0 97.6  PLT 144*  < > 115* 124*  < > = values in this interval not displayed.  Recent Labs  08/24/12 1832  LABPROT 13.5  INR 1.04      Assessment/Plan: 79 yo with jaundice and rising transaminases with MRCP showing nonobstructing small CBD stones (no biliary dilation). I discussed the risks/benefits of an ERCP with him and recommended he have an ERCP at this time due to his rising LFTs and CBD stones and high likelihood of more stones getting into the CBD from the gallbladder, which could cause cholangitis and/or pancreatitis. After discussion of the risks of not removing these stones including but not limited to death and infection, he chooses to NOT have  an ERCP done. He says he has "lived a good life and is ready to go" and does NOT want to undergo this procedure even if death occurs from not having it done. Will relay his desires to primary team so code status can be readdressed by them. Please let us know if his desires change or if further help needed.   Petar Mucci C. 08/27/2012, 2:45 PM

## 2012-08-27 NOTE — Progress Notes (Addendum)
Discussed code status with [name] Page and he states that he does not want CPR or intubation. I called his wife, [name] Page, and she confirmed that he does not want CPR or intubation. DNR order placed.   In regards to his decision to NOT have ERCP [name] Page tells me that he has not informed his family of this decision. With his permission, I discussed this decision with his wife. She tells me that she is worried because her brother died of from acute cholecystitis and does not want this to happen to her husband. [name] Page talked to his wife on the phone and agreed to discuss this matter with his son before his final decision on the ERCP.

## 2012-08-28 ENCOUNTER — Encounter (HOSPITAL_COMMUNITY): Payer: Self-pay | Admitting: Anesthesiology

## 2012-08-28 LAB — CBC
MCH: 35.2 pg — ABNORMAL HIGH (ref 26.0–34.0)
MCV: 98.4 fL (ref 78.0–100.0)
Platelets: 133 10*3/uL — ABNORMAL LOW (ref 150–400)
RDW: 13.4 % (ref 11.5–15.5)

## 2012-08-28 LAB — COMPREHENSIVE METABOLIC PANEL
AST: 55 U/L — ABNORMAL HIGH (ref 0–37)
Albumin: 3 g/dL — ABNORMAL LOW (ref 3.5–5.2)
CO2: 23 mEq/L (ref 19–32)
Calcium: 8.9 mg/dL (ref 8.4–10.5)
Creatinine, Ser: 1.42 mg/dL — ABNORMAL HIGH (ref 0.50–1.35)
GFR calc non Af Amer: 43 mL/min — ABNORMAL LOW (ref >90)

## 2012-08-28 LAB — TYPE AND SCREEN: ABO/RH(D): O NEG

## 2012-08-28 LAB — PROTIME-INR: INR: 1.08 (ref 0.00–1.49)

## 2012-08-28 MED ORDER — SODIUM CHLORIDE 0.9 % IV SOLN
INTRAVENOUS | Status: DC
  Administered 2012-08-28: 22:00:00 via INTRAVENOUS

## 2012-08-28 MED ORDER — METOPROLOL TARTRATE 100 MG PO TABS
100.0000 mg | ORAL_TABLET | Freq: Two times a day (BID) | ORAL | Status: DC
  Administered 2012-08-28 – 2012-08-29 (×2): 100 mg via ORAL
  Filled 2012-08-28 (×3): qty 1

## 2012-08-28 MED ORDER — DOXYCYCLINE HYCLATE 100 MG PO TABS
100.0000 mg | ORAL_TABLET | Freq: Two times a day (BID) | ORAL | Status: DC
  Administered 2012-08-28: 100 mg via ORAL
  Filled 2012-08-28 (×2): qty 1

## 2012-08-28 NOTE — Progress Notes (Signed)
Patient ID: [name] Page, male   DOB: 1924-10-25, 79 y.o.   MRN: [id number] [facility] Progress Note  [name] Page 79 y.o. [birth year]   Subjective: Feels good. Denies pain. Tolerating solid food.  Objective: Vital signs in last 24 hours: Filed Vitals:   08/28/12 1314  BP: 131/81  Pulse: 102  Temp: 97.9 F (36.6 C)  Resp: 20    Physical Exam: Gen: alert, no acute distress Abd: soft, NT, ND, +BS  Lab Results:  Recent Labs  08/27/12 0515 08/28/12 0515  NA 134* 133*  K 3.7 3.3*  CL 102 102  CO2 23 23  GLUCOSE 110* 99  BUN 19 19  CREATININE 1.46* 1.42*  CALCIUM 8.7 8.9    Recent Labs  08/27/12 0515 08/28/12 0515  AST 63* 55*  ALT 65* 59*  ALKPHOS 123* 133*  BILITOT 6.3* 7.3*  PROT 6.4 6.5  ALBUMIN 3.0* 3.0*    Recent Labs  08/27/12 0515 08/28/12 0515  WBC 9.4 6.4  HGB 13.1 13.5  HCT 36.1* 37.8*  MCV 97.6 98.4  PLT 124* 133*    Recent Labs  08/28/12 0515  LABPROT 13.9  INR 1.08      Assessment/Plan: 79 yo with jaundice likely due to CBD stones although no biliary dilation and no abdominal pain. Needs ERCP for sphincterotomy and stone removal. Planned for tomorrow morning 730 AM by Dr. Ewing Schlein.   Joni Colegrove C. 08/28/2012, 1:36 PM

## 2012-08-28 NOTE — Progress Notes (Addendum)
Subjective: He has no complaints. His son came by and talked to him in regards to ERCP and he is now willing to undergo this procedure so he can "get better".  Objective: Vital signs in last 24 hours: Filed Vitals:   08/27/12 0834 08/27/12 2044 08/28/12 0336 08/28/12 1057  BP:  133/91 141/94 145/80  Pulse: 150 129 96 135  Temp:  97.9 F (36.6 C) 97.7 F (36.5 C)   TempSrc:  Oral Oral   Resp:  19 19   Height:      Weight:   169 lb 12.1 oz (77 kg)   SpO2:  94% 96%    Weight change: 7.1 oz (0.2 kg)  Intake/Output Summary (Last 24 hours) at 08/28/12 1108 Last data filed at 08/28/12 0730  Gross per 24 hour  Intake    480 ml  Output   1800 ml  Net  -1320 ml   Vitals reviewed. General: hard of hearing, resting in bed, in NAD HEENT: scleral icterus, red left conjunctiva (improving) Cardiac: regular rate, irregular rhythm, no M/R/G but distant heart sounds secondary to his body habitus Pulm: clear to auscultation bilaterally, no wheezes, rales, or rhonchi Abd: soft, nontender, nondistended, BS present Ext: warm and well perfused, no pedal edema Neuro: alert and oriented X3, moves 4 extremities voluntarily   Lab Results: Basic Metabolic Panel:  Recent Labs Lab 08/24/12 2031  08/25/12 0530  08/27/12 0515 08/28/12 0515  NA  --   --  137  < > 134* 133*  K  --   --  3.3*  < > 3.7 3.3*  CL  --   --  102  < > 102 102  CO2  --   --  25  < > 23 23  GLUCOSE  --   --  112*  < > 110* 99  BUN  --   --  29*  < > 19 19  CREATININE  --   < > 1.83*  < > 1.46* 1.42*  CALCIUM  --   --  8.0*  < > 8.7 8.9  MG 1.6  --  2.5  --   --   --   < > = values in this interval not displayed. Liver Function Tests:  Recent Labs Lab 08/27/12 0515 08/28/12 0515  AST 63* 55*  ALT 65* 59*  ALKPHOS 123* 133*  BILITOT 6.3* 7.3*  PROT 6.4 6.5  ALBUMIN 3.0* 3.0*    Recent Labs Lab 08/24/12 2308  LIPASE 25   CBC:  Recent Labs Lab 08/24/12 1832  08/27/12 0515 08/28/12 0515  WBC 12.0*   < > 9.4 6.4  NEUTROABS 10.2*  --   --   --   HGB 14.4  < > 13.1 13.5  HCT 39.2  < > 36.1* 37.8*  MCV 97.3  < > 97.6 98.4  PLT 144*  < > 124* 133*  < > = values in this interval not displayed. Cardiac Enzymes:  Recent Labs Lab 08/24/12 1833 08/24/12 2300 08/25/12 0530  TROPONINI <0.30 <0.30 <0.30   CBG:  Recent Labs Lab 08/24/12 2232 08/25/12 0744 08/25/12 1156 08/25/12 1721 08/25/12 2100 08/26/12 0728  GLUCAP 142* 92 116* 104* 102* 90   Hemoglobin A1C:  Recent Labs Lab 08/24/12 2031  HGBA1C 5.2   Fasting Lipid Panel:  Recent Labs Lab 08/24/12 2300  CHOL 142  HDL 48  LDLCALC 84  TRIG 49  CHOLHDL 3.0   Thyroid Function Tests:  Recent Labs Lab  08/24/12 2031 08/26/12 1454  TSH 0.966  --   FREET4  --  1.21   Coagulation:  Recent Labs Lab 08/24/12 1832 08/28/12 0515  LABPROT 13.5 13.9  INR 1.04 1.08   Urine Drug Screen: Drugs of Abuse     Component Value Date/Time   LABOPIA NONE DETECTED 08/24/2012 1915   COCAINSCRNUR NONE DETECTED 08/24/2012 1915   LABBENZ NONE DETECTED 08/24/2012 1915   AMPHETMU NONE DETECTED 08/24/2012 1915   THCU NONE DETECTED 08/24/2012 1915   LABBARB NONE DETECTED 08/24/2012 1915    Alcohol Level:  Recent Labs Lab 08/24/12 1832  ETH <11   Urinalysis:  Recent Labs Lab 08/24/12 1915  COLORURINE YELLOW  LABSPEC 1.015  PHURINE 8.0  GLUCOSEU NEGATIVE  HGBUR NEGATIVE  BILIRUBINUR NEGATIVE  KETONESUR NEGATIVE  PROTEINUR NEGATIVE  UROBILINOGEN 2.0*  NITRITE NEGATIVE  LEUKOCYTESUR TRACE*     Micro Results: Recent Results (from the past 240 hour(s))  URINE CULTURE     Status: None   Collection Time    08/24/12  7:15 PM      Result Value Range Status   Specimen Description URINE, CLEAN CATCH   Final   Special Requests NONE   Final   Culture  Setup Time 08/25/2012 02:40   Final   Colony Count >=100,000 COLONIES/ML   Final   Culture     Final   Value: STAPHYLOCOCCUS SPECIES (COAGULASE NEGATIVE)     Note:  RIFAMPIN AND GENTAMICIN SHOULD NOT BE USED AS SINGLE DRUGS FOR TREATMENT OF STAPH INFECTIONS.   Report Status 08/27/2012 FINAL   Final   Organism ID, Bacteria STAPHYLOCOCCUS SPECIES (COAGULASE NEGATIVE)   Final  CULTURE, BLOOD (ROUTINE X 2)     Status: None   Collection Time    08/24/12  8:40 PM      Result Value Range Status   Specimen Description BLOOD RIGHT ARM   Final   Special Requests BOTTLES DRAWN AEROBIC AND ANAEROBIC 10CC EA   Final   Culture  Setup Time 08/25/2012 14:14   Final   Culture     Final   Value:        BLOOD CULTURE RECEIVED NO GROWTH TO DATE CULTURE WILL BE HELD FOR 5 DAYS BEFORE ISSUING A FINAL NEGATIVE REPORT   Report Status PENDING   Incomplete  CULTURE, BLOOD (ROUTINE X 2)     Status: None   Collection Time    08/24/12  8:45 PM      Result Value Range Status   Specimen Description BLOOD RIGHT HAND   Final   Special Requests BOTTLES DRAWN AEROBIC AND ANAEROBIC 10CC EA   Final   Culture  Setup Time 08/25/2012 14:14   Final   Culture     Final   Value:        BLOOD CULTURE RECEIVED NO GROWTH TO DATE CULTURE WILL BE HELD FOR 5 DAYS BEFORE ISSUING A FINAL NEGATIVE REPORT   Report Status PENDING   Incomplete  MRSA PCR SCREENING     Status: None   Collection Time    08/24/12 10:35 PM      Result Value Range Status   MRSA by PCR NEGATIVE  NEGATIVE Final   Comment:            The GeneXpert MRSA Assay (FDA     approved for NASAL specimens     only), is one component of a     comprehensive MRSA colonization     surveillance program. It  is not     intended to diagnose MRSA     infection nor to guide or     monitor treatment for     MRSA infections.   Studies/Results: Mr Mrcp  08/27/2012   *RADIOLOGY REPORT*  Clinical Data:  Jaundice  MRI ABDOMEN WITHOUT  CONTRAST (INCLUDING MRCP)  Technique:  Multiplanar multisequence MR imaging of the abdomen was performed both without the administration of intravenous contrast. Heavily T2-weighted images of the biliary and  pancreatic ducts were obtained, and three-dimensional MRCP images were rendered by post processing.  Comparison:  08/25/2012  Findings:  There are bilateral pleural effusions present, left greater than right.  The gallbladder is distended and there is mild gallbladder wall edema which measures up to 8 mm in thickness. Multiple tiny stones are identified throughout the lumen of the gallbladder.  No intrahepatic bile duct dilatation.  The common bile duct has a normal caliber.  Small filling defects within the distal portion of the common bile duct are noted consistent with choledocholithiasis.  The largest stone measures 4 mm.  No pancreatic ductal dilatation.  Normal appearance of the pancreas.  The spleen is normal.  The adrenal glands are unremarkable.  The right kidney is normal. The left kidney is normal  No free fluid identified within the abdomen.  No focal fluid collections identified.  No upper abdominal adenopathy.  IMPRESSION:  1.  Gallstones and choledocholithiasis. 2.  Thickening of the gallbladder wall which may be indicative of cholecystitis.   Original Report Authenticated By: Signa Kell, M.D.   Medications: I have reviewed the patient's current medications. Scheduled Meds: . aspirin EC  81 mg Oral Daily  . carbamide peroxide  5 drop Both Ears BID  . imipenem-cilastatin  250 mg Intravenous Q6H  . metoprolol tartrate  75 mg Oral BID  . pantoprazole  40 mg Oral Daily  . sodium chloride  3 mL Intravenous Q12H  . vancomycin  1,000 mg Intravenous Q24H   Continuous Infusions:  PRN Meds:.ondansetron (ZOFRAN) IV, ondansetron Assessment/Plan:  Atrial fibrillation with rapid ventricular response - Rate controlled earlier this morning. Precipitant for RVR likely UTI and gallbladder disease. He did not tolerate diltiazem drip well with hypotension and is now off this medication. He tolerated metoprolol 75 mg well with stable BP of 141/94. His 2D echo revealed an EF of 55-60% with no wall motion  abnormality, mild left atrium enlargement.  -Continue telemetry  -Increased metoprolol to 100mg  BID   Hyperbilirubinemia - No clear mass or biliary dilation on US abdomen or CT abdomen pelvis but two possibly obstructing gallstones. Hepatitis panel negative. MRCP remarkable for choledocholithiasis and possible cholecystitis. GI has offered ERCP and the patient now agrees to this procedure. Total bilirubin trending up, INR 1.08.  -Appreciate GI consult help managing this patient  -CMP daily  UTI - UA with pyuria, urine culture with Staph aureus. Susceptibility shows MRSA sensitive to tetracycline.  -Discontinued Vancomycin  -Start doxycycline 100mg  BID for UTI  SIRS (systemic inflammatory response syndrome) - Resolved (afebrile, with WBC wnl, but persistent A.fib with RVR). Urine culture positive for Staph aureus, ssusceptability pending. Blood cultures with NGTD. He had pain in RUQ on admission and elevated LFTs. CT abdomen/pelvis without contrast with findings suspicious for choledocholithiasis. MRCP   -Fluids for hypotension prn  -Continue imipenem as pt is allergic to penicillin  -Discontinued  Vancomycin for UTI, will start doxycyline 100mg  BID once imipenem is off  GERD - Protonix daily and stable.   CKD (  chronic kidney disease), stage III - Creatinine 1.9 on admission, trending down, to 1.4 today. Will monitor closely.   Generalized weakness - He complained of generalized weakness on admission. UTI could be a contributing factor as well as A.fib with RVR. PT/OT recommend 24 hr supervision/SNF. His wife explains that their son is currently taking care of his wife who is in hospice care at home and she is unable to care for Mr. Gutzmer by herself. She is open to finding options for possible Rehab placement after his discharge.  -Social worker consult for possible placement.   Hearing impairment - Likely secondary to bilateral cerumen impaction. His wife tells me that he had his ears  "cleaned out" by ENT last year and his hearing was "good" then. He does have bilateral cerumen impaction on physical exam.  -Debrox BID bilaterally  -Ear irrigation bilaterally   Dispo: Disposition is deferred at this time, awaiting improvement of current medical problems.  Anticipated discharge in approximately 2-3 day(s) pending on ERCP and improvement of his symptoms.   The patient does have a current PCP Mathis Dad, MD) and does need an [facility] hospital follow-up appointment after discharge.  The patient does not have transportation limitations that hinder transportation to clinic appointments.  .Services Needed at time of discharge: Y = Yes, Blank = No PT:   OT:   RN:   Equipment:   Other:     LOS: 4 days   Ky Barban, MD 08/28/2012, 11:08 AM

## 2012-08-28 NOTE — Progress Notes (Signed)
Internal Medicine Teaching Service Attending Note Date: 08/28/2012  Patient name: [name] Page  Medical record number: [id number]  Date of birth: December 23, 1924   I met Mr Cuccia with our entire resident team today. He has no complaints. He was refusing ERCP, and we went over the pros and cons of the entire situation one more time with him. The patient has already talked to his family and seems to be changing his mind at this time and he wants to go ahead with the procedure.   Filed Vitals:   08/27/12 0834 08/27/12 2044 08/28/12 0336 08/28/12 1057  BP:  133/91 141/94 145/80  Pulse: 150 129 96 135  Temp:  97.9 F (36.6 C) 97.7 F (36.5 C)   TempSrc:  Oral Oral   Resp:  19 19   Height:      Weight:   169 lb 12.1 oz (77 kg)   SpO2:  94% 96%     General: Lying comfortably in bed, no distress, jaundiced Heart: S1S2 RRR, rate in high 90s.  Lungs: Clear to auscultation.   Recent Labs Lab 08/24/12 1832 08/25/12 0530 08/26/12 0440 08/27/12 0515 08/28/12 0515  AST 109* 64* 55* 63* 55*  ALT 109* 78* 65* 65* 59*  ALKPHOS 161* 117 117 123* 133*  BILITOT 3.2* 4.0* 5.2* 6.3* 7.3*  PROT 7.1 5.6* 6.4 6.4 6.5  ALBUMIN 3.5 2.8* 3.1* 3.0* 3.0*  INR 1.04  --   --   --  1.08    MRCP shows small non obstructing stones with no biliary dilation.  Scheduled hospital meds . aspirin EC  81 mg Oral Daily  . carbamide peroxide  5 drop Both Ears BID  . doxycycline  100 mg Oral Q12H  . imipenem-cilastatin  250 mg Intravenous Q6H  . metoprolol tartrate  100 mg Oral BID  . pantoprazole  40 mg Oral Daily  . sodium chloride  3 mL Intravenous Q12H    Assessment and Plan   The patient changed his mind about ERCP after talking to his family. He is ready for the procedure. We will inform GI specialists about this. Our team had addressed the code status issue yesterday and he is currently DNR.  We will continue current management. I  agree with change of antibiotic to doxycycline for UTI. Continue  Imipenem.   Rest per resident note.  Amair Shrout 08/28/2012, 11:46 AM.

## 2012-08-29 ENCOUNTER — Encounter (HOSPITAL_COMMUNITY): Payer: Self-pay | Admitting: Anesthesiology

## 2012-08-29 ENCOUNTER — Inpatient Hospital Stay (HOSPITAL_COMMUNITY): Payer: Medicare Other

## 2012-08-29 ENCOUNTER — Inpatient Hospital Stay (HOSPITAL_COMMUNITY): Payer: Medicare Other | Admitting: Anesthesiology

## 2012-08-29 ENCOUNTER — Encounter (HOSPITAL_COMMUNITY)
Admission: EM | Disposition: A | Discharge status: Skilled Nursing Facility | Payer: Self-pay | Source: Home / Self Care | Attending: Internal Medicine

## 2012-08-29 HISTORY — PX: ERCP: SHX5425

## 2012-08-29 LAB — COMPREHENSIVE METABOLIC PANEL
AST: 51 U/L — ABNORMAL HIGH (ref 0–37)
Albumin: 2.8 g/dL — ABNORMAL LOW (ref 3.5–5.2)
BUN: 22 mg/dL (ref 6–23)
CO2: 25 mEq/L (ref 19–32)
Calcium: 8.7 mg/dL (ref 8.4–10.5)
Calcium: 9 mg/dL (ref 8.4–10.5)
Chloride: 100 mEq/L (ref 96–112)
Creatinine, Ser: 1.46 mg/dL — ABNORMAL HIGH (ref 0.50–1.35)
GFR calc Af Amer: 50 mL/min — ABNORMAL LOW (ref >90)
GFR calc non Af Amer: 44 mL/min — ABNORMAL LOW (ref >90)
Glucose, Bld: 106 mg/dL — ABNORMAL HIGH (ref 70–99)
Total Protein: 6.2 g/dL (ref 6.0–8.3)
Total Protein: 7.1 g/dL (ref 6.0–8.3)

## 2012-08-29 LAB — CBC
MCH: 34.8 pg — ABNORMAL HIGH (ref 26.0–34.0)
MCHC: 35.6 g/dL (ref 30.0–36.0)
MCV: 97.6 fL (ref 78.0–100.0)
Platelets: 137 10*3/uL — ABNORMAL LOW (ref 150–400)
RDW: 13.3 % (ref 11.5–15.5)
WBC: 6.3 10*3/uL (ref 4.0–10.5)

## 2012-08-29 SURGERY — ERCP, WITH INTERVENTION IF INDICATED
Anesthesia: General

## 2012-08-29 MED ORDER — CIPROFLOXACIN IN D5W 400 MG/200ML IV SOLN
INTRAVENOUS | Status: DC | PRN
  Administered 2012-08-29: 400 mg via INTRAVENOUS

## 2012-08-29 MED ORDER — SODIUM CHLORIDE 0.9 % IV SOLN
INTRAVENOUS | Status: DC | PRN
  Administered 2012-08-29: 09:00:00

## 2012-08-29 MED ORDER — LACTATED RINGERS IV SOLN
INTRAVENOUS | Status: DC | PRN
  Administered 2012-08-29: 07:00:00 via INTRAVENOUS

## 2012-08-29 MED ORDER — LIDOCAINE HCL (CARDIAC) 20 MG/ML IV SOLN
INTRAVENOUS | Status: DC | PRN
  Administered 2012-08-29: 50 mg via INTRAVENOUS

## 2012-08-29 MED ORDER — METOPROLOL TARTRATE 25 MG PO TABS
125.0000 mg | ORAL_TABLET | Freq: Two times a day (BID) | ORAL | Status: DC
  Filled 2012-08-29: qty 1

## 2012-08-29 MED ORDER — MIDAZOLAM HCL 2 MG/2ML IJ SOLN: 1.0000 mg | INTRAMUSCULAR | Status: DC | PRN

## 2012-08-29 MED ORDER — PROPOFOL 10 MG/ML IV BOLUS
INTRAVENOUS | Status: DC | PRN
  Administered 2012-08-29: 110 mg via INTRAVENOUS

## 2012-08-29 MED ORDER — FENTANYL CITRATE 0.05 MG/ML IJ SOLN: 50.0000 ug | Freq: Once | INTRAMUSCULAR | Status: DC

## 2012-08-29 MED ORDER — ROCURONIUM BROMIDE 100 MG/10ML IV SOLN
INTRAVENOUS | Status: DC | PRN
  Administered 2012-08-29: 35 mg via INTRAVENOUS

## 2012-08-29 MED ORDER — NEOSTIGMINE METHYLSULFATE 1 MG/ML IJ SOLN
INTRAMUSCULAR | Status: DC | PRN
  Administered 2012-08-29: 3 mg via INTRAVENOUS

## 2012-08-29 MED ORDER — FENTANYL CITRATE 0.05 MG/ML IJ SOLN: 25.0000 ug | INTRAMUSCULAR | Status: DC | PRN

## 2012-08-29 MED ORDER — GLYCOPYRROLATE 0.2 MG/ML IJ SOLN
INTRAMUSCULAR | Status: DC | PRN
  Administered 2012-08-29: 0.6 mg via INTRAVENOUS

## 2012-08-29 MED ORDER — ONDANSETRON HCL 4 MG/2ML IJ SOLN
INTRAMUSCULAR | Status: DC | PRN
  Administered 2012-08-29: 4 mg via INTRAVENOUS

## 2012-08-29 MED ORDER — FENTANYL CITRATE 0.05 MG/ML IJ SOLN: 1.0000 ug/kg | INTRAMUSCULAR | Status: DC | PRN

## 2012-08-29 MED ORDER — CIPROFLOXACIN IN D5W 400 MG/200ML IV SOLN
400.0000 mg | Freq: Once | INTRAVENOUS | Status: DC
Start: 2012-08-29 — End: 2012-08-29
  Administered 2012-08-29: 400 mg via INTRAVENOUS
  Filled 2012-08-29: qty 200

## 2012-08-29 MED ORDER — FENTANYL CITRATE 0.05 MG/ML IJ SOLN
INTRAMUSCULAR | Status: DC | PRN
  Administered 2012-08-29: 50 ug via INTRAVENOUS

## 2012-08-29 MED ORDER — METOPROLOL TARTRATE 100 MG PO TABS
100.0000 mg | ORAL_TABLET | Freq: Two times a day (BID) | ORAL | Status: DC
  Administered 2012-08-29 – 2012-08-30 (×2): 100 mg via ORAL
  Filled 2012-08-29 (×3): qty 1

## 2012-08-29 NOTE — Op Note (Signed)
[facility] [address] [place], [address]   ERCP PROCEDURE REPORT  PATIENT: [name] Page, [name] Page  MR# :[id number] BIRTHDATE: [birth year]  GENDER: Male ENDOSCOPIST: Vida Rigger, MD REFERRED BY: PROCEDURE DATE:  08/29/2012 PROCEDURE:   ERCP with sphincterotomy/papillotomy, ERCP with removal of calculus/calculi , and ERCP with balloon dilation ASA CLASS:    3 INDICATIONS: CBD stones MEDICATIONS:    general anesthesia TOPICAL ANESTHETIC:  no  DESCRIPTION OF PROCEDURE:   After the risks benefits and alternatives of the procedure were thoroughly explained, informed consent was obtained.  The Pentax Ercp Scope [id number]  endoscope was introduced through the mouth and advanced to the second portion of the duodenum .and normal ampulla was brought into view and using the triple-lumen sphincterotome loaded with the JAG Jagwire deep selective cannulation was obtained without any pancreatic duct wire advancements or injections . On the initial injection the CBD was slightly dilated in an obvious stone was seenand the cystic duct was patentand we went ahead and proceeded with a moderate size sphincterotomy in the customary fashion until we had adequate biliary drainage and we could get the fully bowed sphincterotome easily in and out of the duct and then we exchanged the sphincterotome per the adjustable 12-15 mm balloon and proceed with multiple 12 mm balloon pull-through and multiple small stones were delivered on multiple balloon pull-throughs there was some resistance in passing the balloon through the patent sphincterotomy site but no residual stones were seen on occlusion cholangiogram but his  Drainage was a little sluggish so we advanced the 4 cm x 10 mm dilation balloon and dilated the sphincter in the customary fashion and than lower the balloon inserted it halfway up the duct inflated it partially it easily pulled through the ampulla and the drainage was  excellent and we elected to stop the procedure at this point and the wire was removed and the patient tolerated the procedure well there was no obvious immediate complications         COMPLICATIONS:  none  ENDOSCOPIC IMPRESSION:1. Normal ampulla 2. No pancreatic duct injections or wires advancement 3. Multiple small CBD stone status post sphincterotomy and balloon pull-through and removal 4 balloon dilation of the ampulla as above 5. Patent cystic duct 6. Otherwise within normal limits  RECOMMENDATIONS:customary post-ERCP orders observe for delayed complications happy to see back when necessary     _______________________________ eSigned:  Vida Rigger, MD 08/29/2012 8:43 AM   CC:

## 2012-08-29 NOTE — Progress Notes (Signed)
Subjective: He underwent ERCP today with no complications. He denies nausea, vomiting, or abdominal pain. He complains of right foot pain in the lateral aspect but explains that it has been present for years with no recent trauma or injury.   Objective: Vital signs in last 24 hours: Filed Vitals:   08/29/12 0927 08/29/12 0930 08/29/12 1021 08/29/12 1456  BP: 132/93  149/95 122/71  Pulse:    93  Temp:  97.2 F (36.2 C)  98.4 F (36.9 C)  TempSrc:    Oral  Resp: 18  20 18   Height:      Weight:      SpO2:   100% 98%   Weight change: -3 lb 1.4 oz (-1.4 kg)  Intake/Output Summary (Last 24 hours) at 08/29/12 1633 Last data filed at 08/29/12 1400  Gross per 24 hour  Intake 2784.5 ml  Output    100 ml  Net 2684.5 ml   Vitals reviewed. General: resting in bed, in NAD HEENT: scleral icterus, jaundiced Cardiac: Regular rate, irregular rhythm, no rubs, murmurs or gallops Pulm: clear to auscultation bilaterally, no wheezes, rales, or rhonchi Abd: soft, nontender, nondistended, BS present Ext: warm and well perfused, no pedal edema, right foot atraumatic with no edema or erythema Neuro: alert and oriented X3, cranial nerves II-XII grossly intact, strength and sensation to light touch equal in bilateral upper and lower extremities  Lab Results: Basic Metabolic Panel:  Recent Labs Lab 08/24/12 2031  08/25/12 0530  08/29/12 0450 08/29/12 1122  NA  --   --  137  < > 130* 132*  K  --   --  3.3*  < > 3.4* 3.4*  CL  --   --  102  < > 100 98  CO2  --   --  25  < > 21 25  GLUCOSE  --   --  112*  < > 92 106*  BUN  --   --  29*  < > 22 22  CREATININE  --   < > 1.83*  < > 1.46* 1.41*  CALCIUM  --   --  8.0*  < > 8.7 9.0  MG 1.6  --  2.5  --   --   --   < > = values in this interval not displayed. Liver Function Tests:  Recent Labs Lab 08/29/12 0450 08/29/12 1122  AST 51* 64*  ALT 53 62*  ALKPHOS 126* 147*  BILITOT 6.0* 7.1*  PROT 6.2 7.1  ALBUMIN 2.8* 3.2*    Recent  Labs Lab 08/24/12 2308  LIPASE 25    CBC:  Recent Labs Lab 08/24/12 1832  08/28/12 0515 08/29/12 0450  WBC 12.0*  < > 6.4 6.3  NEUTROABS 10.2*  --   --   --   HGB 14.4  < > 13.5 12.9*  HCT 39.2  < > 37.8* 36.2*  MCV 97.3  < > 98.4 97.6  PLT 144*  < > 133* 137*  < > = values in this interval not displayed. Cardiac Enzymes:  Recent Labs Lab 08/24/12 1833 08/24/12 2300 08/25/12 0530  TROPONINI <0.30 <0.30 <0.30   CBG:  Recent Labs Lab 08/24/12 2232 08/25/12 0744 08/25/12 1156 08/25/12 1721 08/25/12 2100 08/26/12 0728  GLUCAP 142* 92 116* 104* 102* 90   Hemoglobin A1C:  Recent Labs Lab 08/24/12 2031  HGBA1C 5.2   Fasting Lipid Panel:  Recent Labs Lab 08/24/12 2300  CHOL 142  HDL 48  LDLCALC 84  TRIG 49  CHOLHDL 3.0   Thyroid Function Tests:  Recent Labs Lab 08/24/12 2031 08/26/12 1454  TSH 0.966  --   FREET4  --  1.21   Coagulation:  Recent Labs Lab 08/24/12 1832 08/28/12 0515  LABPROT 13.5 13.9  INR 1.04 1.08   Urine Drug Screen: Drugs of Abuse     Component Value Date/Time   LABOPIA NONE DETECTED 08/24/2012 1915   COCAINSCRNUR NONE DETECTED 08/24/2012 1915   LABBENZ NONE DETECTED 08/24/2012 1915   AMPHETMU NONE DETECTED 08/24/2012 1915   THCU NONE DETECTED 08/24/2012 1915   LABBARB NONE DETECTED 08/24/2012 1915    Alcohol Level:  Recent Labs Lab 08/24/12 1832  ETH <11   Urinalysis:  Recent Labs Lab 08/24/12 1915  COLORURINE YELLOW  LABSPEC 1.015  PHURINE 8.0  GLUCOSEU NEGATIVE  HGBUR NEGATIVE  BILIRUBINUR NEGATIVE  KETONESUR NEGATIVE  PROTEINUR NEGATIVE  UROBILINOGEN 2.0*  NITRITE NEGATIVE  LEUKOCYTESUR TRACE*   Micro Results: Recent Results (from the past 240 hour(s))  URINE CULTURE     Status: None   Collection Time    08/24/12  7:15 PM      Result Value Range Status   Specimen Description URINE, CLEAN CATCH   Final   Special Requests NONE   Final   Culture  Setup Time 08/25/2012 02:40   Final   Colony  Count >=100,000 COLONIES/ML   Final   Culture     Final   Value: STAPHYLOCOCCUS SPECIES (COAGULASE NEGATIVE)     Note: RIFAMPIN AND GENTAMICIN SHOULD NOT BE USED AS SINGLE DRUGS FOR TREATMENT OF STAPH INFECTIONS.   Report Status 08/27/2012 FINAL   Final   Organism ID, Bacteria STAPHYLOCOCCUS SPECIES (COAGULASE NEGATIVE)   Final  CULTURE, BLOOD (ROUTINE X 2)     Status: None   Collection Time    08/24/12  8:40 PM      Result Value Range Status   Specimen Description BLOOD RIGHT ARM   Final   Special Requests BOTTLES DRAWN AEROBIC AND ANAEROBIC 10CC EA   Final   Culture  Setup Time 08/25/2012 14:14   Final   Culture     Final   Value:        BLOOD CULTURE RECEIVED NO GROWTH TO DATE CULTURE WILL BE HELD FOR 5 DAYS BEFORE ISSUING A FINAL NEGATIVE REPORT   Report Status PENDING   Incomplete  CULTURE, BLOOD (ROUTINE X 2)     Status: None   Collection Time    08/24/12  8:45 PM      Result Value Range Status   Specimen Description BLOOD RIGHT HAND   Final   Special Requests BOTTLES DRAWN AEROBIC AND ANAEROBIC 10CC EA   Final   Culture  Setup Time 08/25/2012 14:14   Final   Culture     Final   Value:        BLOOD CULTURE RECEIVED NO GROWTH TO DATE CULTURE WILL BE HELD FOR 5 DAYS BEFORE ISSUING A FINAL NEGATIVE REPORT   Report Status PENDING   Incomplete  MRSA PCR SCREENING     Status: None   Collection Time    08/24/12 10:35 PM      Result Value Range Status   MRSA by PCR NEGATIVE  NEGATIVE Final   Comment:            The GeneXpert MRSA Assay (FDA     approved for NASAL specimens     only), is one component of a  comprehensive MRSA colonization     surveillance program. It is not     intended to diagnose MRSA     infection nor to guide or     monitor treatment for     MRSA infections.   Studies/Results: Dg Ercp Biliary & Pancreatic Ducts  08/29/2012   *RADIOLOGY REPORT*  Clinical Data: ERCP for gallstone removal  ERCP  Comparison: MRCP - 08/27/2012; CT abdomen pelvis -  08/25/2012  Findings:  Four spot intraoperative radiographic images of the right upper quadrant during ERCP are provided for review.  Images demonstrate an ERCP probe overlying the right upper abdominal quadrant.  There is selective cannulation and opacification of the common bile duct which appears non-dilated.  There is a discrete and persistent filling defect within the mid/distal aspect of the common bile duct which likely correlates with the gallstones seen on preprocedural MRCP.  Subsequent images demonstrate insufflation of a sphincterotomy balloon within the more proximal common bile duct.  There is opacification of the cystic duct and gallbladder which contains a large filling defect compatible with the known gallstone.  There is minimal opacification of the central aspect of the intrahepatic biliary tree which appears non dilated.  There is no definite opacification of the pancreatic duct.  IMPRESSION: 1.  ERCP with findings of choledocholithiasis and subsequent balloon sphincterotomy. 2.  Cholelithiasis.  The cystic duct appears patent.   Original Report Authenticated By: Tacey Ruiz, MD   Dg Foot 2 Views Right  08/29/2012   *RADIOLOGY REPORT*  Clinical Data: Evaluate for osteomyelitis or fracture  RIGHT FOOT - 2 VIEW  Comparison: None.  Findings: Negative for fracture.  No cortical erosion or osteomyelitis.  There is calcaneal spurring.  IMPRESSION: No acute abnormality.   Original Report Authenticated By: Janeece Riggers, M.D.   Medications: I have reviewed the patient's current medications. Scheduled Meds: . aspirin EC  81 mg Oral Daily  . carbamide peroxide  5 drop Both Ears BID  . fentaNYL  50-100 mcg Intravenous Once  . imipenem-cilastatin  250 mg Intravenous Q6H  . metoprolol tartrate  100 mg Oral BID  . pantoprazole  40 mg Oral Daily  . sodium chloride  3 mL Intravenous Q12H   Continuous Infusions:  PRN Meds:.fentaNYL, midazolam, ondansetron (ZOFRAN) IV,  ondansetron Assessment/Plan: Atrial fibrillation with rapid ventricular response - Rate controlled this morning. Precipitant for RVR likely UTI and gallbladder disease. He did not tolerate diltiazem drip well with hypotension and is now off this medication. His 2D echo revealed an EF of 55-60% with no wall motion abnormality, mild left atrium enlargement.  -Continue telemetry  -metoprolol 100mg  BID.   Hyperbilirubinemia - He is now s/p ERCP with multiple small CBD stones removed, sphincterotomy and balloon dilation of the ampulla.  -Appreciate GI consult help managing this patient  -CMP daily   UTI - UA with pyuria, urine culture with Staph aureus. Susceptibility shows MRSA sensitive to tetracycline.  -Discontinued Vancomycin  -Start doxycycline 100mg  BID for UTI once he is off imipenem  SIRS (systemic inflammatory response syndrome) - Resolved (afebrile, with WBC wnl, but persistent A.fib with RVR). Urine culture positive for MRSA. Blood cultures with NGTD. He is now s/p ERCP with retrieval of multiple small CBD stones, and balloon dilation of the ampulla.  -Fluids for hypotension prn  -Continue imipenem as pt is allergic to penicillin  -Discontinued Vancomycin for UTI, will start doxycyline 100mg  BID once imipenem is off   GERD - Protonix daily and stable.  CKD (chronic kidney disease), stage III - Creatinine 1.9 on admission, trending down, to 1.4 today. Will monitor closely.   Generalized weakness - He complained of generalized weakness on admission. UTI could be a contributing factor as well as A.fib with RVR. PT/OT recommend 24 hr supervision/SNF. His wife explains that their son is currently taking care of his wife who is in hospice care at home and she is unable to care for Mr. Socorro by herself. She is open to finding options for possible Rehab placement after his discharge.  -Social worker consult for possible placement-Bed available for 6/20 at [facility]  Hearing impairment  - Likely secondary to bilateral cerumen impaction. His wife tells me that he had his ears "cleaned out" by ENT last year and his hearing was "good" then. He does have bilateral cerumen impaction on physical exam.  -Debrox BID bilaterally  -Ear irrigation bilaterally    Dispo: Disposition is deferred at this time, awaiting improvement of current medical problems.  Anticipated discharge in approximately 1 day.   The patient does have a current PCP Mathis Dad, MD) and does need an [facility] hospital follow-up appointment after discharge.  The patient does not have transportation limitations that hinder transportation to clinic appointments.  .Services Needed at time of discharge: Y = Yes, Blank = No PT:   OT:   RN:   Equipment:   Other:     LOS: 5 days   Ky Barban, MD 08/29/2012, 4:33 PM

## 2012-08-29 NOTE — Preoperative (Signed)
Beta Blockers   Reason not to administer Beta Blockers:Not Applicable 

## 2012-08-29 NOTE — Progress Notes (Signed)
Clinical Social Work Department BRIEF PSYCHOSOCIAL ASSESSMENT 08/29/2012  Patient:  PRAISE, DOLECKI     Account Number:  [id number]     Admit date:  08/24/2012  Clinical Social Worker:  Robin Searing  Date/Time:  08/29/2012 02:08 PM  Referred by:  Physician  Date Referred:  08/27/2012 Referred for  SNF Placement   Other Referral:   Interview type:  Patient Other interview type:   Wife at bedside    PSYCHOSOCIAL DATA Living Status:  FAMILY Admitted from facility:   Level of care:   Primary support name:  spouse- Primary support relationship to patient:  FAMILY Degree of support available:   good but wife is 50 and cannot do physical assist    CURRENT CONCERNS Current Concerns  Post-Acute Placement   Other Concerns:    SOCIAL WORK ASSESSMENT / PLAN Wife wants to see about getting her husband into short term SNF at d/c- states he needs to be able to walk independently at home. SHe is interested in the SNF that is near his "shop" and asks that I pursue a SNF bed for him at [facility]-   Assessment/plan status:  Other - See comment Other assessment/ plan:   Information/referral to community resources:   SNF  [facility]  EMS  Insurance    PATIENT'S/FAMILY'S RESPONSE TO PLAN OF CARE: Both are agreeable (patient is hesitant but understands his wife's concerns) to SNF at d/c- will fax out and advise on offers-    Reece Levy, MSW, [employer] [phone]

## 2012-08-29 NOTE — Progress Notes (Signed)
Internal Medicine Teaching Service Attending Note Date: 08/29/2012  Patient name: [name] Page  Medical record number: [id number]  Date of birth: Nov 20, 1924    This morning Mr Schoen appeared generally comfortable but complained pain in throat. He was s/p ERCP with sphincterotomy. Multiple stones were removed from his cystic duct and gall bladder and gb was drained. He also complained about his right foot, which he said was hurting more than usual.  Vitals Filed Vitals:   08/29/12 1456  BP: 122/71  Pulse: 93  Temp: 98.4 F (36.9 C)  Resp: 18   Exam No acute distress Heart: S1S2 Irregular irregular, not tachycardic Lungs: clear Abdomen: soft, non-tender, non-distended.  Ext: bilateral pulses present, no pedal edema.    Recent Labs Lab 08/27/12 0515 08/28/12 0515 08/29/12 0450  HGB 13.1 13.5 12.9*  HCT 36.1* 37.8* 36.2*  WBC 9.4 6.4 6.3  PLT 124* 133* 137*    Recent Labs Lab 08/24/12 1849 08/24/12 2031  08/25/12 0530 08/26/12 0440 08/27/12 0515 08/28/12 0515 08/29/12 0450 08/29/12 1122  NA 140  --   --  137 136 134* 133* 130* 132*  K 3.6  --   --  3.3* 3.8 3.7 3.3* 3.4* 3.4*  CL 104  --   --  102 105 102 102 100 98  CO2  --   --   --  25 20 23 23 21 25   GLUCOSE 121*  --   --  112* 96 110* 99 92 106*  BUN 26*  --   --  29* 24* 19 19 22 22   CREATININE 1.90*  --   < > 1.83* 1.63* 1.46* 1.42* 1.46* 1.41*  CALCIUM  --   --   --  8.0* 8.3* 8.7 8.9 8.7 9.0  MG  --  1.6  --  2.5  --   --   --   --   --   < > = values in this interval not displayed.   Recent Labs Lab 08/24/12 1832  08/26/12 0440 08/27/12 0515 08/28/12 0515 08/29/12 0450 08/29/12 1122  AST 109*  < > 55* 63* 55* 51* 64*  ALT 109*  < > 65* 65* 59* 53 62*  ALKPHOS 161*  < > 117 123* 133* 126* 147*  BILITOT 3.2*  < > 5.2* 6.3* 7.3* 6.0* 7.1*  PROT 7.1  < > 6.4 6.4 6.5 6.2 7.1  ALBUMIN 3.5  < > 3.1* 3.0* 3.0* 2.8* 3.2*  INR 1.04  --   --   --  1.08  --   --   < > = values in this interval  not displayed.   Scheduled Meds . aspirin EC  81 mg Oral Daily  . carbamide peroxide  5 drop Both Ears BID  . fentaNYL  50-100 mcg Intravenous Once  . imipenem-cilastatin  250 mg Intravenous Q6H  . metoprolol tartrate  100 mg Oral BID  . pantoprazole  40 mg Oral Daily  . sodium chloride  3 mL Intravenous Q12H    Assessment and Plan   Cholecystitis with choledocholithiasis s/p ERCP- He has not spiked any fever, his WBC count has been within normal ranges. I would stop his IV antibiotics and begin oral ciprofloxacin.We will follow liver function tests. Would follow GI recs.  Atrial fibrillation - He is on metoprolol 100 mg BID with HR between 73-119 today. Continue to monitor. He has a CHADS2 vasc score of 5, making his stroke risk 6.7% per year. However, he has history  of upper GI bleed in 2008. He is not on coumadin and from previous notes in 2012, I concur that he is opposed to both coumadin and aspirin. His home meds do not contain aspirin.    Hypokalemia - monitor electrolyte and replete.   Staph (coag negative) UTI in absence of a catheter - The blood cultures have been negative so far. This could be asymptomatic bacteruria or contamination. We were giving antibiotics in the setting of SIRS. However, the patient has no urinary symptoms, no abdominal pain, so I would just stop the antibiotics at this time.          Aletta Edouard 08/29/2012, 5:53 PM.

## 2012-08-29 NOTE — Progress Notes (Signed)
Irrigated pt's left ear with 20cc's NS. No signs of any earwax dislodged or removed. Irrigated pt's right ear with 20cc's NS. Also no signs of any earwax dislodged. Using flashlight earwax is noted deep in pt's ear. Cleaned outer ear with rag/q-tip.    Delynn Flavin, RN.

## 2012-08-29 NOTE — Progress Notes (Signed)
SNF bed confirmed for patient for tomorrow at [facility]- FL2 to be placed in chart for MD signature- Reece Levy, MSW, [employer] [phone]

## 2012-08-29 NOTE — Progress Notes (Signed)
[name] Page 7:25 AM  Subjective: Patient without any complaint and his hospital chart computer was reviewed and case discussed with my partner Dr. Bosie Clos  Objective: Vital signs stable afebrile no acute distress exam please see pre-assessment evaluation labs and x-rays reviewed  Assessment: CBD stones  Plan: I rediscussed ERCP risks benefits methods and success rate with the patient and okay to proceed this morning with further workup and plans pending those findings  [facility]

## 2012-08-29 NOTE — Anesthesia Procedure Notes (Signed)
Procedure Name: Intubation Date/Time: 08/29/2012 7:46 AM Performed by: Gwenyth Allegra Pre-anesthesia Checklist: Patient identified, Timeout performed, Emergency Drugs available, Suction available and Patient being monitored Patient Re-evaluated:Patient Re-evaluated prior to inductionOxygen Delivery Method: Circle system utilized Preoxygenation: Pre-oxygenation with 100% oxygen Intubation Type: IV induction Ventilation: Mask ventilation without difficulty and Oral airway inserted - appropriate to patient size Laryngoscope Size: Mac and 4 Tube type: Oral Number of attempts: 1 Placement Confirmation: ETT inserted through vocal cords under direct vision and positive ETCO2 Secured at: 22 cm Tube secured with: Tape Dental Injury: Teeth and Oropharynx as per pre-operative assessment

## 2012-08-29 NOTE — Anesthesia Preprocedure Evaluation (Signed)
Anesthesia Evaluation  Patient identified by MRN, date of birth, ID band Patient awake    Reviewed: Allergy & Precautions, H&P , NPO status , Patient's Chart, lab work & pertinent test results  Airway Mallampati: I TM Distance: >3 FB Neck ROM: Full    Dental   Pulmonary          Cardiovascular hypertension, + dysrhythmias Atrial Fibrillation Rhythm:Irregular Rate:Normal     Neuro/Psych CVA    GI/Hepatic GERD-  ,  Endo/Other    Renal/GU Renal InsufficiencyRenal disease     Musculoskeletal   Abdominal   Peds  Hematology   Anesthesia Other Findings   Reproductive/Obstetrics                           Anesthesia Physical Anesthesia Plan  ASA: III  Anesthesia Plan: General   Post-op Pain Management:    Induction: Intravenous  Airway Management Planned: Oral ETT  Additional Equipment:   Intra-op Plan:   Post-operative Plan: Extubation in OR  Informed Consent: I have reviewed the patients History and Physical, chart, labs and discussed the procedure including the risks, benefits and alternatives for the proposed anesthesia with the patient or authorized representative who has indicated his/her understanding and acceptance.     Plan Discussed with: CRNA and Surgeon  Anesthesia Plan Comments:         Anesthesia Quick Evaluation

## 2012-08-29 NOTE — Anesthesia Postprocedure Evaluation (Signed)
  Anesthesia Post-op Note  Patient: [name] Page  Procedure(s) Performed: Procedure(s): ENDOSCOPIC RETROGRADE CHOLANGIOPANCREATOGRAPHY (ERCP) (N/A)  Patient Location: PACU  Anesthesia Type:General  Level of Consciousness: awake  Airway and Oxygen Therapy: Patient Spontanous Breathing  Post-op Pain: mild  Post-op Assessment: Post-op Vital signs reviewed, Patient's Cardiovascular Status Stable, Respiratory Function Stable, Patent Airway, No signs of Nausea or vomiting and Pain level controlled  Post-op Vital Signs: stable  Complications: No apparent anesthesia complications

## 2012-08-29 NOTE — Progress Notes (Signed)
Clinical Social Work Department CLINICAL SOCIAL WORK PLACEMENT NOTE 08/29/2012  Patient:  [name] Page, [name] Page  Account Number:  [id number] Admit date:  08/24/2012  Clinical Social Worker:  Robin Searing  Date/time:  08/29/2012 02:16 PM  Clinical Social Work is seeking post-discharge placement for this patient at the following level of care:   SKILLED NURSING   (*CSW will update this form in Epic as items are completed)   08/29/2012  Patient/family provided with [facility] Department of Clinical Social Work's list of facilities offering this level of care within the geographic area requested by the patient (or if unable, by the patient's family).  08/29/2012  Patient/family informed of their freedom to choose among providers that offer the needed level of care, that participate in Medicare, Medicaid or managed care program needed by the patient, have an available bed and are willing to accept the patient.  08/29/2012  Patient/family informed of [facility]' ownership interest in [facility], as well as of the fact that they are under no obligation to receive care at this facility.  PASARR submitted to EDS on 08/29/2012 PASARR number received from [employer] on 08/29/2012  FL2 transmitted to all facilities in geographic area requested by pt/family on  08/29/2012 FL2 transmitted to all facilities within larger geographic area on   Patient informed that his/her managed care company has contracts with or will negotiate with  certain facilities, including the following:     Patient/family informed of bed offers received:   Patient chooses bed at  Physician recommends and patient chooses bed at    Patient to be transferred to  on   Patient to be transferred to facility by   The following physician request were entered in Epic:   Additional Comments: Reece Levy, MSW, [facility] [phone]

## 2012-08-29 NOTE — Transfer of Care (Signed)
Immediate Anesthesia Transfer of Care Note  Patient: [name] Page  Procedure(s) Performed: Procedure(s): ENDOSCOPIC RETROGRADE CHOLANGIOPANCREATOGRAPHY (ERCP) (N/A)  Patient Location: PACU  Anesthesia Type:General  Level of Consciousness: awake and alert   Airway & Oxygen Therapy: Patient Spontanous Breathing and Patient connected to nasal cannula oxygen  Post-op Assessment: Report given to PACU RN and Post -op Vital signs reviewed and stable  Post vital signs: Reviewed and stable  Complications: No apparent anesthesia complications

## 2012-08-30 DIAGNOSIS — H9190 Unspecified hearing loss, unspecified ear: Secondary | ICD-10-CM | POA: Diagnosis present

## 2012-08-30 DIAGNOSIS — N390 Urinary tract infection, site not specified: Secondary | ICD-10-CM | POA: Diagnosis present

## 2012-08-30 DIAGNOSIS — K8043 Calculus of bile duct with acute cholecystitis with obstruction: Secondary | ICD-10-CM | POA: Diagnosis present

## 2012-08-30 LAB — CBC WITH DIFFERENTIAL/PLATELET
Basophils Absolute: 0 10*3/uL (ref 0.0–0.1)
Hemoglobin: 13.2 g/dL (ref 13.0–17.0)
Lymphocytes Relative: 15 % (ref 12–46)
MCH: 34.9 pg — ABNORMAL HIGH (ref 26.0–34.0)
MCHC: 36.4 g/dL — ABNORMAL HIGH (ref 30.0–36.0)
MCV: 96 fL (ref 78.0–100.0)
Monocytes Absolute: 0.8 10*3/uL (ref 0.1–1.0)
Neutro Abs: 4.4 10*3/uL (ref 1.7–7.7)
Neutrophils Relative %: 66 % (ref 43–77)
RBC: 3.78 MIL/uL — ABNORMAL LOW (ref 4.22–5.81)

## 2012-08-30 LAB — COMPREHENSIVE METABOLIC PANEL
ALT: 53 U/L (ref 0–53)
AST: 54 U/L — ABNORMAL HIGH (ref 0–37)
Albumin: 2.9 g/dL — ABNORMAL LOW (ref 3.5–5.2)
CO2: 25 mEq/L (ref 19–32)
Calcium: 9.3 mg/dL (ref 8.4–10.5)
Chloride: 100 mEq/L (ref 96–112)
Creatinine, Ser: 1.48 mg/dL — ABNORMAL HIGH (ref 0.50–1.35)
GFR calc non Af Amer: 41 mL/min — ABNORMAL LOW (ref >90)
Sodium: 134 mEq/L — ABNORMAL LOW (ref 135–145)
Total Bilirubin: 5.8 mg/dL — ABNORMAL HIGH (ref 0.3–1.2)

## 2012-08-30 MED ORDER — CIPROFLOXACIN HCL 500 MG PO TABS
500.0000 mg | ORAL_TABLET | Freq: Two times a day (BID) | ORAL | Status: DC
  Filled 2012-08-30 (×2): qty 1

## 2012-08-30 MED ORDER — CIPROFLOXACIN HCL 500 MG PO TABS: 500.0000 mg | ORAL_TABLET | Freq: Two times a day (BID) | ORAL | Status: DC

## 2012-08-30 MED ORDER — METOPROLOL TARTRATE 100 MG PO TABS: 100.0000 mg | ORAL_TABLET | Freq: Two times a day (BID) | ORAL | Status: DC

## 2012-08-30 MED ORDER — CARBAMIDE PEROXIDE 6.5 % OT SOLN: 5.0000 [drp] | Freq: Two times a day (BID) | OTIC | Status: DC

## 2012-08-30 MED ORDER — DOXYCYCLINE HYCLATE 100 MG PO TABS
100.0000 mg | ORAL_TABLET | Freq: Two times a day (BID) | ORAL | Status: DC
  Administered 2012-08-30: 100 mg via ORAL
  Filled 2012-08-30 (×2): qty 1

## 2012-08-30 NOTE — Progress Notes (Signed)
Physical Therapy Treatment Patient Details Name: [name] Page MRN: [id number] DOB: [birth year] Today's Date: 08/30/2012 Time: 8413-2440 PT Time Calculation (min): 24 min  PT Assessment / Plan / Recommendation Comments on Treatment Session  Pt admitted with UTI, SIRS, AFib s/p ERCP and progressing with mobility with HR 89-128 with activity today with majority of rate around 105. Decreased pain and foot and no LOB standing to urinate today. Pt with improved balance but continues to require minguard for safety and stability. Will continue to follow and recommend ST-SNF.    Follow Up Recommendations        Does the patient have the potential to tolerate intense rehabilitation     Barriers to Discharge        Equipment Recommendations       Recommendations for Other Services    Frequency     Plan Discharge plan remains appropriate;Frequency remains appropriate    Precautions / Restrictions Precautions Precautions: Fall   Pertinent Vitals/Pain 3/10 right foot pain HR stable sats 92% on RA    Mobility  Bed Mobility Supine to Sit: 6: Modified independent (Device/Increase time) Sitting - Scoot to Edge of Bed: 6: Modified independent (Device/Increase time);With rail Transfers Sit to Stand: 4: Min guard;From bed Stand to Sit: 4: Min guard;To chair/3-in-1 Details for Transfer Assistance: cueing for hand placement and safety Ambulation/Gait Ambulation/Gait Assistance: 4: Min assist Ambulation Distance (Feet): 150 Feet Assistive device: Rolling walker Ambulation/Gait Assistance Details: pt continues to keep right heel up but not as much as prior visit, shuffle Gait Pattern: Shuffle;Trunk flexed;Decreased dorsiflexion - right Gait velocity: decreased Stairs: No    Exercises General Exercises - Lower Extremity Ankle Circles/Pumps: AROM;20 reps;Both;Seated Long Arc Quad: AROM;20 reps;Both Hip ABduction/ADduction: AROM;20 reps;Both;Seated Hip Flexion/Marching: AROM;20  reps;Both;Seated   PT Diagnosis:    PT Problem List:   PT Treatment Interventions:     PT Goals Acute Rehab PT Goals PT Goal: Sit to Stand - Progress: Progressing toward goal PT Goal: Stand to Sit - Progress: Progressing toward goal PT Goal: Ambulate - Progress: Progressing toward goal  Visit Information  Last PT Received On: 08/30/12 Assistance Needed: +1    Subjective Data  Subjective: I want to get my razor out of there   Cognition  Cognition Arousal/Alertness: Awake/alert Behavior During Therapy: WFL for tasks assessed/performed Area of Impairment: Safety/judgement Safety/Judgement: Decreased awareness of safety    Balance     End of Session PT - End of Session Equipment Utilized During Treatment: Gait belt Activity Tolerance: Patient tolerated treatment well Patient left: in chair;with chair alarm set;with call bell/phone within reach Nurse Communication: Mobility status   GP     Delorse Lek 08/30/2012, 9:50 AM Delaney Meigs, PT [phone]

## 2012-08-30 NOTE — Care Management Note (Signed)
    Page 1 of 1   08/30/2012     4:41:25 PM   CARE MANAGEMENT NOTE 08/30/2012  Patient:  [name] Page, [name] Page   Account Number:  [id number]  Date Initiated:  08/26/2012  Documentation initiated by:  Verdis Prime  Subjective/Objective Assessment:   79 yr-old male adm with dx of SIRS and AFib with RVR; lives with spouse, uses walker for ambulation     Action/Plan:   PT/OT RECOMMENDING ST-SNF FOR REHAB.  WILL CONSULT CSW TO FACILITATE DC WHEN MEDICALLY STABLE.   Anticipated DC Date:  08/30/2012   Anticipated DC Plan:  SKILLED NURSING FACILITY  In-house referral  Clinical Social Worker      DC Planning Services  CM consult      Choice offered to / List presented to:             Status of service:  Completed, signed off Medicare Important Message given?   (If response is "NO", the following Medicare IM given date fields will be blank) Date Medicare IM given:   Date Additional Medicare IM given:    Discharge Disposition:  HOME/SELF CARE  Per UR Regulation:  Reviewed for med. necessity/level of care/duration of stay  If discussed at Long Length of Stay Meetings, dates discussed:    Comments:  PCP: Dr Rosana Berger  08/30/12 Berania Peedin,RN,BSN [phone] PT DISCHARGED TO SNF TODAY, PER CSW ARRANGEMENTS.

## 2012-08-30 NOTE — Discharge Summary (Signed)
Name: [name] Page MRN: [id number] DOB: 24-May-1924 79 y.o. PCP: Mathis Dad, MD  Date of Admission: 08/24/2012  6:17 PM Date of Discharge: 08/30/2012 Attending Physician: Aletta Edouard, MD  Discharge Diagnosis: Principal Problem:   SIRS (systemic inflammatory response syndrome)- Resolved Active Problems:   ESSENTIAL HYPERTENSION - Stable   Atrial fibrillation - Rate controlled   GERD- Stable   CKD (chronic kidney disease), stage III - Stable   Slurred speech - Chronic   Atrial fibrillation with rapid ventricular response - Resolved   UTI - Treated, asymptomatic   Hearing Impairment - Chronic   Choledocholithiasis with acute cholecystitis with obstruction- Resolved, s/p ERCP   Discharge Medications:   Medication List    STOP taking these medications       amLODipine 5 MG tablet  Commonly known as:  NORVASC     traMADol 50 MG tablet  Commonly known as:  ULTRAM     valsartan-hydrochlorothiazide 160-25 MG per tablet  Commonly known as:  DIOVAN-HCT      TAKE these medications       carbamide peroxide 6.5 % otic solution  Commonly known as:  DEBROX  Place 5 drops into both ears 2 (two) times daily.     ciprofloxacin 500 MG tablet  Commonly known as:  CIPRO  Take 1 tablet (500 mg total) by mouth 2 (two) times daily.     metoprolol 100 MG tablet  Commonly known as:  LOPRESSOR  Take 1 tablet (100 mg total) by mouth 2 (two) times daily.     omeprazole 40 MG capsule  Commonly known as:  PRILOSEC  Take 1 capsule (40 mg total) by mouth daily.        Disposition and follow-up:   [name] Page was discharged from [facility] in Stable condition.  At the hospital follow up visit please address:  1.  Reevaluate A. Fib and rate control, reassess his BP while on metoprolol and consider resuming amlodipine and Valsartan-HCTZ as clinically indicated. He may need referral to ENT for bilateral cerumen disimpaction.   2.  Labs / imaging needed  at time of follow-up: CMP  3.  Pending labs/ test needing follow-up: None  Follow-up Appointments: Follow-up Information   Follow up with Janalyn Harder, MD On 09/06/2012. (9:25 AM)    Contact information:   [address] [place] [address] [phone]       Discharge Instructions:  Future Appointments Provider Department Dept Phone   09/06/2012 9:45 AM Linward Headland, MD [facility] [phone]      Consultations: Treatment Team:  Shirley Friar, MD  Procedures Performed:  Ct Abdomen Pelvis Wo Contrast  08/25/2012   *RADIOLOGY REPORT*  Clinical Data: Painless jaundice  CT ABDOMEN AND PELVIS WITHOUT CONTRAST  Technique:  Multidetector CT imaging of the abdomen and pelvis was performed following the standard protocol without intravenous contrast.  Comparison: Ultrasound abdomen 08/25/2012.  CT chest 06/04/2006.  Findings: The lung bases:  There is some respiratory motion. Linear scarring or atelectasis is seen adjacent to the major fissure on the right.  There is dependent atelectasis in both lower lobes and focal atelectasis or scarring laterally in the left lower lobe.  Coronary artery atherosclerotic calcifications are present. Aortic valve annular calcifications are noted.  Heart size appears within normal limits. Trace right pleural effusion.  Innumerable tiny gallstones are seen filling approximately half the volume of the gallbladder.  These were  seen on today's ultrasound. Gallbladder wall thickening is visible by CT along the anterior aspect of the gallbladder, and most accurately measured on ultrasound (see today's abdominal ultrasound). It is noted that this gallbladder wall thickening appears unchanged compared to a chest CT of March 2008.  No pericholecystic fluid collection or definite stranding is identified.  The liver is normal in size.  No intrahepatic biliary ductal dilatation is appreciated.  On image number 37 there is rounded high  density measuring 6 mm within the expected location of the common bile duct, proximal to the pancreatic head.  Please note that the common bile duct course is somewhat difficult to follow, as it is not dilated.  This density is seen on coronal image number 34.  On image #45 of the axial images is a dense calcification measuring 4 mm that is in the expected location of the distal common bile duct, near the ampulla, at junction with the second portion of the duodenum.  See coronal image number 37.  The pancreas demonstrates slightly slight mild fatty atrophy.  No pancreatic mass or ductal dilatation is seen.  There are no pancreatic calcifications or fluid collections.  No peripancreatic inflammatory change.  The spleen and adrenal glands are within normal limits.  Both kidneys demonstrate some cortical thinning and nonspecific perinephric stranding.  There is no hydronephrosis or evidence of mass on noncontrast CT.  Negative for renal stones.  There are some vascular calcifications in the branches of the renal arteries. Both ureters are normal in caliber.  No ureteral stone identified. Urinary bladder normal.  Prostate gland mildly prominent.  Small bowel loops are normal in caliber.  Terminal ileum normal. Appendix not definitely visualized.  No pericecal inflammatory changes.  There are diverticular changes of the descending colon and sigmoid colon, without evidence of inflammation.  There is atherosclerotic calcification of the abdominal aorta and iliac vasculature, without aneurysm.  There is atherosclerotic calcification scattered in the superior mesenteric artery and the proximal renal arteries.  Negative for ascites or lymphadenopathy.  There are multilevel degenerative changes of the spine.  No acute or suspicious bony abnormality.  IMPRESSION:  1.  Findings suspicious for choledocholithiasis.  Two separate rounded high density structures are seen within the expected location of the common bile duct, one just  proximal to the pancreatic head, and one near the ampulla.  These densities measure 6 and 4 mm, respectively.  No defininite associated biliary ductal dilatation is identified. 2.  Cholelithiasis.  Mild gallbladder wall thickening is present, as described on today's abdominal ultrasound.  The gallbladder wall thickening may be chronic, as it appears unchanged dating back to chest CT of 2008.  Acute cholecystitis cannot be excluded by imaging, but is certainly not definite.  There are no surrounding inflammatory changes.  Suggest clinical correlation. 3.  Coronary artery and abdominal and pelvic atherosclerosis. 4.  Colonic diverticulosis without acute inflammatory changes. 5.  Mild prostamegaly. 6.  Pancreas appears within normal limits. 7.  Trace right pleural effusion   Original Report Authenticated By: Britta Mccreedy, M.D.   Dg Chest 2 View  08/25/2012   *RADIOLOGY REPORT*  Clinical Data: Fever.  CHEST - 2 VIEW  Comparison: 06/04/2006.  Findings: Borderline enlarged cardiac silhouette.  Clear lungs. Thoracic spine degenerative changes, including changes of DISH. Old, healed left sixth rib fracture.  IMPRESSION: No acute abnormality.   Original Report Authenticated By: Beckie Salts, M.D.   Ct Head Wo Contrast  08/24/2012   *RADIOLOGY REPORT*  Clinical Data:  Altered mental status and ataxia.  CT HEAD WITHOUT CONTRAST  Technique:  Contiguous axial images were obtained from the base of the skull through the vertex without contrast.  Comparison: 04/22/2005.  Findings: Mildly progressive diffuse enlargement of the ventricles and subarachnoid spaces.  Mildly progressive patchy white matter low density in both cerebral hemispheres.  No intracranial hemorrhage, mass lesion or CT evidence of acute infarction. Unremarkable bones and included paranasal sinuses.  Bilateral vertebral and internal carotid artery atheromatous calcifications.  IMPRESSION:  1.  No acute abnormality. 2.  Mildly progressive atrophy and chronic  small vessel white matter ischemic changes.  These results were called by telephone on 08/24/2012 at 1839 hours to Dr. Roseanne Reno, who verbally acknowledged these results.   Original Report Authenticated By: Beckie Salts, M.D.   US Abdomen Complete  08/25/2012   *RADIOLOGY REPORT*  Clinical Data:  Fever, jaundice, elevated white blood cell count  COMPLETE ABDOMINAL ULTRASOUND  Comparison:  None.  Findings:  Gallbladder:  Multiple small stones layering in the dependent aspect of the gallbladder.  Mild wall thickening up to 4.3 mm. Sonographer reports no sonographic Murphy's sign.  No pericholecystic fluid.  Common bile duct:  3.7 mm diameter, unremarkable.  Liver:  No focal lesion identified.  Within normal limits in parenchymal echogenicity. No definite intrahepatic biliary ductal dilatation.  IVC:  Appears normal.  Pancreas:  Negative, portions obscured by overlying bowel gas.  Spleen:  4.6 cm craniocaudal length, unremarkable.  Right Kidney:  9.3 cm. No hydronephrosis.  Well-preserved cortex. Normal size and parenchymal echotexture without focal abnormalities.  Left Kidney:  9.6 cm. No hydronephrosis.  Well-preserved cortex. Normal size and parenchymal echotexture without focal abnormalities.  Abdominal aorta:  No aneurysm identified.  IMPRESSION:  1.  Cholelithiasis and mild gallbladder wall thickening.   Original Report Authenticated By: D. Andria Rhein, MD   [name] Page  08/27/2012   *RADIOLOGY REPORT*  Clinical Data:  Jaundice  MRI ABDOMEN WITHOUT  CONTRAST (INCLUDING Page)  Technique:  Multiplanar multisequence [name] imaging of the abdomen was performed both without the administration of intravenous contrast. Heavily T2-weighted images of the biliary and pancreatic ducts were obtained, and three-dimensional Page images were rendered by post processing.  Comparison:  08/25/2012  Findings:  There are bilateral pleural effusions present, left greater than right.  The gallbladder is distended and there is mild  gallbladder wall edema which measures up to 8 mm in thickness. Multiple tiny stones are identified throughout the lumen of the gallbladder.  No intrahepatic bile duct dilatation.  The common bile duct has a normal caliber.  Small filling defects within the distal portion of the common bile duct are noted consistent with choledocholithiasis.  The largest stone measures 4 mm.  No pancreatic ductal dilatation.  Normal appearance of the pancreas.  The spleen is normal.  The adrenal glands are unremarkable.  The right kidney is normal. The left kidney is normal  No free fluid identified within the abdomen.  No focal fluid collections identified.  No upper abdominal adenopathy.  IMPRESSION:  1.  Gallstones and choledocholithiasis. 2.  Thickening of the gallbladder wall which may be indicative of cholecystitis.   Original Report Authenticated By: Signa Kell, M.D.   Dg Ercp Biliary & Pancreatic Ducts  08/29/2012   *RADIOLOGY REPORT*  Clinical Data: ERCP for gallstone removal  ERCP  Comparison: Page - 08/27/2012; CT abdomen pelvis - 08/25/2012  Findings:  Four spot intraoperative radiographic images of the right upper quadrant during ERCP are provided for  review.  Images demonstrate an ERCP probe overlying the right upper abdominal quadrant.  There is selective cannulation and opacification of the common bile duct which appears non-dilated.  There is a discrete and persistent filling defect within the mid/distal aspect of the common bile duct which likely correlates with the gallstones seen on preprocedural Page.  Subsequent images demonstrate insufflation of a sphincterotomy balloon within the more proximal common bile duct.  There is opacification of the cystic duct and gallbladder which contains a large filling defect compatible with the known gallstone.  There is minimal opacification of the central aspect of the intrahepatic biliary tree which appears non dilated.  There is no definite opacification of the  pancreatic duct.  IMPRESSION: 1.  ERCP with findings of choledocholithiasis and subsequent balloon sphincterotomy. 2.  Cholelithiasis.  The cystic duct appears patent.   Original Report Authenticated By: Tacey Ruiz, MD   Dg Foot 2 Views Right  08/29/2012   *RADIOLOGY REPORT*  Clinical Data: Evaluate for osteomyelitis or fracture  RIGHT FOOT - 2 VIEW  Comparison: None.  Findings: Negative for fracture.  No cortical erosion or osteomyelitis.  There is calcaneal spurring.  IMPRESSION: No acute abnormality.   Original Report Authenticated By: Janeece Riggers, M.D.    2D Echo:  08/27/12 Study Conclusions  - Left ventricle: The cavity size was normal. Systolic function was normal. The estimated ejection fraction was in the range of 55% to 60%. Wall motion was normal; there were no regional wall motion abnormalities. - Left atrium: The atrium was mildly dilated. - Atrial septum: No defect or patent foramen ovale was identified. Impressions:  - Atrial fibrillation throughout the study.  Admission HPI: 79 y.o PMH atrial fibrillation, HTN, subdural hematoma, stroke, upper GIB secondary to NSAIDS, history of alcohol abuse, enterococcus UTI, chronic kidney disease. He presented with generalized weakness, slurred speech, and neck pain. Patient was a home with his wife today when she stated around 1 PM he complained of his stomach hurting. She states the left upper quadrant was hurting and the patient states bilateral upper quadrants were hurting and he states it felt like "rib pain". She gave him Ultram 50 mg x 1 then the patient lied down to rest. Wife states around 4 PM the patient had to use the rest room but he was unable to sit or stand up out of the lying position to get to the restroom. She called a neighbor who is a Patent examiner and who told her to call 911. Around 5 PM on day of admission the patients wife states she noticed the patient was having slurred speech which was resolved on evaluation  in the ED by Internal Medicine. Review of systems positive for generalized weakness, chills (today), neck pain (non radiating), hard of hearing, difficulty urinating earlier (on day of admission), chronic right knee/leg pain. At baseline the patient walks with a walker and the wife cares for him at home which she states is becoming harder for her to do.    Hospital Course by problem list: Principal Problem:   SIRS (systemic inflammatory response syndrome) Active Problems:   ESSENTIAL HYPERTENSION   Atrial fibrillation   GERD   CKD (chronic kidney disease), stage III   Slurred speech   Atrial fibrillation with rapid ventricular response   UTI (urinary tract infection)   Hearing impairment   Choledocholithiasis with acute cholecystitis with obstruction   Atrial fibrillation with rapid ventricular response - He has chronic A. Fib and has decided  not to be anticoagulated due to history of GI bleed. He had A.fib with RVR upon his admission with rate as high as 150. Troponin was negative, EKG with no ST changes. Precipitant for RVR likely UTI and gallbladder disease. His 2D echo revealed an EF of 55-60% with no wall motion abnormality, mild left atrium enlargement. He did not tolerate diltiazem drip well with hypotension and is now off this medication. His rate was control with gradual titration of metoprolol. His rate remains controlled prior to his discharge with metoprolol 100mg  BID with stable BP. He will follow up with his PCP for reassessment of his A.fib and blood pressure.   Hypertension - He was on home regimen of amlodipine and Valsartan-HCTZ at home, however, these medications were held as metoprolol was titrated up for A. Fib rate control. He will follow up at with his PCP for evaluation of his BP.    Choledocholithiasis with acute cholecystitis with obstruction- His LFTs gradually trended up with CT abdomen, Abdominal US, and Page concerning for choledocholithiasis with acute  cholecystitis and obstruction. Imipenem and Vancomycin treatment were started as part of the treatment for cholecystitis. Vancomycin was eventually discontinued and he remained on imipenem until just prior to his discharge. He is now status post ERCP with multiple small CBD stones removed, sphincterotomy and balloon dilation of the ampulla. His total bilirubin is trending down post ERCP. He has no abdominal pain and is tolerating intake per mouth well prior to his discharge.    UTI - He had UA with pyuria, urine culture with Staph aureus. Susceptibility showed MRSA sensitive to tetracycline, likely covered by imipenem. He is asymptomatic at discharge.   SIRS (systemic inflammatory response syndrome) - Resolved. He was febrile on admission with elevated WBC, and with tachycardia. His urine culture was positive for MRSA. Blood cultures with NGTD. He is now s/p ERCP with retrieval of multiple small CBD stones, and balloon dilation of the ampulla. He remains hemodynamically stable prior to his discharge after treatment of possible sources of infection.   GERD - Protonix daily and stable.   CKD (chronic kidney disease), stage III - Stable. Creatinine 1.9 on admission, trended down, to 1.4.    Generalized weakness - He complained of generalized weakness on admission. UTI could be a contributing factor as well as A.fib with RVR. PT/OT recommend 24 hr supervision/SNF. His wife explains that their son is currently taking care of his wife who is in hospice care at home and she is unable to care for [name]. Hartig by herself. He will be discharged to Rehab facility. Bed available for 6/20 at [facility].  Hearing impairment - Likely secondary to bilateral cerumen impaction. His wife tells me that he had his ears "cleaned out" by ENT last year and his hearing was "good" then. He does have bilateral cerumen impaction on physical exam. He had minimum earwax removed today, recommend continuation of debrox drops upon his  discharge.    Discharge Vitals:   BP 130/87  Pulse 94  Temp(Src) 98.6 F (37 C) (Axillary)  Resp 18  Ht 6\' 3"  (1.905 m)  Wt 165 lb 5.5 oz (75 kg)  BMI 20.67 kg/m2  SpO2 97%  Discharge Labs:  Results for orders placed during the hospital encounter of 08/24/12 (from the past 24 hour(s))  COMPREHENSIVE METABOLIC PANEL     Status: Abnormal   Collection Time    08/30/12  6:25 AM      Result Value Range   Sodium 134 (*)  135 - 145 mEq/L   Potassium 3.6  3.5 - 5.1 mEq/L   Chloride 100  96 - 112 mEq/L   CO2 25  19 - 32 mEq/L   Glucose, Bld 94  70 - 99 mg/dL   BUN 20  6 - 23 mg/dL   Creatinine, Ser 1.61 (*) 0.50 - 1.35 mg/dL   Calcium 9.3  8.4 - 09.6 mg/dL   Total Protein 6.3  6.0 - 8.3 g/dL   Albumin 2.9 (*) 3.5 - 5.2 g/dL   AST 54 (*) 0 - 37 U/L   ALT 53  0 - 53 U/L   Alkaline Phosphatase 135 (*) 39 - 117 U/L   Total Bilirubin 5.8 (*) 0.3 - 1.2 mg/dL   GFR calc non Af Amer 41 (*) >90 mL/min   GFR calc Af Amer 48 (*) >90 mL/min  CBC WITH DIFFERENTIAL     Status: Abnormal   Collection Time    08/30/12  6:25 AM      Result Value Range   WBC 6.7  4.0 - 10.5 K/uL   RBC 3.78 (*) 4.22 - 5.81 MIL/uL   Hemoglobin 13.2  13.0 - 17.0 g/dL   HCT 04.5 (*) 40.9 - 81.1 %   MCV 96.0  78.0 - 100.0 fL   MCH 34.9 (*) 26.0 - 34.0 pg   MCHC 36.4 (*) 30.0 - 36.0 g/dL   RDW 91.4  78.2 - 95.6 %   Platelets 158  150 - 400 K/uL   Neutrophils Relative % 66  43 - 77 %   Neutro Abs 4.4  1.7 - 7.7 K/uL   Lymphocytes Relative 15  12 - 46 %   Lymphs Abs 1.0  0.7 - 4.0 K/uL   Monocytes Relative 12  3 - 12 %   Monocytes Absolute 0.8  0.1 - 1.0 K/uL   Eosinophils Relative 7 (*) 0 - 5 %   Eosinophils Absolute 0.4  0.0 - 0.7 K/uL   Basophils Relative 0  0 - 1 %   Basophils Absolute 0.0  0.0 - 0.1 K/uL    Signed: Ky Barban, MD 08/30/2012, 1:48 PM   Time Spent on Discharge: 45 minutes Services Ordered on Discharge: None, to have PT/OT at Rehab facility Equipment Ordered on Discharge:  None

## 2012-08-30 NOTE — Progress Notes (Signed)
Patient for d/c today to SNF bed at  [facility]. Wife and patient agreeable to this plan- plan transfer via EMS. Reece Levy, MSW, Theresia Majors [phone]

## 2012-08-30 NOTE — Progress Notes (Signed)
Assessment unchanged. IV and tele removed. D/C instructions and RX placed in packet to go to SNF. Pt left via EMS to [facility].

## 2012-08-30 NOTE — Progress Notes (Signed)
Clinical Social Work Department CLINICAL SOCIAL WORK PLACEMENT NOTE 08/30/2012  Patient:  [name] Page, [name] Page  Account Number:  [id number] Admit date:  08/24/2012  Clinical Social Worker:  Robin Searing  Date/time:  08/29/2012 02:16 PM  Clinical Social Work is seeking post-discharge placement for this patient at the following level of care:   SKILLED NURSING   (*CSW will update this form in Epic as items are completed)   08/29/2012  Patient/family provided with [facility] Department of Clinical Social Work's list of facilities offering this level of care within the geographic area requested by the patient (or if unable, by the patient's family).  08/29/2012  Patient/family informed of their freedom to choose among providers that offer the needed level of care, that participate in Medicare, Medicaid or managed care program needed by the patient, have an available bed and are willing to accept the patient.  08/29/2012  Patient/family informed of [facility]' ownership interest in [facility], as well as of the fact that they are under no obligation to receive care at this facility.  PASARR submitted to EDS on 08/29/2012 PASARR number received from EDS on 08/29/2012  FL2 transmitted to all facilities in geographic area requested by pt/family on  08/29/2012 FL2 transmitted to all facilities within larger geographic area on   Patient informed that his/her managed care company has contracts with or will negotiate with  certain facilities, including the following:     Patient/family informed of bed offers received:  08/29/2012 Patient chooses bed at [facility] Physician recommends and patient chooses bed at    Patient to be transferred to [facility] on  08/30/2012 Patient to be transferred to facility by EMS  The following physician request were entered in Epic:   Additional Comments: Reece Levy, MSW, Theresia Majors [phone]

## 2012-08-30 NOTE — Progress Notes (Signed)
[name] Page 10:59 AM  Subjective: Other than a sore throat patient is doing fine after his ERCP and has no specific and is tolerating clear liquid  Objective: Vital signs stable afebrile no acute distress abdomen is nontender LFTs decreased  Assessment: Status post ERCP and stone extraction  Plan: Consider surgical consult at some point for laparoscopic cholecystectomy and cholecystectomy otherwise slowly advance diet and follow liver tests normal and I am happy to see back when necessary  [facility] E

## 2012-08-30 NOTE — Progress Notes (Signed)
Internal Medicine Teaching Service Attending Note Date: 08/30/2012  Patient name: [name] Page  Medical record number: [id number]  Date of birth: [birth year]   Doing well. Complains of sore throat. Denies abdominal pain.   Filed Vitals:   08/30/12 0947  BP: 130/87  Pulse: 94  Temp:   Resp:    No acute distress Jaundice, clearing S1S2 regular, not tachycardic Lungs clear to auscultation Soft, no tender abdomen No pedal edema    Recent Labs Lab 08/24/12 1832  08/27/12 0515 08/28/12 0515 08/29/12 0450 08/29/12 1122 08/30/12 0625  AST 109*  < > 63* 55* 51* 64* 54*  ALT 109*  < > 65* 59* 53 62* 53  ALKPHOS 161*  < > 123* 133* 126* 147* 135*  BILITOT 3.2*  < > 6.3* 7.3* 6.0* 7.1* 5.8*  PROT 7.1  < > 6.4 6.5 6.2 7.1 6.3  ALBUMIN 3.5  < > 3.0* 3.0* 2.8* 3.2* 2.9*  INR 1.04  --   --  1.08  --   --   --   < > = values in this interval not displayed.    Recent Labs Lab 08/28/12 0515 08/29/12 0450 08/30/12 0625  HGB 13.5 12.9* 13.2  HCT 37.8* 36.2* 36.3*  WBC 6.4 6.3 6.7  PLT 133* 137* 158    Recent Labs Lab 08/24/12 1849 08/24/12 2031  08/25/12 0530  08/27/12 0515 08/28/12 0515 08/29/12 0450 08/29/12 1122 08/30/12 0625  NA 140  --   --  137  < > 134* 133* 130* 132* 134*  K 3.6  --   --  3.3*  < > 3.7 3.3* 3.4* 3.4* 3.6  CL 104  --   --  102  < > 102 102 100 98 100  CO2  --   --   --  25  < > 23 23 21 25 25   GLUCOSE 121*  --   --  112*  < > 110* 99 92 106* 94  BUN 26*  --   --  29*  < > 19 19 22 22 20   CREATININE 1.90*  --   < > 1.83*  < > 1.46* 1.42* 1.46* 1.41* 1.48*  CALCIUM  --   --   --  8.0*  < > 8.7 8.9 8.7 9.0 9.3  MG  --  1.6  --  2.5  --   --   --   --   --   --   < > = values in this interval not displayed.  Marland Kitchen aspirin EC  81 mg Oral Daily  . carbamide peroxide  5 drop Both Ears BID  . doxycycline  100 mg Oral Q12H  . fentaNYL  50-100 mcg Intravenous Once  . metoprolol tartrate  100 mg Oral BID  . pantoprazole  40 mg Oral Daily  . sodium  chloride  3 mL Intravenous Q12H     Assessment and Plan   At this point per GI, who seem have signed off, the patient is stable. His liver function tests are trending down. He appears clinically better. Medically, we can discharge him and he can be observed as outpatient. He will be continued on Ciprofloxacin PO to complete a course of antibiotics for 14 days.   For Afib, we will discharge him on Metoprolol 100 BID. He does not want coumadin or aspirin.   Follow ups needed: PCP (to follow LFTs) and surgical consult for elective cholecystectomy.   Aletta Edouard 08/30/2012, 12:47 PM.

## 2012-08-30 NOTE — Progress Notes (Signed)
Subjective: He complains of no abdominal pain but he has a sorethroat. His ears were irrigated yesterday with minimum earwax removal. Another ear irrigation was done today with minimum earwax removal, his hearing has not improved.  Objective: Vital signs in last 24 hours: Filed Vitals:   08/29/12 2046 08/30/12 0426 08/30/12 0946 08/30/12 0947  BP: 129/81 123/75  130/87  Pulse: 89 87 105 94  Temp: 98.3 F (36.8 C) 98.6 F (37 C)    TempSrc: Oral Axillary    Resp: 19 18    Height:      Weight:  165 lb 5.5 oz (75 kg)    SpO2: 96% 97%     Weight change: -1 lb 5.2 oz (-0.6 kg)  Intake/Output Summary (Last 24 hours) at 08/30/12 1316 Last data filed at 08/30/12 1310  Gross per 24 hour  Intake 1531.33 ml  Output   1235 ml  Net 296.33 ml   Vitals reviewed. General: Hard of hearrhing. Sitting up in chair, in NAD. Lips and tongue red from previous food HEENT: mild scleral icterus Cardiac: RRR, no R/M/G Pulm: clear to auscultation bilaterally, no wheezes, rales, or rhonchi Abd: soft, nontender, nondistended, BS present Ext: warm and well perfused, no pedal edema Neuro: alert and oriented X3, moves 4 extremities voluntarily  Lab Results: Basic Metabolic Panel:  Recent Labs Lab 08/24/12 2031  08/25/12 0530  08/29/12 1122 08/30/12 0625  NA  --   --  137  < > 132* 134*  K  --   --  3.3*  < > 3.4* 3.6  CL  --   --  102  < > 98 100  CO2  --   --  25  < > 25 25  GLUCOSE  --   --  112*  < > 106* 94  BUN  --   --  29*  < > 22 20  CREATININE  --   < > 1.83*  < > 1.41* 1.48*  CALCIUM  --   --  8.0*  < > 9.0 9.3  MG 1.6  --  2.5  --   --   --   < > = values in this interval not displayed. Liver Function Tests:  Recent Labs Lab 08/29/12 1122 08/30/12 0625  AST 64* 54*  ALT 62* 53  ALKPHOS 147* 135*  BILITOT 7.1* 5.8*  PROT 7.1 6.3  ALBUMIN 3.2* 2.9*    Recent Labs Lab 08/24/12 2308  LIPASE 25   CBC:  Recent Labs Lab 08/24/12 1832  08/29/12 0450 08/30/12 0625    WBC 12.0*  < > 6.3 6.7  NEUTROABS 10.2*  --   --  4.4  HGB 14.4  < > 12.9* 13.2  HCT 39.2  < > 36.2* 36.3*  MCV 97.3  < > 97.6 96.0  PLT 144*  < > 137* 158  < > = values in this interval not displayed. Cardiac Enzymes:  Recent Labs Lab 08/24/12 1833 08/24/12 2300 08/25/12 0530  TROPONINI <0.30 <0.30 <0.30   CBG:  Recent Labs Lab 08/24/12 2232 08/25/12 0744 08/25/12 1156 08/25/12 1721 08/25/12 2100 08/26/12 0728  GLUCAP 142* 92 116* 104* 102* 90   Hemoglobin A1C:  Recent Labs Lab 08/24/12 2031  HGBA1C 5.2   Fasting Lipid Panel:  Recent Labs Lab 08/24/12 2300  CHOL 142  HDL 48  LDLCALC 84  TRIG 49  CHOLHDL 3.0   Thyroid Function Tests:  Recent Labs Lab 08/24/12 2031 08/26/12 1454  TSH  0.966  --   FREET4  --  1.21   Coagulation:  Recent Labs Lab 08/24/12 1832 08/28/12 0515  LABPROT 13.5 13.9  INR 1.04 1.08   Urine Drug Screen: Drugs of Abuse     Component Value Date/Time   LABOPIA NONE DETECTED 08/24/2012 1915   COCAINSCRNUR NONE DETECTED 08/24/2012 1915   LABBENZ NONE DETECTED 08/24/2012 1915   AMPHETMU NONE DETECTED 08/24/2012 1915   THCU NONE DETECTED 08/24/2012 1915   LABBARB NONE DETECTED 08/24/2012 1915    Alcohol Level:  Recent Labs Lab 08/24/12 1832  ETH <11   Urinalysis:  Recent Labs Lab 08/24/12 1915  COLORURINE YELLOW  LABSPEC 1.015  PHURINE 8.0  GLUCOSEU NEGATIVE  HGBUR NEGATIVE  BILIRUBINUR NEGATIVE  KETONESUR NEGATIVE  PROTEINUR NEGATIVE  UROBILINOGEN 2.0*  NITRITE NEGATIVE  LEUKOCYTESUR TRACE*    Micro Results: Recent Results (from the past 240 hour(s))  URINE CULTURE     Status: None   Collection Time    08/24/12  7:15 PM      Result Value Range Status   Specimen Description URINE, CLEAN CATCH   Final   Special Requests NONE   Final   Culture  Setup Time 08/25/2012 02:40   Final   Colony Count >=100,000 COLONIES/ML   Final   Culture     Final   Value: STAPHYLOCOCCUS SPECIES (COAGULASE NEGATIVE)      Note: RIFAMPIN AND GENTAMICIN SHOULD NOT BE USED AS SINGLE DRUGS FOR TREATMENT OF STAPH INFECTIONS.   Report Status 08/27/2012 FINAL   Final   Organism ID, Bacteria STAPHYLOCOCCUS SPECIES (COAGULASE NEGATIVE)   Final  CULTURE, BLOOD (ROUTINE X 2)     Status: None   Collection Time    08/24/12  8:40 PM      Result Value Range Status   Specimen Description BLOOD RIGHT ARM   Final   Special Requests BOTTLES DRAWN AEROBIC AND ANAEROBIC 10CC EA   Final   Culture  Setup Time 08/25/2012 14:14   Final   Culture     Final   Value:        BLOOD CULTURE RECEIVED NO GROWTH TO DATE CULTURE WILL BE HELD FOR 5 DAYS BEFORE ISSUING A FINAL NEGATIVE REPORT   Report Status PENDING   Incomplete  CULTURE, BLOOD (ROUTINE X 2)     Status: None   Collection Time    08/24/12  8:45 PM      Result Value Range Status   Specimen Description BLOOD RIGHT HAND   Final   Special Requests BOTTLES DRAWN AEROBIC AND ANAEROBIC 10CC EA   Final   Culture  Setup Time 08/25/2012 14:14   Final   Culture     Final   Value:        BLOOD CULTURE RECEIVED NO GROWTH TO DATE CULTURE WILL BE HELD FOR 5 DAYS BEFORE ISSUING A FINAL NEGATIVE REPORT   Report Status PENDING   Incomplete  MRSA PCR SCREENING     Status: None   Collection Time    08/24/12 10:35 PM      Result Value Range Status   MRSA by PCR NEGATIVE  NEGATIVE Final   Comment:            The GeneXpert MRSA Assay (FDA     approved for NASAL specimens     only), is one component of a     comprehensive MRSA colonization     surveillance program. It is not     intended  to diagnose MRSA     infection nor to guide or     monitor treatment for     MRSA infections.   Studies/Results: Dg Ercp Biliary & Pancreatic Ducts  08/29/2012   *RADIOLOGY REPORT*  Clinical Data: ERCP for gallstone removal  ERCP  Comparison: MRCP - 08/27/2012; CT abdomen pelvis - 08/25/2012  Findings:  Four spot intraoperative radiographic images of the right upper quadrant during ERCP are provided for  review.  Images demonstrate an ERCP probe overlying the right upper abdominal quadrant.  There is selective cannulation and opacification of the common bile duct which appears non-dilated.  There is a discrete and persistent filling defect within the mid/distal aspect of the common bile duct which likely correlates with the gallstones seen on preprocedural MRCP.  Subsequent images demonstrate insufflation of a sphincterotomy balloon within the more proximal common bile duct.  There is opacification of the cystic duct and gallbladder which contains a large filling defect compatible with the known gallstone.  There is minimal opacification of the central aspect of the intrahepatic biliary tree which appears non dilated.  There is no definite opacification of the pancreatic duct.  IMPRESSION: 1.  ERCP with findings of choledocholithiasis and subsequent balloon sphincterotomy. 2.  Cholelithiasis.  The cystic duct appears patent.   Original Report Authenticated By: Tacey Ruiz, MD   Dg Foot 2 Views Right  08/29/2012   *RADIOLOGY REPORT*  Clinical Data: Evaluate for osteomyelitis or fracture  RIGHT FOOT - 2 VIEW  Comparison: None.  Findings: Negative for fracture.  No cortical erosion or osteomyelitis.  There is calcaneal spurring.  IMPRESSION: No acute abnormality.   Original Report Authenticated By: Janeece Riggers, M.D.   Medications: I have reviewed the patient's current medications. Scheduled Meds: . aspirin EC  81 mg Oral Daily  . carbamide peroxide  5 drop Both Ears BID  . ciprofloxacin  500 mg Oral BID  . fentaNYL  50-100 mcg Intravenous Once  . metoprolol tartrate  100 mg Oral BID  . pantoprazole  40 mg Oral Daily  . sodium chloride  3 mL Intravenous Q12H   Continuous Infusions:  PRN Meds:.fentaNYL, midazolam, ondansetron (ZOFRAN) IV, ondansetron Assessment/Plan:  Atrial fibrillation with rapid ventricular response - Remains rate controlled. Precipitant for RVR likely UTI and gallbladder disease.  He did not tolerate diltiazem drip well with hypotension and is now off this medication. His 2D echo revealed an EF of 55-60% with no wall motion abnormality, mild left atrium enlargement.  -Continue telemetry  -metoprolol 100mg  BID.   Hyperbilirubinemia - He is now s/p ERCP with multiple small CBD stones removed, sphincterotomy and balloon dilation of the ampulla. T bili trending down post ERCP. No abdominal pain, tolerating intake per mouth well.  -Appreciate GI consult help managing this patient  -CMP daily   UTI - UA with pyuria, urine culture with Staph aureus. Susceptibility shows MRSA sensitive to tetracycline, likely covered by imipenem.  -Discontinued Vancomycin  -Discontinued imipenem today, pt is asymptomatic  SIRS (systemic inflammatory response syndrome) - Resolved (afebrile, with WBC wnl, but persistent A.fib with RVR). Urine culture positive for MRSA. Blood cultures with NGTD. He is now s/p ERCP with retrieval of multiple small CBD stones, and balloon dilation of the ampulla.  -Fluids for hypotension prn  -Continue imipenem as pt is allergic to penicillin  -Discontinued Vancomycin for UTI, pt received imipenem (for cholecystitis)  GERD - Protonix daily and stable.   CKD (chronic kidney disease), stage III - Creatinine 1.9  on admission, trending down, to 1.4. Will monitor closely.   Generalized weakness - He complained of generalized weakness on admission. UTI could be a contributing factor as well as A.fib with RVR. PT/OT recommend 24 hr supervision/SNF. His wife explains that their son is currently taking care of his wife who is in hospice care at home and she is unable to care for Mr. Jeffords by herself. He will be discharged to Rehab facility.  -Social worker consult for possible placement-Bed available for 6/20 at [facility]   Hearing impairment - Likely secondary to bilateral cerumen impaction. His wife tells me that he had his ears "cleaned out" by ENT last year and his  hearing was "good" then. He does have bilateral cerumen impaction on physical exam. He had minimum earwax removed today, will continue debrox drops.  -Debrox BID bilaterally  -Ear irrigation bilaterally   Dispo: Disposition is deferred at this time, awaiting improvement of current medical problems.  Anticipated discharge in approximately today. .   The patient does have a current PCP Mathis Dad, MD) and does need an [facility] hospital follow-up appointment after discharge.  The patient does not have transportation limitations that hinder transportation to clinic appointments.  .Services Needed at time of discharge: Y = Yes, Blank = No PT:   OT:   RN:   Equipment:   Other:     LOS: 6 days   Ky Barban, MD 08/30/2012, 1:16 PM

## 2012-08-31 LAB — CULTURE, BLOOD (ROUTINE X 2): Culture: NO GROWTH

## 2012-09-03 NOTE — Discharge Summary (Signed)
Internal Medicine Teaching Service Attending Note Date: 09/03/2012  Patient name: [name] Page  Medical record number: [id number]  Date of birth: June 03, 1924    This patient was mainly treated for SIRS likely secondary to choledocholithiasis and cholecystitis. I evaluated the patient on the day of discharge and discussed the discharge plan with my resident team. I agree with the discharge documentation and disposition.     Thanks Aletta Edouard 09/03/2012, 3:54 PM

## 2012-09-06 ENCOUNTER — Ambulatory Visit: Payer: Medicare Other | Admitting: Internal Medicine

## 2012-09-19 ENCOUNTER — Telehealth: Payer: Self-pay | Admitting: *Deleted

## 2012-09-19 NOTE — Telephone Encounter (Signed)
Thank you. Agree.

## 2012-09-19 NOTE — Telephone Encounter (Signed)
Jan with [employer] [phone] called to get approval to cont PT at home twice a week and to cont for 4 weeks. Will send paperwork to clinic - approval given to cont. Stanton Kidney Layana Konkel RN 09/19/12 10:15AM

## 2012-09-19 NOTE — Telephone Encounter (Signed)
Jan aware with [employer].

## 2012-10-01 ENCOUNTER — Other Ambulatory Visit: Payer: Self-pay | Admitting: Internal Medicine

## 2012-10-01 ENCOUNTER — Encounter: Payer: Self-pay | Admitting: Internal Medicine

## 2012-10-01 ENCOUNTER — Ambulatory Visit (INDEPENDENT_AMBULATORY_CARE_PROVIDER_SITE_OTHER): Payer: Medicare Other | Admitting: Internal Medicine

## 2012-10-01 VITALS — BP 73/49 | HR 60 | Temp 96.9°F | Ht 70.0 in | Wt 171.3 lb

## 2012-10-01 DIAGNOSIS — M25511 Pain in right shoulder: Secondary | ICD-10-CM | POA: Insufficient documentation

## 2012-10-01 DIAGNOSIS — M542 Cervicalgia: Secondary | ICD-10-CM | POA: Insufficient documentation

## 2012-10-01 DIAGNOSIS — I4891 Unspecified atrial fibrillation: Secondary | ICD-10-CM

## 2012-10-01 DIAGNOSIS — M25519 Pain in unspecified shoulder: Secondary | ICD-10-CM

## 2012-10-01 DIAGNOSIS — I10 Essential (primary) hypertension: Secondary | ICD-10-CM

## 2012-10-01 DIAGNOSIS — H6123 Impacted cerumen, bilateral: Secondary | ICD-10-CM

## 2012-10-01 DIAGNOSIS — I959 Hypotension, unspecified: Secondary | ICD-10-CM | POA: Insufficient documentation

## 2012-10-01 DIAGNOSIS — R350 Frequency of micturition: Secondary | ICD-10-CM

## 2012-10-01 DIAGNOSIS — M79661 Pain in right lower leg: Secondary | ICD-10-CM | POA: Insufficient documentation

## 2012-10-01 DIAGNOSIS — H6120 Impacted cerumen, unspecified ear: Secondary | ICD-10-CM

## 2012-10-01 DIAGNOSIS — M79609 Pain in unspecified limb: Secondary | ICD-10-CM

## 2012-10-01 LAB — CBC WITH DIFFERENTIAL/PLATELET
Eosinophils Absolute: 1 10*3/uL — ABNORMAL HIGH (ref 0.0–0.7)
Eosinophils Relative: 15 % — ABNORMAL HIGH (ref 0–5)
Hemoglobin: 13.5 g/dL (ref 13.0–17.0)
Lymphocytes Relative: 27 % (ref 12–46)
Lymphs Abs: 1.7 10*3/uL (ref 0.7–4.0)
MCH: 35.2 pg — ABNORMAL HIGH (ref 26.0–34.0)
MCV: 100.3 fL — ABNORMAL HIGH (ref 78.0–100.0)
Monocytes Relative: 14 % — ABNORMAL HIGH (ref 3–12)
Platelets: 156 10*3/uL (ref 150–400)
RBC: 3.84 MIL/uL — ABNORMAL LOW (ref 4.22–5.81)
WBC: 6.5 10*3/uL (ref 4.0–10.5)

## 2012-10-01 LAB — COMPLETE METABOLIC PANEL WITH GFR
ALT: 33 U/L (ref 0–53)
AST: 27 U/L (ref 0–37)
Alkaline Phosphatase: 108 U/L (ref 39–117)
BUN: 28 mg/dL — ABNORMAL HIGH (ref 6–23)
Creat: 1.8 mg/dL — ABNORMAL HIGH (ref 0.50–1.35)
Total Bilirubin: 1 mg/dL (ref 0.3–1.2)

## 2012-10-01 LAB — URINALYSIS, MICROSCOPIC ONLY

## 2012-10-01 LAB — URINALYSIS, ROUTINE W REFLEX MICROSCOPIC
Glucose, UA: NEGATIVE mg/dL
Hgb urine dipstick: NEGATIVE
Nitrite: NEGATIVE
Specific Gravity, Urine: 1.021 (ref 1.005–1.030)
pH: 7 (ref 5.0–8.0)

## 2012-10-01 NOTE — Assessment & Plan Note (Signed)
Increased cerumen in ears today RN [place] irrigated ears with relief

## 2012-10-01 NOTE — Patient Instructions (Addendum)
General Instructions: Please stop taking Diovan (Valsartan-Hydrocholorthiazide), Norvasc.  Continue to take but cut it in half Metoprolol 50 mg bid.  Follow up in 1 week.   Treatment Goals:  Goals (1 Years of Data) as of 10/01/12     Lifestyle    . Prevent Falls       Progress Toward Treatment Goals:  Treatment Goal 10/01/2012  Blood pressure unable to assess    Self Care Goals & Plans:  Self Care Goal 10/01/2012  Manage my medications take my medicines as prescribed; bring my medications to every visit; refill my medications on time  Monitor my health keep track of my blood pressure  Eat healthy foods drink diet soda or water instead of juice or soda; eat more vegetables; eat foods that are low in salt; eat baked foods instead of fried foods; eat fruit for snacks and desserts; eat smaller portions  Meeting treatment goals maintain the current self-care plan       Care Management & Community Referrals:  Referral 10/01/2012  Referrals made for care management support none needed  Referrals made to community resources none     Atrial Fibrillation Your caregiver has diagnosed you with atrial fibrillation (AFib). The heart normally beats very regularly; AFib is a type of irregular heartbeat. The heart rate may be faster or slower than normal. This can prevent your heart from pumping as well as it should. AFib can be constant (chronic) or intermittent (paroxysmal). CAUSES  Atrial fibrillation may be caused by:  Heart disease, including heart attack, coronary artery disease, heart failure, diseases of the heart valves, and others.  Blood clot in the lungs (pulmonary embolism).  Pneumonia or other infections.  Chronic lung disease.  Thyroid disease.  Toxins. These include alcohol, some medications (such as decongestant medications or diet pills), and caffeine. In some people, no cause for AFib can be found. This is referred to as Lone Atrial Fibrillation. SYMPTOMS    Palpitations or a fluttering in your chest.  A vague sense of chest discomfort.  Shortness of breath.  Sudden onset of lightheadedness or weakness. Sometimes, the first sign of AFib can be a complication of the condition. This could be a stroke or heart failure. DIAGNOSIS  Your description of your condition may make your caregiver suspicious of atrial fibrillation. Your caregiver will examine your pulse to determine if fibrillation is present. An EKG (electrocardiogram) will confirm the diagnosis. Further testing may help determine what caused you to have atrial fibrillation. This may include chest x-ray, echocardiogram, blood tests, or CT scans. PREVENTION  If you have previously had atrial fibrillation, your caregiver may advise you to avoid substances known to cause the condition (such as stimulant medications, and possibly caffeine or alcohol). You may be advised to use medications to prevent recurrence. Proper treatment of any underlying condition is important to help prevent recurrence. PROGNOSIS  Atrial fibrillation does tend to become a chronic condition over time. It can cause significant complications (see below). Atrial fibrillation is not usually immediately life-threatening, but it can shorten your life expectancy. This seems to be worse in women. If you have lone atrial fibrillation and are under 41 years old, the risk of complications is very low, and life expectancy is not shortened. RISKS AND COMPLICATIONS  Complications of atrial fibrillation can include stroke, chest pain, and heart failure. Your caregiver will recommend treatments for the atrial fibrillation, as well as for any underlying conditions, to help minimize risk of complications. TREATMENT  Treatment for AFib  is divided into several categories:  Treatment of any underlying condition.  Converting you out of AFib into a regular (sinus) rhythm.  Controlling rapid heart rate.  Prevention of blood clots and  stroke. Medications and procedures are available to convert your atrial fibrillation to sinus rhythm. However, recent studies have shown that this may not offer you any advantage, and cardiac experts are continuing research and debate on this topic. More important is controlling your rapid heartbeat. The rapid heartbeat causes more symptoms, and places strain on your heart. Your caregiver will advise you on the use of medications that can control your heart rate. Atrial fibrillation is a strong stroke risk. You can lessen this risk by taking blood thinning medications such as Coumadin (warfarin), or sometimes aspirin. These medications need close monitoring by your caregiver. Over-medication can cause bleeding. Too little medication may not protect against stroke. HOME CARE INSTRUCTIONS   If your caregiver prescribed medicine to make your heartbeat more normally, take as directed.  If blood thinners were prescribed by your caregiver, take EXACTLY as directed.  Perform blood tests EXACTLY as directed.  Quit smoking. Smoking increases your cardiac and lung (pulmonary) risks.  DO NOT drink alcohol.  DO NOT drink caffeinated drinks (e.g. coffee, soda, chocolate, and leaf teas). You may drink decaffeinated coffee, soda or tea.  If you are overweight, you should choose a reduced calorie diet to lose weight. Please see a registered dietitian if you need more information about healthy weight loss. DO NOT USE DIET PILLS as they may aggravate heart problems.  If you have other heart problems that are causing AFib, you may need to eat a low salt, fat, and cholesterol diet. Your caregiver will tell you if this is necessary.  Exercise every day to improve your physical fitness. Stay active unless advised otherwise.  If your caregiver has given you a follow-up appointment, it is very important to keep that appointment. Not keeping the appointment could result in heart failure or stroke. If there is any  problem keeping the appointment, you must call back to this facility for assistance. SEEK MEDICAL CARE IF:  You notice a change in the rate, rhythm or strength of your heartbeat.  You develop an infection or any other change in your overall health status. SEEK IMMEDIATE MEDICAL CARE IF:   You develop chest pain, abdominal pain, sweating, weakness or feel sick to your stomach (nausea).  You develop shortness of breath.  You develop swollen feet and ankles.  You develop dizziness, numbness, or weakness of your face or limbs, or any change in vision or speech. MAKE SURE YOU:   Understand these instructions.  Will watch your condition.  Will get help right away if you are not doing well or get worse. Document Released: 02/27/2005 Document Revised: 05/22/2011 Document Reviewed: 10/06/2009 [facility] Patient Information 2014 [place], [place].    Hypotension As your heart beats, it forces blood through your arteries. This force is your blood pressure. If your blood pressure is too low for you to go about your normal activities or support the organs of your body, you have hypotension, or low blood pressure. When your blood pressure becomes too low, you may not get enough blood to your brain, and you may feel weak, lightheaded, or develop a more rapid heart rate. In a more severe case, you may faint: this is a sudden, brief loss of consciousness where you pass out and recover completely. CAUSES  Loss of blood or fluids from the body.  This occurs during rapid blood loss. It can also come from dehydration when the body is not taking in enough fluids or is losing fluids faster than they can be replaced. Examples of this would be severe vomiting and diarrhea.  Not taking in enough fluids and salts. This is common in the elderly where thirst mechanisms are not working as well. This means you do not feel thirsty and you do not take in enough water.  Use of blood pressure pills and other medications  that may lower the blood pressure below normal.  Over medication (always take your medications as directed).  Irregular heart beat or heart failure when the heart is no longer working well enough to support blood pressure. Hospitalization is sometimes required for low blood pressure if fluid or blood replacement is needed, if time is needed for medications to wear off, or if further evaluation is needed. Less common causes of low blood pressure might include peripheral or autonomic neuropathy (nerve problems), Parkinson's disease, or other illnesses. Treatment might include a change in diet, change in medications (including medicines aimed at raising your blood pressure), and use of support stockings. HOME CARE INSTRUCTIONS   Maintain good fluid intake and use a little more salt on your food (if you are not on a restricted diet or having problems with your heart such as heart failure). This is especially important for the elderly when you may not feel thirsty in the winter.  Take your medications as directed.  Get up slowly from reclining or sitting positions. This gives your blood pressure a chance to adjust. As we grow older our ability to regulate our blood pressure may not be as good as when we were younger.  Wear support stockings if prescribed.  Use walkers, canes, etc., if advised.  Talk with your physician or nurse about a Home Safety Evaluation (usually done by visiting nurses). SEEK IMMEDIATE MEDICAL CARE IF:   You have a fainting episode. Do not drive yourself. Call 911 if no other help is available.  You have chest pain, nausea (feeling sick to your stomach) or vomiting.  You have a loss of feeling in some part of your body, or lose movement in your arms or legs.  You have difficulty with speech.  You become sweaty and/or feel light headed. Make sure you are re-checked as instructed. MAKE SURE YOU:   Understand these instructions.  Will watch your condition.  Will get  help right away if you are not doing well or get worse. Document Released: 02/27/2005 Document Revised: 05/22/2011 Document Reviewed: 10/18/2007 [facility] Patient Information 2014 [place], [place].

## 2012-10-01 NOTE — Assessment & Plan Note (Addendum)
Actually patient is hypotensive today and still taking Norvasc and Diovan after being discharged 08/2012 and was told to stop Will d/c Norvasc and Diovan today. Consider adding back if blood pressure rises  Will check BP again in 1 week. Patient does not seem symptomatic with hypotension and hypotension possibly due to decreased oral intake and medication related  Will check CMET, CBC, UA, Urine culture today

## 2012-10-01 NOTE — Assessment & Plan Note (Signed)
Etiology may be related to medication i.e Diovan though patient just had UTI with staph coag neg Will repeat UA, urine culture today

## 2012-10-01 NOTE — Assessment & Plan Note (Signed)
Possibly related to degenerative changes/arthritis  Continue Ultram prn Will not do NSAIDS due to h/o CKD

## 2012-10-01 NOTE — Assessment & Plan Note (Addendum)
  BP Readings from Last 3 Encounters:  10/01/12 73/49  08/30/12 130/87  08/30/12 130/87    Lab Results  Component Value Date   NA 134* 10/01/2012   K 5.3 10/01/2012   CREATININE 1.80* 10/01/2012    Assessment: Blood pressure control:  (hypotensive today) Progress toward BP goal:  unable to assess Comments: none  Plan: Medications:  stop Norvasc and diovan may add it back in the future Educational resources provided: brochure;handout Self management tools provided: home blood pressure logbook Other plans:  Actually patient is hypotensive today and still taking Norvasc and Diovan after being discharged 08/2012 and was told to stop Will d/c Norvasc and Diovan today. Consider adding back if blood pressure rises  Will check BP again in 1 week. Patient does not seem symptomatic with hypotension and hypotension possibly due to decreased oral intake and medication related  Will check CMET, CBC, UA, Urine culture today

## 2012-10-01 NOTE — Assessment & Plan Note (Addendum)
No signs of menigitis. Seems musculoskeletal Patient may try heat and Ultram prn Will avoid sedating meds due to high risk of falls.  Consider CT cervical spine to look for structural abnormalities as cause of pain

## 2012-10-01 NOTE — Assessment & Plan Note (Signed)
[place] 10/2011 with Baker cyst right knee and atherosclerotic vasc disease superficial femoral and popliteal on the right.  Both which could be source of pain  Ultram prn Will not image further today as does not seem clinically like DVT.

## 2012-10-01 NOTE — Assessment & Plan Note (Signed)
Rate controlled though still in Afib According to d/c summary patient and wife did not want anticoagulation due to risk of GIB Will continue Metoprolol but due to hypotension will reduce dose to 50 mg bid from 100 mg bid

## 2012-10-01 NOTE — Progress Notes (Signed)
Subjective:    Patient ID: [name] Page, male    DOB: [birth year], 79 y.o.   MRN: [id number]  HPI Comments: 79 y.o PMH HTN (BP is 80/55 sitting HR 97 and standing 73/49 HR 60), Atrial fibrillation, UTI (staph coag neg), CKD, GERD, hard of hearing, gallstones.  He presents for hospital follow up after being discharged 08/2012 with SIRS, Afib with RVR, and choledocholithiasis status post sphincterotomy.  He went to [facility] but now is home after hospitalization.  He lives at home with his wife.  Reviewed home meds today though wife if unsure and only knows medications by color of pills first she states she is giving him 2 pills daily of Diovan, Metoprolol 1-2 times daily, Ultram as needed, Prilosec daily, Norvasc daily.  The nursing home facility was giving him these medications as well though the d/c summary said to d/c norvasc, ultram, and diovan.  His wife has not given him any medications except Ultram today.  Patient states he is feeling fine today.    Patient is having posterior neck pain and limited range of motion.  The neck pain and stiffness has been going on for 2-3 months.  He denies h/a, fever or chills.  Of noted [place] lower extremities 10/2011 should athersclerosis vascular disease affecting superficial femoral and right popliteal and right bakers cysts.    Patient c/o right posterior lower leg/cal pain which has been going on for 6-8 months intermittent worse with movement and hurts more with plantar and dorsiflexion.    Patient states his ears are stopped up making it hard to hear.    Patient also mentions right shoulder pain.    Wife states patient has decreased appetite for foods and liquids and is eating a lot of junk food and not drinking enough water.  Wife denies falls at home  Other ROS per HPI     Review of Systems  Constitutional: Negative for fever and chills.  HENT: Positive for neck pain and neck stiffness.   Eyes: Negative for photophobia.    Respiratory: Negative for shortness of breath.   Cardiovascular: Negative for chest pain.  Gastrointestinal: Negative for abdominal pain and blood in stool.  Genitourinary: Negative for dysuria and hematuria.       Increased urinary frequency every 30 minutes   Musculoskeletal: Positive for arthralgias.       Right shoulder pain   Neurological: Negative for dizziness, light-headedness and headaches.       Objective:   Physical Exam  Nursing note and vitals reviewed. Constitutional: He is oriented to person, place, and time. He appears well-developed and well-nourished. He is cooperative.  HENT:  Head: Normocephalic and atraumatic.  Right Ear: Decreased hearing is noted.  Left Ear: Decreased hearing is noted.  Mouth/Throat: Oropharynx is clear and moist and mucous membranes are normal. He has dentures. No oropharyngeal exudate.  Cerumen to b/l ears   Eyes: Conjunctivae are normal. Pupils are equal, round, and reactive to light. Right eye exhibits no discharge. Left eye exhibits no discharge. No scleral icterus.  Neck: No Brudzinski's sign and no Kernig's sign noted.    Decreased ROM with lateral rotation to neck b/l and touching chin to chest  Cardiovascular: Normal rate, S1 normal, S2 normal and normal heart sounds.  An irregular rhythm present.  No murmur heard. Pulmonary/Chest: Effort normal and breath sounds normal.  Abdominal: Soft. Bowel sounds are normal. He exhibits no distension. There is no tenderness.  Musculoskeletal: He exhibits no edema.  Arms:      Legs: Neurological: He is alert and oriented to person, place, and time.  Alert and oriented to person, place, not month or date but knows the year  Patient is in a wheelchair now but walks with walker at home  Skin: Skin is warm, dry and intact. No rash noted.  Psychiatric: He has a normal mood and affect. His speech is normal and behavior is normal. Judgment and thought content normal. Cognition and memory are  normal.          Assessment & Plan:  F/u 1 week

## 2012-10-02 ENCOUNTER — Encounter: Payer: Self-pay | Admitting: Internal Medicine

## 2012-10-04 ENCOUNTER — Telehealth: Payer: Self-pay | Admitting: *Deleted

## 2012-10-04 NOTE — Telephone Encounter (Signed)
Wife was checking about antibiotic from recent appt. Only could find Cipro from 08/2012 Dr Garald Braver. Talked with Dr Shirlee Latch - needs no antibiotics at this time. Called wife [name] and understood no antibiotics at this time. [name] RN 10/04/12 2:20PM

## 2012-10-04 NOTE — Progress Notes (Signed)
I saw and evaluated the patient.  I personally confirmed the key portions of the history and exam documented by Dr. McLean and I reviewed pertinent patient test results.  The assessment, diagnosis, and plan were formulated together and I agree with the documentation in the resident's note.    

## 2012-10-07 LAB — URINE CULTURE: Colony Count: >=100000

## 2012-10-08 ENCOUNTER — Telehealth: Payer: Self-pay | Admitting: Internal Medicine

## 2012-10-08 ENCOUNTER — Other Ambulatory Visit: Payer: Self-pay | Admitting: Internal Medicine

## 2012-10-08 ENCOUNTER — Telehealth: Payer: Self-pay | Admitting: *Deleted

## 2012-10-08 MED ORDER — FOSFOMYCIN TROMETHAMINE 3 G PO PACK: 3.0000 g | PACK | ORAL | Status: DC

## 2012-10-08 NOTE — Telephone Encounter (Signed)
Blood pressure was 120/80 this am.  Advised we want to treat his VRE UTI with antibiotics.  Will send to patients pharmacy  Shirlee Latch MD

## 2012-10-08 NOTE — Telephone Encounter (Signed)
Call from the pharmacy - Monurol is not covered by pt's insurance; requires prior authorization or prefers Bactrim or Septra. Thanks

## 2012-10-09 ENCOUNTER — Ambulatory Visit (INDEPENDENT_AMBULATORY_CARE_PROVIDER_SITE_OTHER): Payer: Medicare Other | Admitting: Internal Medicine

## 2012-10-09 ENCOUNTER — Telehealth: Payer: Self-pay | Admitting: *Deleted

## 2012-10-09 ENCOUNTER — Encounter: Payer: Self-pay | Admitting: Internal Medicine

## 2012-10-09 VITALS — BP 112/74 | HR 110 | Temp 97.1°F | Ht 70.0 in | Wt 176.0 lb

## 2012-10-09 DIAGNOSIS — I4891 Unspecified atrial fibrillation: Secondary | ICD-10-CM

## 2012-10-09 DIAGNOSIS — I10 Essential (primary) hypertension: Secondary | ICD-10-CM

## 2012-10-09 DIAGNOSIS — N390 Urinary tract infection, site not specified: Secondary | ICD-10-CM

## 2012-10-09 DIAGNOSIS — I959 Hypotension, unspecified: Secondary | ICD-10-CM

## 2012-10-09 DIAGNOSIS — R350 Frequency of micturition: Secondary | ICD-10-CM

## 2012-10-09 NOTE — Assessment & Plan Note (Signed)
BP is more controlled today 112/74 after discontinuing 3 agents  Still taking Lopressor 50 mg bid for AF rate control

## 2012-10-09 NOTE — Assessment & Plan Note (Signed)
Improved 112/74 after stopping Norvasc 5 and Diovan-HCTZ combination Will continue to follow at return visits.

## 2012-10-09 NOTE — Assessment & Plan Note (Addendum)
Cont. Metoprolol 50 mg bid rate is 110 today goal is <110 and he is running 102-106 at home.  Monitor and follow up

## 2012-10-09 NOTE — Assessment & Plan Note (Signed)
7/22 with VRE UTI Will tx with Fosfomycin 3 g every 72 hours x 3 doses total  Needs prior approval with insurance company.

## 2012-10-09 NOTE — Telephone Encounter (Signed)
Call to [employer] for Prior Authorization for Monurol 3 GM.   Preferred meds will not cover the UTI.   Approved 10-09-2012 thru 02-10-2013.  Call to Leonie Douglas with approval.   Angelina Ok, RN 10/09/2012 10:26 AM.

## 2012-10-09 NOTE — Assessment & Plan Note (Signed)
Likely related to UTI will treat once patient gets prior approval

## 2012-10-09 NOTE — Progress Notes (Signed)
Case discussed with Dr. McLean at the time of the visit.  We reviewed the resident's history and exam and pertinent patient test results.  I agree with the assessment, diagnosis, and plan of care documented in the resident's note.     

## 2012-10-09 NOTE — Progress Notes (Signed)
  Subjective:    Patient ID: [name] Page, male    DOB: 12-29-1924, 79 y.o.   MRN: [id number]  HPI Comments: 79 y.o PMH HTN (BP is improved today 112/74 after d/c Norvasc, Diovan-HCTZ), Atrial fibrillation (rate 110 (wife notes rate is 102-109 at home) on Metoprolol 50 mg bid, h/o UTI (staph coag neg) with now VRE UTI noted 7/22, CKD, GERD, hard of hearing, gallstones, CBD stones s/p removal.  He presents today for follow up form hypotension after we discontinued 3 of his BP medications.  BP is improved.  He was also having urinary frequency and found to have VRE UTI after discussing with pharmacy and ID (Dr. Luciana Axe) both rec. tx with Fosfomycin which needs prior authorization.    Patient states he is feeling fine today.       Review of Systems  Respiratory: Negative for shortness of breath.   Cardiovascular: Negative for chest pain.       Objective:   Physical Exam  Nursing note and vitals reviewed. Constitutional: He is oriented to person, place, and time. He appears well-developed and well-nourished. He is cooperative. No distress.  HENT:  Head: Normocephalic and atraumatic.  Mouth/Throat: Oropharynx is clear and moist and mucous membranes are normal. No oropharyngeal exudate.  Eyes: Conjunctivae are normal. Pupils are equal, round, and reactive to light. Right eye exhibits no discharge. Left eye exhibits no discharge. No scleral icterus.  Cardiovascular: S1 normal, S2 normal and normal heart sounds.  An irregularly irregular rhythm present. Tachycardia present.   No murmur heard. Rate 110. No lower extremity edema  Pulmonary/Chest: Effort normal and breath sounds normal.  Abdominal: Soft. Bowel sounds are normal. He exhibits no distension. There is no tenderness.  Musculoskeletal: He exhibits no edema.  Neurological: He is alert and oriented to person, place, and time.  Oriented to person, place, year when asked 2nd time stated 1914 then 2014.   Walks with cane in wheelchair  today  Skin: Skin is warm and dry. He is not diaphoretic.  Eczematous changes to b/l lower extremities nonpruritic   Psychiatric: He has a normal mood and affect. His speech is normal and behavior is normal. Judgment and thought content normal. Cognition and memory are normal.          Assessment & Plan:  Follow up in 2-3 months

## 2012-10-09 NOTE — Patient Instructions (Addendum)
Please follow up in 2-3 months, sooner if needed  Pick up your antibiotic from the pharmacy to take 1 pill every 3 days x 3 doses.  Pleasure to meet you again.   Urinary Tract Infection Urinary tract infections (UTIs) can develop anywhere along your urinary tract. Your urinary tract is your body's drainage system for removing wastes and extra water. Your urinary tract includes two kidneys, two ureters, a bladder, and a urethra. Your kidneys are a pair of bean-shaped organs. Each kidney is about the size of your fist. They are located below your ribs, one on each side of your spine. CAUSES Infections are caused by microbes, which are microscopic organisms, including fungi, viruses, and bacteria. These organisms are so small that they can only be seen through a microscope. Bacteria are the microbes that most commonly cause UTIs. SYMPTOMS  Symptoms of UTIs may vary by age and gender of the patient and by the location of the infection. Symptoms in young women typically include a frequent and intense urge to urinate and a painful, burning feeling in the bladder or urethra during urination. Older women and men are more likely to be tired, shaky, and weak and have muscle aches and abdominal pain. A fever may mean the infection is in your kidneys. Other symptoms of a kidney infection include pain in your back or sides below the ribs, nausea, and vomiting. DIAGNOSIS To diagnose a UTI, your caregiver will ask you about your symptoms. Your caregiver also will ask to provide a urine sample. The urine sample will be tested for bacteria and white blood cells. White blood cells are made by your body to help fight infection. TREATMENT  Typically, UTIs can be treated with medication. Because most UTIs are caused by a bacterial infection, they usually can be treated with the use of antibiotics. The choice of antibiotic and length of treatment depend on your symptoms and the type of bacteria causing your infection. HOME  CARE INSTRUCTIONS  If you were prescribed antibiotics, take them exactly as your caregiver instructs you. Finish the medication even if you feel better after you have only taken some of the medication.  Drink enough water and fluids to keep your urine clear or pale yellow.  Avoid caffeine, tea, and carbonated beverages. They tend to irritate your bladder.  Empty your bladder often. Avoid holding urine for long periods of time.  Empty your bladder before and after sexual intercourse.  After a bowel movement, women should cleanse from front to back. Use each tissue only once. SEEK MEDICAL CARE IF:   You have back pain.  You develop a fever.  Your symptoms do not begin to resolve within 3 days. SEEK IMMEDIATE MEDICAL CARE IF:   You have severe back pain or lower abdominal pain.  You develop chills.  You have nausea or vomiting.  You have continued burning or discomfort with urination. MAKE SURE YOU:   Understand these instructions.  Will watch your condition.  Will get help right away if you are not doing well or get worse. Document Released: 12/07/2004 Document Revised: 08/29/2011 Document Reviewed: 04/07/2011 [facility] Patient Information 2014 [place], [place].  Atrial Fibrillation Your caregiver has diagnosed you with atrial fibrillation (AFib). The heart normally beats very regularly; AFib is a type of irregular heartbeat. The heart rate may be faster or slower than normal. This can prevent your heart from pumping as well as it should. AFib can be constant (chronic) or intermittent (paroxysmal). CAUSES  Atrial fibrillation may be  caused by:  Heart disease, including heart attack, coronary artery disease, heart failure, diseases of the heart valves, and others.  Blood clot in the lungs (pulmonary embolism).  Pneumonia or other infections.  Chronic lung disease.  Thyroid disease.  Toxins. These include alcohol, some medications (such as decongestant medications or  diet pills), and caffeine. In some people, no cause for AFib can be found. This is referred to as Lone Atrial Fibrillation. SYMPTOMS   Palpitations or a fluttering in your chest.  A vague sense of chest discomfort.  Shortness of breath.  Sudden onset of lightheadedness or weakness. Sometimes, the first sign of AFib can be a complication of the condition. This could be a stroke or heart failure. DIAGNOSIS  Your description of your condition may make your caregiver suspicious of atrial fibrillation. Your caregiver will examine your pulse to determine if fibrillation is present. An EKG (electrocardiogram) will confirm the diagnosis. Further testing may help determine what caused you to have atrial fibrillation. This may include chest x-ray, echocardiogram, blood tests, or CT scans. PREVENTION  If you have previously had atrial fibrillation, your caregiver may advise you to avoid substances known to cause the condition (such as stimulant medications, and possibly caffeine or alcohol). You may be advised to use medications to prevent recurrence. Proper treatment of any underlying condition is important to help prevent recurrence. PROGNOSIS  Atrial fibrillation does tend to become a chronic condition over time. It can cause significant complications (see below). Atrial fibrillation is not usually immediately life-threatening, but it can shorten your life expectancy. This seems to be worse in women. If you have lone atrial fibrillation and are under 79 years old, the risk of complications is very low, and life expectancy is not shortened. RISKS AND COMPLICATIONS  Complications of atrial fibrillation can include stroke, chest pain, and heart failure. Your caregiver will recommend treatments for the atrial fibrillation, as well as for any underlying conditions, to help minimize risk of complications. TREATMENT  Treatment for AFib is divided into several categories:  Treatment of any underlying  condition.  Converting you out of AFib into a regular (sinus) rhythm.  Controlling rapid heart rate.  Prevention of blood clots and stroke. Medications and procedures are available to convert your atrial fibrillation to sinus rhythm. However, recent studies have shown that this may not offer you any advantage, and cardiac experts are continuing research and debate on this topic. More important is controlling your rapid heartbeat. The rapid heartbeat causes more symptoms, and places strain on your heart. Your caregiver will advise you on the use of medications that can control your heart rate. Atrial fibrillation is a strong stroke risk. You can lessen this risk by taking blood thinning medications such as Coumadin (warfarin), or sometimes aspirin. These medications need close monitoring by your caregiver. Over-medication can cause bleeding. Too little medication may not protect against stroke. HOME CARE INSTRUCTIONS   If your caregiver prescribed medicine to make your heartbeat more normally, take as directed.  If blood thinners were prescribed by your caregiver, take EXACTLY as directed.  Perform blood tests EXACTLY as directed.  Quit smoking. Smoking increases your cardiac and lung (pulmonary) risks.  DO NOT drink alcohol.  DO NOT drink caffeinated drinks (e.g. coffee, soda, chocolate, and leaf teas). You may drink decaffeinated coffee, soda or tea.  If you are overweight, you should choose a reduced calorie diet to lose weight. Please see a registered dietitian if you need more information about healthy weight loss. DO  NOT USE DIET PILLS as they may aggravate heart problems.  If you have other heart problems that are causing AFib, you may need to eat a low salt, fat, and cholesterol diet. Your caregiver will tell you if this is necessary.  Exercise every day to improve your physical fitness. Stay active unless advised otherwise.  If your caregiver has given you a follow-up  appointment, it is very important to keep that appointment. Not keeping the appointment could result in heart failure or stroke. If there is any problem keeping the appointment, you must call back to this facility for assistance. SEEK MEDICAL CARE IF:  You notice a change in the rate, rhythm or strength of your heartbeat.  You develop an infection or any other change in your overall health status. SEEK IMMEDIATE MEDICAL CARE IF:   You develop chest pain, abdominal pain, sweating, weakness or feel sick to your stomach (nausea).  You develop shortness of breath.  You develop swollen feet and ankles.  You develop dizziness, numbness, or weakness of your face or limbs, or any change in vision or speech. MAKE SURE YOU:   Understand these instructions.  Will watch your condition.  Will get help right away if you are not doing well or get worse. Document Released: 02/27/2005 Document Revised: 05/22/2011 Document Reviewed: 10/06/2009 [facility] Patient Information 2014 [place], [place].

## 2012-11-07 ENCOUNTER — Telehealth: Payer: Self-pay | Admitting: *Deleted

## 2012-11-07 ENCOUNTER — Other Ambulatory Visit: Payer: Self-pay | Admitting: Internal Medicine

## 2012-11-07 DIAGNOSIS — Z515 Encounter for palliative care: Secondary | ICD-10-CM

## 2012-11-07 NOTE — Telephone Encounter (Signed)
Ambulatory referral to hospice placed. Thanks,  Vivi Barrack

## 2012-11-07 NOTE — Telephone Encounter (Signed)
Rep from hospice calls to request a referral to hospice w/ dr cater remaining as pcp, working w/ the hospice team- hospice physician in providing care to pt. , pt ph# [phone], hospice [phone], annette

## 2012-11-07 NOTE — Progress Notes (Signed)
Rep from hospice called to request a referral to hospice w/ me remaining as pcp, working w/ the hospice team - therefore placing referral.  Vivi Barrack, MD [phone]

## 2012-11-13 NOTE — Telephone Encounter (Signed)
Order called to hospice admission

## 2012-11-14 ENCOUNTER — Telehealth: Payer: Self-pay | Admitting: Internal Medicine

## 2012-11-14 ENCOUNTER — Other Ambulatory Visit: Payer: Self-pay | Admitting: Internal Medicine

## 2012-11-14 DIAGNOSIS — Z789 Other specified health status: Secondary | ICD-10-CM

## 2012-11-14 NOTE — Telephone Encounter (Signed)
Returned a page from a physician who is hospice physician.  He does not think this patient is appropriate for hospice referral and thinks he may be appropriate for [facility] referral.  [facility] referral made not sure who referred to hospice but since I am last provider who saw the patient agree with [facility] referral.   [name] Page

## 2012-11-19 ENCOUNTER — Other Ambulatory Visit: Payer: Self-pay | Admitting: *Deleted

## 2012-11-20 ENCOUNTER — Encounter: Payer: Self-pay | Admitting: Licensed Clinical Social Worker

## 2012-11-20 ENCOUNTER — Telehealth: Payer: Self-pay | Admitting: Licensed Clinical Social Worker

## 2012-11-20 MED ORDER — TRAMADOL HCL 50 MG PO TABS: 50.0000 mg | ORAL_TABLET | Freq: Four times a day (QID) | ORAL | Status: DC | PRN

## 2012-11-20 NOTE — Progress Notes (Signed)
See CSW telephone note 11/20/12

## 2012-11-20 NOTE — Telephone Encounter (Signed)
Mr. Gunkel was referred to CSW for information and referral to [facility] program.  Pt was initially referred to hospice, after evaluation hospice recommending [facility].  CSW placed call to Mrs. Rostro to follow up on current services and needs.  Currently, pt has no private duty care and spouse is primary caregiver.  No home health services were indicated by spouse, only stating she has been receiving multiple calls from home health agencies.  Spouse states Mr. Gettis has declined since last [facility] visit.  Mrs. Brooking states he has purchase an small transport wheelchair to assist pt around the house.  Pt has been unable to get in/out of tub safely.  Spouse has been placing pt in wheelchair in the bathroom and giving "chair baths".  In addition, Ms. Pfeifer states pt needs a hospital bed with rails as pt is unable to pull himself up in the bed.  Pt has been sleeping with his feet hanging off the bed.  Mrs. [facility] requesting physician order for hospital bed.  CSW informed Ms. Alicia, will forward to PCP, but pt may need to come in for an appt to discuss specific need.  Mrs. Fason states pt has been unable to leave the home and is doubtful she will be able to get him in for an appt due to no longer having private duty aides.  CSW discussed with front office staff regarding specifics with hospital bed, [facility] will notify staff.   CSW discussed providing information on [facility] if pt is unable to come in for appt.  Spouse would like listing.  [facility] services discussed and family informed pt would have a monthly fee for services but services are all-inclusive.  Mrs. Searing appreciative for information and in agreement for CSW to make referral to [facility].  CSW faxed referral form to [facility].  Family aware CSW if available to assist as needed.  CSW will sign off at this time.

## 2012-11-20 NOTE — Telephone Encounter (Signed)
Called to pharm 

## 2012-11-26 ENCOUNTER — Emergency Department (HOSPITAL_COMMUNITY): Payer: Medicare Other

## 2012-11-26 ENCOUNTER — Inpatient Hospital Stay (HOSPITAL_COMMUNITY)
Admission: EM | Admit: 2012-11-26 | Discharge: 2012-12-10 | DRG: 417 | Disposition: A | Discharge status: Skilled Nursing Facility | Payer: Medicare Other | Attending: Internal Medicine | Admitting: Internal Medicine

## 2012-11-26 ENCOUNTER — Encounter (HOSPITAL_COMMUNITY): Payer: Self-pay | Admitting: Emergency Medicine

## 2012-11-26 DIAGNOSIS — Z66 Do not resuscitate: Secondary | ICD-10-CM | POA: Clinically undetermined

## 2012-11-26 DIAGNOSIS — Z8673 Personal history of transient ischemic attack (TIA), and cerebral infarction without residual deficits: Secondary | ICD-10-CM

## 2012-11-26 DIAGNOSIS — Z993 Dependence on wheelchair: Secondary | ICD-10-CM

## 2012-11-26 DIAGNOSIS — R5381 Other malaise: Secondary | ICD-10-CM | POA: Diagnosis present

## 2012-11-26 DIAGNOSIS — D689 Coagulation defect, unspecified: Secondary | ICD-10-CM | POA: Diagnosis not present

## 2012-11-26 DIAGNOSIS — D72829 Elevated white blood cell count, unspecified: Secondary | ICD-10-CM | POA: Diagnosis present

## 2012-11-26 DIAGNOSIS — R52 Pain, unspecified: Secondary | ICD-10-CM

## 2012-11-26 DIAGNOSIS — Z9119 Patient's noncompliance with other medical treatment and regimen: Secondary | ICD-10-CM

## 2012-11-26 DIAGNOSIS — F411 Generalized anxiety disorder: Secondary | ICD-10-CM | POA: Diagnosis present

## 2012-11-26 DIAGNOSIS — N183 Chronic kidney disease, stage 3 unspecified: Secondary | ICD-10-CM | POA: Diagnosis present

## 2012-11-26 DIAGNOSIS — K922 Gastrointestinal hemorrhage, unspecified: Secondary | ICD-10-CM | POA: Diagnosis present

## 2012-11-26 DIAGNOSIS — K8020 Calculus of gallbladder without cholecystitis without obstruction: Secondary | ICD-10-CM

## 2012-11-26 DIAGNOSIS — K810 Acute cholecystitis: Secondary | ICD-10-CM | POA: Diagnosis present

## 2012-11-26 DIAGNOSIS — D62 Acute posthemorrhagic anemia: Secondary | ICD-10-CM | POA: Diagnosis not present

## 2012-11-26 DIAGNOSIS — I4891 Unspecified atrial fibrillation: Secondary | ICD-10-CM

## 2012-11-26 DIAGNOSIS — Z515 Encounter for palliative care: Secondary | ICD-10-CM

## 2012-11-26 DIAGNOSIS — R578 Other shock: Secondary | ICD-10-CM

## 2012-11-26 DIAGNOSIS — Y849 Medical procedure, unspecified as the cause of abnormal reaction of the patient, or of later complication, without mention of misadventure at the time of the procedure: Secondary | ICD-10-CM | POA: Clinically undetermined

## 2012-11-26 DIAGNOSIS — I129 Hypertensive chronic kidney disease with stage 1 through stage 4 chronic kidney disease, or unspecified chronic kidney disease: Secondary | ICD-10-CM | POA: Diagnosis present

## 2012-11-26 DIAGNOSIS — K560 Paralytic ileus: Secondary | ICD-10-CM | POA: Diagnosis not present

## 2012-11-26 DIAGNOSIS — M79609 Pain in unspecified limb: Secondary | ICD-10-CM | POA: Diagnosis present

## 2012-11-26 DIAGNOSIS — Z91199 Patient's noncompliance with other medical treatment and regimen due to unspecified reason: Secondary | ICD-10-CM

## 2012-11-26 DIAGNOSIS — R531 Weakness: Secondary | ICD-10-CM

## 2012-11-26 DIAGNOSIS — H9190 Unspecified hearing loss, unspecified ear: Secondary | ICD-10-CM | POA: Diagnosis present

## 2012-11-26 DIAGNOSIS — R109 Unspecified abdominal pain: Secondary | ICD-10-CM

## 2012-11-26 DIAGNOSIS — K8062 Calculus of gallbladder and bile duct with acute cholecystitis without obstruction: Principal | ICD-10-CM | POA: Diagnosis present

## 2012-11-26 DIAGNOSIS — K929 Disease of digestive system, unspecified: Secondary | ICD-10-CM | POA: Diagnosis not present

## 2012-11-26 DIAGNOSIS — R0600 Dyspnea, unspecified: Secondary | ICD-10-CM

## 2012-11-26 DIAGNOSIS — J9819 Other pulmonary collapse: Secondary | ICD-10-CM | POA: Diagnosis not present

## 2012-11-26 DIAGNOSIS — K219 Gastro-esophageal reflux disease without esophagitis: Secondary | ICD-10-CM | POA: Diagnosis present

## 2012-11-26 DIAGNOSIS — I10 Essential (primary) hypertension: Secondary | ICD-10-CM

## 2012-11-26 DIAGNOSIS — N179 Acute kidney failure, unspecified: Secondary | ICD-10-CM | POA: Diagnosis not present

## 2012-11-26 DIAGNOSIS — I519 Heart disease, unspecified: Secondary | ICD-10-CM | POA: Diagnosis not present

## 2012-11-26 DIAGNOSIS — H9193 Unspecified hearing loss, bilateral: Secondary | ICD-10-CM

## 2012-11-26 DIAGNOSIS — G9340 Encephalopathy, unspecified: Secondary | ICD-10-CM

## 2012-11-26 LAB — CBC WITH DIFFERENTIAL/PLATELET
Basophils Relative: 0 % (ref 0–1)
Eosinophils Absolute: 0 10*3/uL (ref 0.0–0.7)
Eosinophils Relative: 0 % (ref 0–5)
HCT: 39.6 % (ref 39.0–52.0)
Hemoglobin: 14.4 g/dL (ref 13.0–17.0)
Lymphs Abs: 0.7 10*3/uL (ref 0.7–4.0)
MCH: 35.5 pg — ABNORMAL HIGH (ref 26.0–34.0)
MCHC: 36.4 g/dL — ABNORMAL HIGH (ref 30.0–36.0)
MCV: 97.5 fL (ref 78.0–100.0)
Monocytes Absolute: 0.7 10*3/uL (ref 0.1–1.0)
Monocytes Relative: 8 % (ref 3–12)
RBC: 4.06 MIL/uL — ABNORMAL LOW (ref 4.22–5.81)

## 2012-11-26 LAB — COMPREHENSIVE METABOLIC PANEL
Alkaline Phosphatase: 116 U/L (ref 39–117)
BUN: 18 mg/dL (ref 6–23)
Calcium: 9.4 mg/dL (ref 8.4–10.5)
Creatinine, Ser: 1.28 mg/dL (ref 0.50–1.35)
GFR calc Af Amer: 57 mL/min — ABNORMAL LOW (ref >90)
Glucose, Bld: 168 mg/dL — ABNORMAL HIGH (ref 70–99)
Potassium: 3.6 mEq/L (ref 3.5–5.1)
Total Protein: 7.5 g/dL (ref 6.0–8.3)

## 2012-11-26 LAB — URINALYSIS, ROUTINE W REFLEX MICROSCOPIC
Ketones, ur: NEGATIVE mg/dL
Leukocytes, UA: NEGATIVE
Nitrite: NEGATIVE
pH: 7.5 (ref 5.0–8.0)

## 2012-11-26 LAB — LIPASE, BLOOD: Lipase: 24 U/L (ref 11–59)

## 2012-11-26 LAB — MRSA PCR SCREENING: MRSA by PCR: NEGATIVE

## 2012-11-26 MED ORDER — POTASSIUM CHLORIDE IN NACL 20-0.9 MEQ/L-% IV SOLN
INTRAVENOUS | Status: DC
  Administered 2012-11-26 – 2012-12-01 (×2): via INTRAVENOUS
  Filled 2012-11-26 (×13): qty 1000

## 2012-11-26 MED ORDER — ONDANSETRON HCL 4 MG/2ML IJ SOLN: 4.0000 mg | Freq: Three times a day (TID) | INTRAMUSCULAR | Status: DC | PRN

## 2012-11-26 MED ORDER — METOPROLOL TARTRATE 1 MG/ML IV SOLN
5.0000 mg | Freq: Once | INTRAVENOUS | Status: AC
  Administered 2012-11-26: 5 mg via INTRAVENOUS
  Filled 2012-11-26: qty 5

## 2012-11-26 MED ORDER — CIPROFLOXACIN IN D5W 400 MG/200ML IV SOLN
400.0000 mg | Freq: Once | INTRAVENOUS | Status: AC
  Administered 2012-11-26: 400 mg via INTRAVENOUS
  Filled 2012-11-26: qty 200

## 2012-11-26 MED ORDER — DILTIAZEM HCL 100 MG IV SOLR
5.0000 mg/h | INTRAVENOUS | Status: DC
  Filled 2012-11-26 (×2): qty 100

## 2012-11-26 MED ORDER — SODIUM CHLORIDE 0.9 % IV SOLN
INTRAVENOUS | Status: DC
  Administered 2012-11-26: 18:00:00 via INTRAVENOUS

## 2012-11-26 MED ORDER — METRONIDAZOLE IN NACL 5-0.79 MG/ML-% IV SOLN
500.0000 mg | Freq: Three times a day (TID) | INTRAVENOUS | Status: DC
  Administered 2012-11-26 – 2012-12-01 (×14): 500 mg via INTRAVENOUS
  Filled 2012-11-26 (×18): qty 100

## 2012-11-26 MED ORDER — DILTIAZEM HCL 100 MG IV SOLR
5.0000 mg/h | INTRAVENOUS | Status: DC
  Administered 2012-11-26 (×2): 15 mg/h via INTRAVENOUS
  Filled 2012-11-26: qty 100

## 2012-11-26 MED ORDER — LORAZEPAM 2 MG/ML IJ SOLN
0.5000 mg | Freq: Once | INTRAMUSCULAR | Status: AC
  Administered 2012-11-26: 0.5 mg via INTRAVENOUS
  Filled 2012-11-26: qty 1

## 2012-11-26 MED ORDER — SODIUM CHLORIDE 0.9 % IJ SOLN
3.0000 mL | Freq: Two times a day (BID) | INTRAMUSCULAR | Status: DC
  Administered 2012-11-27 – 2012-12-08 (×12): 3 mL via INTRAVENOUS

## 2012-11-26 MED ORDER — CIPROFLOXACIN IN D5W 400 MG/200ML IV SOLN
400.0000 mg | Freq: Two times a day (BID) | INTRAVENOUS | Status: DC
  Administered 2012-11-26 – 2012-11-30 (×9): 400 mg via INTRAVENOUS
  Filled 2012-11-26 (×14): qty 200

## 2012-11-26 MED ORDER — ONDANSETRON HCL 4 MG/2ML IJ SOLN
4.0000 mg | Freq: Four times a day (QID) | INTRAMUSCULAR | Status: DC | PRN
  Administered 2012-12-01: 4 mg via INTRAVENOUS
  Filled 2012-11-26 (×2): qty 2

## 2012-11-26 MED ORDER — HYDROMORPHONE HCL PF 1 MG/ML IJ SOLN
0.5000 mg | Freq: Once | INTRAMUSCULAR | Status: AC
  Administered 2012-11-26: 0.5 mg via INTRAVENOUS
  Filled 2012-11-26: qty 1

## 2012-11-26 MED ORDER — HYDROMORPHONE HCL PF 1 MG/ML IJ SOLN: 0.5000 mg | INTRAMUSCULAR | Status: AC | PRN

## 2012-11-26 MED ORDER — PIPERACILLIN-TAZOBACTAM 3.375 G IVPB: 3.3750 g | Freq: Once | INTRAVENOUS | Status: DC

## 2012-11-26 MED ORDER — INFLUENZA VAC SPLIT QUAD 0.5 ML IM SUSP
0.5000 mL | INTRAMUSCULAR | Status: AC
  Administered 2012-11-27: 0.5 mL via INTRAMUSCULAR
  Filled 2012-11-26 (×2): qty 0.5

## 2012-11-26 MED ORDER — HEPARIN SODIUM (PORCINE) 5000 UNIT/ML IJ SOLN
5000.0000 [IU] | Freq: Three times a day (TID) | INTRAMUSCULAR | Status: DC
  Administered 2012-11-26 – 2012-11-28 (×6): 5000 [IU] via SUBCUTANEOUS
  Filled 2012-11-26 (×9): qty 1

## 2012-11-26 MED ORDER — DILTIAZEM LOAD VIA INFUSION
10.0000 mg | Freq: Once | INTRAVENOUS | Status: AC
  Administered 2012-11-26: 10 mg via INTRAVENOUS
  Filled 2012-11-26: qty 10

## 2012-11-26 NOTE — ED Notes (Signed)
Bed: ZO10 Expected date:  Expected time:  Means of arrival:  Comments: ems-abdominal pain

## 2012-11-26 NOTE — ED Notes (Signed)
MD at bedside. 
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
## 2012-11-26 NOTE — ED Notes (Signed)
Will Jennings PA at bedside   
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
## 2012-11-26 NOTE — Progress Notes (Signed)
Family declines MyChart activation

## 2012-11-26 NOTE — Consult Note (Signed)
Pt seen and examined at 2000 Chart reviewed Imaging reviewed See PA note  Pt resting comfortably Not to talkative. Poor historian. No family in room BP 136/99  Pulse 112  Temp(Src) 100 F (37.8 C) (Oral)  Resp 23  Ht 6' (1.829 m)  Wt 159 lb 9.8 oz (72.4 kg)  BMI 21.64 kg/m2  SpO2 97% cta b/l Tachy, but rhythm ok Soft, nd, +BS. Grimaces and appears TTP in epigastrium and RUQ. No RT/guarding/peritonitis No edema No jaundice/scleral icterus  Abdominal pain Cholelithiasis, probable early acute cholecystitis  A fib H/o CVA H/o GI bleed  Agree with bowel rest tonight. Gentle IVF given A fib. Agree with antibiotics for probable early acute cholecystitis. Had ERCP with sphincterotomy in June. Hopefully can manage this with ABX alone. Will discuss with Dr Dwain Sarna in AM  Mary Sella. Andrey Campanile, MD, FACS General, Bariatric, & Minimally Invasive Surgery [facility], [place]

## 2012-11-26 NOTE — ED Notes (Signed)
Per EMS patient with Hx of ESRD from home for lower abdominal pain, patient states pain started two days ago and wife states pain only started last night. Patient is alert and oriented, per EMS, very hard of hearing. Per EMS, patient's wife administered an unknown dosage of tramadol before EMS arrival.
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
## 2012-11-26 NOTE — Progress Notes (Signed)
[facility] ED Cm noted cm consult and went to see pt but wife not at bedside [facility] present to transfer pt to [facility]

## 2012-11-26 NOTE — ED Provider Notes (Signed)
4:09 PM Accepted care from Dr. Hyacinth Meeker while awaiting EMS transport. Will give 5mg  metoprolol. Will change bed status to stepdown. I checked on pt, HR 120-130's, BP stable. Pt remains stable for transfer to [facility].   Junius Argyle, MD 11/27/12 1224

## 2012-11-27 LAB — CBC
HCT: 38.3 % — ABNORMAL LOW (ref 39.0–52.0)
Hemoglobin: 13.4 g/dL (ref 13.0–17.0)
MCH: 34.6 pg — ABNORMAL HIGH (ref 26.0–34.0)
MCHC: 35 g/dL (ref 30.0–36.0)

## 2012-11-27 LAB — BASIC METABOLIC PANEL
BUN: 19 mg/dL (ref 6–23)
Chloride: 98 mEq/L (ref 96–112)
GFR calc non Af Amer: 46 mL/min — ABNORMAL LOW (ref >90)
Glucose, Bld: 102 mg/dL — ABNORMAL HIGH (ref 70–99)
Potassium: 4 mEq/L (ref 3.5–5.1)

## 2012-11-27 LAB — TROPONIN I: Troponin I: <0.3 ng/mL (ref <0.30)

## 2012-11-27 MED ORDER — METOPROLOL TARTRATE 50 MG PO TABS
50.0000 mg | ORAL_TABLET | Freq: Two times a day (BID) | ORAL | Status: DC
  Administered 2012-11-27 – 2012-11-29 (×5): 50 mg via ORAL
  Filled 2012-11-27 (×11): qty 1

## 2012-11-27 NOTE — Progress Notes (Signed)
Report called to Amy, receiving nurse 4E. Patient and wife aware of transfer.  Patient without complaints.

## 2012-11-27 NOTE — Progress Notes (Signed)
Utilization review completed.  

## 2012-11-27 NOTE — Progress Notes (Signed)
Spoke with the patient's wife again this evening. She and her son have talked to [name] Page, and as a family they have agreed to proceed with surgery. I will make him NPO after midnight and call surgery tomorrow morning for their recommendations for proceeding.  I also talked about code status with the family. Mrs. Goedken says her husband is DNR and that they have paperwork from a lawyer at home proving it. I told her to bring a copy of this in tomorrow so that we have it on file prior to the patient going to the OR, in the event of an unexpected complication.  [name] Page [phone]

## 2012-11-27 NOTE — H&P (Signed)
  Date: 11/27/2012  Patient name: [name] Page  Medical record number: [id number]  Date of birth: [birth year]   I have seen and evaluated [name] Page and discussed their care with the Residency Team.   Assessment and Plan: I have seen and evaluated the patient as outlined above. I agree with the formulated Assessment and Plan as detailed in the residents' admission note, with the following notes:   1. Acute Cholecystitis - agree with plan for bowel rest, abx (cipro and flagyl), IVF and pain/nausea control.  Will monitor acutely for improvement in abdominal pain and advance diet as tolerated.  Surgery has been consulted, will await full recommendations.   2. Afib with RVR - likely related to missing medications due to acute hospitalization.  He will be in the SDU overnight with a diltiazem drip with plan for a quick transition to his PO rate control medications. CE X 3 planned for completion.   His other medical issues are well controlled, we are monitoring his BP closely and will be following his renal function given his history of CKD.  The medical team will also address code status with the patient and family.   Signed Inez Catalina, MD 11/27/2012  8:37 AM

## 2012-11-27 NOTE — Progress Notes (Signed)
Spoke with [name] on the phone and updated her on the surgery team's recommendations for cholecystectomy, as well as her husband's refusal/relucance to get surgery. She understands that not getting the surgery may mean he continues to have problems with the gallbladder. She also understands that surgery carries risks in older, chronically ill patients. She will stop by between 5 and 7pm to discuss the decision with her husband further.  [name] Page [phone]

## 2012-11-27 NOTE — Progress Notes (Signed)
Subjective: Patient seen at bedside. He feels well this morning and denies abdominal pain, nausea, vomiting, diarrhea, chest pain, SOB, lightheadedness. He says his problem has "gotten better." He has not had a bowel movement yet. Overnight he had an episode of hypotension (80s systolic) that resolved after decreasing his dilt gtt to 5. Pulse 110-120s on the monitor during exam.  Objective: Vital signs in last 24 hours: Filed Vitals:   11/27/12 0745 11/27/12 0746 11/27/12 0747 11/27/12 1026  BP:    117/81  Pulse: 113 125  115  Temp:   98.8 F (37.1 C)   TempSrc:   Oral   Resp: 17 22    Height:      Weight:      SpO2: 97% 97%     Weight change:   Intake/Output Summary (Last 24 hours) at 11/27/12 1114 Last data filed at 11/27/12 0800  Gross per 24 hour  Intake 1253.83 ml  Output    750 ml  Net 503.83 ml   Physical Exam  Constitutional: He is well-developed, well-nourished, and in no distress.  HENT:  Head: Normocephalic and atraumatic.  Patient is hard of hearing.  Eyes: Conjunctivae and EOM are normal. Pupils are equal, round, and reactive to light.  Neck: Normal range of motion. Neck supple.  Cardiovascular: S1 normal, S2 normal, normal heart sounds and intact distal pulses. An irregularly irregular rhythm present. Tachycardia present.  Tachycardic to 125 on the monitor, rhythm is AFib with RVR  Pulmonary/Chest: Effort normal and breath sounds normal. No respiratory distress. He has no wheezes. He has no rales. He exhibits no tenderness.  Abdominal: Soft. Bowel sounds are normal. He exhibits no distension and no mass. There is no tenderness this morning. There is no rebound and no guarding.  Musculoskeletal: Normal range of motion. He exhibits no edema and no tenderness.  Neurological: He is alert and calm. Moving all extremities. Skin: Skin is warm and dry. Psychiatric: Affect normal.   Lab Results: Basic Metabolic Panel:  Recent Labs Lab 11/26/12 0959  11/27/12 0830  NA 133* 132*  K 3.6 4.0  CL 97 98  CO2 23 24  GLUCOSE 168* 102*  BUN 18 19  CREATININE 1.28 1.34  CALCIUM 9.4 8.8   Liver Function Tests:  Recent Labs Lab 11/26/12 0959  AST 28  ALT 22  ALKPHOS 116  BILITOT 0.7  PROT 7.5  ALBUMIN 3.5    Recent Labs Lab 11/26/12 0959  LIPASE 24   CBC:  Recent Labs Lab 11/26/12 0959 11/27/12 0830  WBC 9.7 9.3  NEUTROABS 8.2*  --   HGB 14.4 13.4  HCT 39.6 38.3*  MCV 97.5 99.0  PLT 205 176   Cardiac Enzymes:  Recent Labs Lab 11/26/12 1920 11/27/12 0100 11/27/12 0830  TROPONINI <0.30 <0.30 <0.30   Coagulation:  Recent Labs Lab 11/27/12 0830  LABPROT 15.7*  INR 1.28   Urinalysis:  Recent Labs Lab 11/26/12 1022  COLORURINE YELLOW  LABSPEC 1.018  PHURINE 7.5  GLUCOSEU 100*  HGBUR NEGATIVE  BILIRUBINUR NEGATIVE  KETONESUR NEGATIVE  PROTEINUR NEGATIVE  UROBILINOGEN 1.0  NITRITE NEGATIVE  LEUKOCYTESUR NEGATIVE    Micro Results: Recent Results (from the past 240 hour(s))  MRSA PCR SCREENING     Status: None   Collection Time    11/26/12  5:13 PM      Result Value Range Status   MRSA by PCR NEGATIVE  NEGATIVE Final   Comment:  The GeneXpert MRSA Assay (FDA     approved for NASAL specimens     only), is one component of a     comprehensive MRSA colonization     surveillance program. It is not     intended to diagnose MRSA     infection nor to guide or     monitor treatment for     MRSA infections.   Studies/Results: Ct Abdomen Pelvis Wo Contrast  11/26/2012   *RADIOLOGY REPORT*  Clinical Data: Lower abdominal pain.  CT ABDOMEN AND PELVIS WITHOUT CONTRAST  Technique:  Multidetector CT imaging of the abdomen and pelvis was performed following the standard protocol without intravenous contrast.  Comparison: None.  Findings: Lung Bases:  Dependent atelectasis.  Liver:  Unenhanced CT was performed per clinician order.  Lack of IV contrast limits sensitivity and specificity,  especially for evaluation of abdominal/pelvic solid viscera.  Grossly normal.  Spleen:  Spleen appears within normal limits.  Gallbladder:  Markedly distended with innumerable tiny dependently layering gallstones.  There is mural thickening of the gallbladder wall and mild pericholecystic inflammatory changes in the fat adjacent to the hepatic flexure of the colon.  These inflammatory changes appear new when compared to the prior CT of 08/25/2012.  Common bile duct:  No common duct stone identified on today's exam.  Pancreas:  Pancreatic atrophy.  Adrenal glands:  Mild thickening of the left adrenal gland without focal mass lesion.  Kidneys:  Atrophy.  No stones.  Both ureters appear within normal limits.  Stomach:  Grossly normal.  Decompressed.  Small bowel:  Normal.  Colon:   Colonic diverticulosis is present.  Inflammatory changes are present along the ascending colon and hepatic flexure however these are favored be due to cholecystitis rather than diverticulitis or colitis.  The distal colon appears within normal limits.  Pelvic Genitourinary:  Prostate calcifications are present. Prostatomegaly.  Bones:  Ankylosis of the right SI joint. Thoracic spine DISH.  Vasculature: Atherosclerosis.  Body Wall: Fat containing periumbilical hernia.  IMPRESSION: 1.  Innumerable gallstones with mild pericholecystic inflammatory changes consistent with cholecystitis. Although the stranding extends up to the ascending colon and hepatic flexure of the gallbladder, colitis is considered unlikely based on the gallbladder disease.  The gallbladder is hydropic and there is chronic mural thickening.  The inflammatory changes around the fundus of the gallbladder are new compared to the prior examination. 2.  Retrieval of previously seen common duct stone.  No recurrent common duct stone is identified. 3.  Other chronic findings appear similar to recent prior exam.   Original Report Authenticated By: Andreas Newport, M.D.   US  Abdomen Complete  11/26/2012   *RADIOLOGY REPORT*  Clinical Data:  History of abdominal pain.  History of recent choledocholithiasis.  History of cholelithiasis.  ABDOMINAL ULTRASOUND COMPLETE  Comparison:  CT 08/25/2012.  Large 08/27/2012.  ERCP 08/29/2012.  Findings:  Gallbladder: There is cholelithiasis. Numerous small calculi are seen within the gallbladder.  There are mixed with echogenic sludge. No gallbladder wall thickening or pericholecystic fluid. The gallbladder wall thickness measured 2.7 mm. No sonographic Murphy's sign according to the ultrasound technologist.  CBD: Normal in caliber measuring 5.1 mm. No choledocholithiasis is evident.  Liver:  Normal size and echotexture without focal parenchymal abnormality.  IVC:  Patent throughout its visualized course in the abdomen.  Pancreas:  Although the pancreas is difficult to visualize in its entirety, no focal pancreatic abnormality is identified.  Spleen:  Normal size and echotexture without focal abnormality.  Length is 7.2 cm.  Right kidney:  No hydronephrosis.  Well-preserved cortex.  Normal parenchymal echotexture without focal abnormalities.  Right renal length is 9.1 cm.  Left kidney:  No hydronephrosis.  Well-preserved cortex.  Normal parenchymal echotexture. There is a tiny hypoechoic area in the lower pole left kidney consistent with a tiny cyst. The area measures 9 x 9 x 5 mm.  Left renal length is 9.1 cm.  Aorta:  Maximum diameter is 2.6 cm.  No aneurysm is evident.  Ascites:  None.  IMPRESSION:  There is cholelithiasis.  Numerous small calculi are seen within the gallbladder mixed with echogenic sludge.  No ultrasonic evidence of acute cholecystitis is demonstrated.  No choledocholithiasis  could be identified.There appears to be a tiny cyst involving the left kidney.   Original Report Authenticated By: Onalee Hua Call   Medications: I have reviewed the patient's current medications. Scheduled Meds: . ciprofloxacin  400 mg Intravenous BID  .  heparin  5,000 Units Subcutaneous Q8H  . influenza vac split quadrivalent PF  0.5 mL Intramuscular Tomorrow-1000  . metoprolol  50 mg Oral BID  . metronidazole  500 mg Intravenous Q8H  . sodium chloride  3 mL Intravenous Q12H   Continuous Infusions: . 0.9 % NaCl with KCl 20 mEq / L 75 mL/hr at 11/27/12 0800   PRN Meds:.ondansetron (ZOFRAN) IV Assessment/Plan: JAMES SENN is a 79 y.o. male PMH atrial fibrillation not on coumadin 2/2 PMH UGIB and SDH, HTN, CKD, recent hospital admission for AF with RVR and retained common bile duct stone with elevated LFTs requiring ERCP on 08/19/12, who presents as a transfer from [facility] with a chief complaint of abdominal pain, CT suggestive of cholecystitis.   #Acute cholecystitis - Patient's abdominal pain has resolved this morning. He is non-tender on exam. Dr. Dwain Sarna with surgery spoke with him this morning. He recommended lap cholecystectomy as standard treatment. He suspects the patient will continue to have problems with cholecystitis and cholelithiases until the gallbladder is removed. However the patient expressed that he did not want to undergo surgery even if it means he will continue to have issues.  - Appreciate surgery recs, will consider cardiology consult as well if the patient changes his mind about going to the OR - Will call wife to update her and get opinion on surgery vs. No surgery - Continue IV ciprofloxacin 400mg  BID and flagyl 500mg  q8h - IVF NS with KCl @75cc /hr  - Dilaudid prn pain  - Zofran prn nausea  - Advancing to CLD this morning  #Atrial fibrillation with rapid ventricular response - Patient found to be in AFib with RVR upon transfer from [facility], likely 2/2 medication noncompliance on the morning of admission. Troponins were negative x3. EKG unremarkable. We d/c'ed his diltiazem gtt this morning and restarted his home metoprolol 50mg  BID for rate control. He is in no acute distress. Denies chest pain. BP  stable presently at 117/81s. Rate 100s-120s. - Will transfer to telemetry bed - Cardiac monitoring   #DVT PPX - Subq heparin   Dispo: Disposition is deferred at this time, awaiting improvement of current medical problems.  Anticipated discharge in approximately 1-3 day(s).   The patient does have a current PCP (Vivi Barrack, MD) and does need an [facility] hospital follow-up appointment after discharge.  The patient does not have transportation limitations that hinder transportation to clinic appointments.  .Services Needed at time of discharge: Y = Yes, Blank = No PT:   OT:  RN:   Equipment:   Other:     LOS: 1 day   Vivi Barrack, MD 11/27/2012, 11:14 AM

## 2012-11-27 NOTE — Progress Notes (Signed)
Pt's SBP down to 80s.  Pt asymptomatic, alert without complaint.  Cardizem gtt titrated down to 10mg /hr.  BP trending up to 90s with reduced rate.  Dr. Aundria Rud paged and notified of above.  MD ok with BP as long as pt asymptomatic.  Will continue to monitor.   Roselie Awkward, RN

## 2012-11-27 NOTE — Progress Notes (Signed)
He is completely nontender on my evaluation today and denies any pain also.  The standard treatment (and would have been so in June) is lap chole for him.  I told him this today and discussed procedure.  He does not want to undergo surgery and appears clear.  He also understands that I think he will have continued problems like this although his stones may pass due to ercp with sphincterotomy.  We will continue to follow him but he clearly states no surgery now and does understand this is treatment for problem and possibility of further issues that could be more significant.

## 2012-11-27 NOTE — Progress Notes (Signed)
   CARE MANAGEMENT NOTE 11/27/2012  Patient:  [name] Page, [name] Page   Account Number:  [id number]  Date Initiated:  11/27/2012  Documentation initiated by:  Donn Pierini  Subjective/Objective Assessment:   Pt admitted with acute Cholecystitis     Action/Plan:   PTA pt lived at home with spouse   Anticipated DC Date:  11/30/2012   Anticipated DC Plan:        DC Planning Services  CM consult      Choice offered to / List presented to:             Status of service:  In process, will continue to follow Medicare Important Message given?   (If response is "NO", the following Medicare IM given date fields will be blank) Date Medicare IM given:   Date Additional Medicare IM given:    Discharge Disposition:    Per UR Regulation:  Reviewed for med. necessity/level of care/duration of stay  If discussed at Long Length of Stay Meetings, dates discussed:    Comments:  11/27/12- 1430 - Donn Pierini RN, BSN [phone] Referral received for hospital bed- currently wife not at bedside- pt resting- NCM to follow up when wife present- per chart wife has made arrangements with an agency ([facility]) for an aide at home- and would like a hospital bed for home- pt would need an order  in order for insurance to pay and would need to qualify for bed- otherwise they would have to pay out of pocket for bed- will explain this to wife -when she is available to speak with her- otherwise will contact her by phone 11/28/12.

## 2012-11-27 NOTE — Progress Notes (Signed)
Pt had 13 beat run of VT.  Pt asymptomatic, BP 110/82.  Currently afib 90s.  Dr. Aundria Rud paged and notified of above.  Will continue to monitor.  Roselie Awkward, RN

## 2012-11-27 NOTE — Progress Notes (Signed)
Patient ID: [name] Page, male   DOB: 27-Dec-1924, 79 y.o.   MRN: [id number]    Subjective: Pt feels ok this morning.  Denies pain currently.  Objective: Vital signs in last 24 hours: Temp:  [97.8 F (36.6 C)-100 F (37.8 C)] 98.8 F (37.1 C) (09/17 0747) Pulse Rate:  [86-148] 125 (09/17 0746) Resp:  [14-27] 22 (09/17 0746) BP: (83-170)/(60-131) 117/80 mmHg (09/17 0742) SpO2:  [92 %-99 %] 97 % (09/17 0746) Weight:  [159 lb 9.8 oz (72.4 kg)] 159 lb 9.8 oz (72.4 kg) (09/16 1713) Last BM Date: 11/26/12  Intake/Output from previous day: 09/16 0701 - 09/17 0700 In: 1173.8 [I.V.:973.8; IV Piggyback:200] Out: 550 [Urine:550] Intake/Output this shift: Total I/O In: 80 [I.V.:80] Out: 200 [Urine:200]  PE: Abd: soft, tender in RUQ with voluntary guarding, +Murphy's sign, ND, +BS Heart: irregular Lungs: CTAB  Lab Results:   Recent Labs  11/26/12 0959  WBC 9.7  HGB 14.4  HCT 39.6  PLT 205   BMET  Recent Labs  11/26/12 0959  NA 133*  K 3.6  CL 97  CO2 23  GLUCOSE 168*  BUN 18  CREATININE 1.28  CALCIUM 9.4   PT/INR No results found for this basename: LABPROT, INR,  in the last 72 hours CMP     Component Value Date/Time   NA 133* 11/26/2012 0959   K 3.6 11/26/2012 0959   CL 97 11/26/2012 0959   CO2 23 11/26/2012 0959   GLUCOSE 168* 11/26/2012 0959   BUN 18 11/26/2012 0959   CREATININE 1.28 11/26/2012 0959   CREATININE 1.80* 10/01/2012 0949   CALCIUM 9.4 11/26/2012 0959   PROT 7.5 11/26/2012 0959   ALBUMIN 3.5 11/26/2012 0959   AST 28 11/26/2012 0959   ALT 22 11/26/2012 0959   ALKPHOS 116 11/26/2012 0959   BILITOT 0.7 11/26/2012 0959   GFRNONAA 49* 11/26/2012 0959   GFRAA 57* 11/26/2012 0959   Lipase     Component Value Date/Time   LIPASE 24 11/26/2012 0959       Studies/Results: Ct Abdomen Pelvis Wo Contrast  11/26/2012   *RADIOLOGY REPORT*  Clinical Data: Lower abdominal pain.  CT ABDOMEN AND PELVIS WITHOUT CONTRAST  Technique:  Multidetector CT imaging of the  abdomen and pelvis was performed following the standard protocol without intravenous contrast.  Comparison: None.  Findings: Lung Bases:  Dependent atelectasis.  Liver:  Unenhanced CT was performed per clinician order.  Lack of IV contrast limits sensitivity and specificity, especially for evaluation of abdominal/pelvic solid viscera.  Grossly normal.  Spleen:  Spleen appears within normal limits.  Gallbladder:  Markedly distended with innumerable tiny dependently layering gallstones.  There is mural thickening of the gallbladder wall and mild pericholecystic inflammatory changes in the fat adjacent to the hepatic flexure of the colon.  These inflammatory changes appear new when compared to the prior CT of 08/25/2012.  Common bile duct:  No common duct stone identified on today's exam.  Pancreas:  Pancreatic atrophy.  Adrenal glands:  Mild thickening of the left adrenal gland without focal mass lesion.  Kidneys:  Atrophy.  No stones.  Both ureters appear within normal limits.  Stomach:  Grossly normal.  Decompressed.  Small bowel:  Normal.  Colon:   Colonic diverticulosis is present.  Inflammatory changes are present along the ascending colon and hepatic flexure however these are favored be due to cholecystitis rather than diverticulitis or colitis.  The distal colon appears within normal limits.  Pelvic Genitourinary:  Prostate calcifications  are present. Prostatomegaly.  Bones:  Ankylosis of the right SI joint. Thoracic spine DISH.  Vasculature: Atherosclerosis.  Body Wall: Fat containing periumbilical hernia.  IMPRESSION: 1.  Innumerable gallstones with mild pericholecystic inflammatory changes consistent with cholecystitis. Although the stranding extends up to the ascending colon and hepatic flexure of the gallbladder, colitis is considered unlikely based on the gallbladder disease.  The gallbladder is hydropic and there is chronic mural thickening.  The inflammatory changes around the fundus of the gallbladder  are new compared to the prior examination. 2.  Retrieval of previously seen common duct stone.  No recurrent common duct stone is identified. 3.  Other chronic findings appear similar to recent prior exam.   Original Report Authenticated By: Andreas Newport, M.D.   US Abdomen Complete  11/26/2012   *RADIOLOGY REPORT*  Clinical Data:  History of abdominal pain.  History of recent choledocholithiasis.  History of cholelithiasis.  ABDOMINAL ULTRASOUND COMPLETE  Comparison:  CT 08/25/2012.  Large 08/27/2012.  ERCP 08/29/2012.  Findings:  Gallbladder: There is cholelithiasis. Numerous small calculi are seen within the gallbladder.  There are mixed with echogenic sludge. No gallbladder wall thickening or pericholecystic fluid. The gallbladder wall thickness measured 2.7 mm. No sonographic Murphy's sign according to the ultrasound technologist.  CBD: Normal in caliber measuring 5.1 mm. No choledocholithiasis is evident.  Liver:  Normal size and echotexture without focal parenchymal abnormality.  IVC:  Patent throughout its visualized course in the abdomen.  Pancreas:  Although the pancreas is difficult to visualize in its entirety, no focal pancreatic abnormality is identified.  Spleen:  Normal size and echotexture without focal abnormality. Length is 7.2 cm.  Right kidney:  No hydronephrosis.  Well-preserved cortex.  Normal parenchymal echotexture without focal abnormalities.  Right renal length is 9.1 cm.  Left kidney:  No hydronephrosis.  Well-preserved cortex.  Normal parenchymal echotexture. There is a tiny hypoechoic area in the lower pole left kidney consistent with a tiny cyst. The area measures 9 x 9 x 5 mm.  Left renal length is 9.1 cm.  Aorta:  Maximum diameter is 2.6 cm.  No aneurysm is evident.  Ascites:  None.  IMPRESSION:  There is cholelithiasis.  Numerous small calculi are seen within the gallbladder mixed with echogenic sludge.  No ultrasonic evidence of acute cholecystitis is demonstrated.  No  choledocholithiasis  could be identified.There appears to be a tiny cyst involving the left kidney.   Original Report Authenticated By: Onalee Hua Call    Anti-infectives: Anti-infectives   Start     Dose/Rate Route Frequency Ordered Stop   11/26/12 2000  metroNIDAZOLE (FLAGYL) IVPB 500 mg     500 mg 100 mL/hr over 60 Minutes Intravenous Every 8 hours 11/26/12 1756     11/26/12 1830  ciprofloxacin (CIPRO) IVPB 400 mg     400 mg 200 mL/hr over 60 Minutes Intravenous 2 times daily 11/26/12 1756     11/26/12 1330  piperacillin-tazobactam (ZOSYN) IVPB 3.375 g  Status:  Discontinued     3.375 g 12.5 mL/hr over 240 Minutes Intravenous  Once 11/26/12 1323 11/26/12 1327   11/26/12 1330  ciprofloxacin (CIPRO) IVPB 400 mg     400 mg 200 mL/hr over 60 Minutes Intravenous  Once 11/26/12 1327 11/26/12 1620       Assessment/Plan  1. Acute cholecystitis with cholelithiasis 2. A fib with RVR  3. HTN  Plan: 1. We would recommend a cardiology consult to evaluate the patient and make sure he is ok for  surgical intervention. 2. Patient is alert and oriented, but doesn't seem to have a good understanding of this situation.  We will need to discuss with his family the situation and options of surgery vs a perc chole drain 3. The patient is chronically debilitated and surgery may be very difficult for him to tolerate; however, he is going to require some type of an intervention for his gallbladder this admission to help him feel better. 4. Cont NPO for today 5. Cont IV abx therapy. 6. Do not see any evidence from admission in June that he had a CVA.  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 7. Will follow and determine how to proceed after further discussion with family, etc.   LOS: 1 day    Devion Chriscoe E 11/27/2012, 8:26 AM Pager: [phone]

## 2012-11-28 DIAGNOSIS — I4891 Unspecified atrial fibrillation: Secondary | ICD-10-CM

## 2012-11-28 DIAGNOSIS — K810 Acute cholecystitis: Secondary | ICD-10-CM

## 2012-11-28 LAB — CBC
HCT: 40 % (ref 39.0–52.0)
Hemoglobin: 14.5 g/dL (ref 13.0–17.0)
WBC: 9.8 10*3/uL (ref 4.0–10.5)

## 2012-11-28 LAB — BASIC METABOLIC PANEL
BUN: 19 mg/dL (ref 6–23)
Chloride: 97 mEq/L (ref 96–112)
Glucose, Bld: 91 mg/dL (ref 70–99)
Potassium: 3.8 mEq/L (ref 3.5–5.1)

## 2012-11-28 MED ORDER — HEPARIN SODIUM (PORCINE) 5000 UNIT/ML IJ SOLN
5000.0000 [IU] | Freq: Three times a day (TID) | INTRAMUSCULAR | Status: DC
  Filled 2012-11-28: qty 1

## 2012-11-28 MED ORDER — DILTIAZEM LOAD VIA INFUSION
10.0000 mg | Freq: Once | INTRAVENOUS | Status: AC
  Administered 2012-11-28: 23:00:00 10 mg via INTRAVENOUS
  Filled 2012-11-28: qty 10

## 2012-11-28 MED ORDER — HEPARIN SODIUM (PORCINE) 5000 UNIT/ML IJ SOLN
5000.0000 [IU] | Freq: Three times a day (TID) | INTRAMUSCULAR | Status: AC
  Administered 2012-11-28: 5000 [IU] via SUBCUTANEOUS
  Filled 2012-11-28 (×2): qty 1

## 2012-11-28 MED ORDER — DILTIAZEM HCL 100 MG IV SOLR
5.0000 mg/h | INTRAVENOUS | Status: DC
  Administered 2012-11-28: 23:00:00 10 mg/h via INTRAVENOUS
  Administered 2012-11-29: 15 mg/h via INTRAVENOUS
  Administered 2012-11-30: 10 mg/h via INTRAVENOUS
  Filled 2012-11-28 (×5): qty 100

## 2012-11-28 NOTE — Progress Notes (Signed)
Pt's wife notified regarding signing consent for Removal of gallbladder by Dr. Dorathy Daft   Instructed pt to sign own consent and if unable to call her.  Pt A7x3.  Consent obtained.  Amanda Pea, Charity fundraiser.

## 2012-11-28 NOTE — Progress Notes (Signed)
  Date: 11/28/2012  Patient name: [name] Page  Medical record number: [id number]  Date of birth: 03-22-1924   This patient has been seen and the plan of care was discussed with the house staff. Please see their note for complete details. I concur with their findings with the following additions/corrections:  Have noted Surgery Team's recommendations.  Will consult Cardiology for evaluation of risk for surgery given his comorbidities.   Inez Catalina, MD 11/28/2012, 2:12 PM
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
## 2012-11-28 NOTE — Progress Notes (Signed)
Return call received from K. Osborne,PA and informed her that pt's wife is here.  Instructed she will be here.  Amanda Pea, Charity fundraiser.

## 2012-11-28 NOTE — Progress Notes (Signed)
S: Paged for pt's HR 120s (high as 140s), BP 151/108 starting at 8:20p. Pt asymptomatic, lying in bed awake and talkative.   Pt has history of A fib with RVR, had been on diltiazem gtt, converted back to home med of metoprolol 50 mg BID for rate control.  Scheduled for surgery tomorrow.   O:  See above  A/P: Ordered stat EKG Called on call surgeon who said ok for patient to be on diltiazem gtt during surgery but primary team should touch base with surgery in AM to determine if pt should have cholecystectomy vs. percutaneous tube given a fib rate control issues.  Also touch base with cardiology to confirm rate control agent.   Called pharmacy- diltiazem bolus of 10 mg, infusion at 5-15 mg/hr (asked nurse to start at 10 mg/hr given pt's low BP on 15 mg/hr earlier this week).

## 2012-11-28 NOTE — Progress Notes (Signed)
I had a long talk with husband and wife.  I do think he needs a cholecystectomy but this isn't without risk.  Without surgery he will either need drain or continue having same issues.  Risks include but not limited to bleeding, infection, open procedure (which I told them up to 20%), pna, mechanical ventilation, MI, arrythmias and even death. He and his wife both understand this.  I would like cardiology to see him and then will likely plan surgery tomorrow

## 2012-11-28 NOTE — Progress Notes (Signed)
Spoke with surgery PA, Barnetta Chapel, after family meeting. The family has decided to proceed with surgery. Plan is for OR tomorrow. I will make him NPO after midnight.  Dr. Dwain Sarna would like cardiology to weigh in regarding pre-op recommendations, given the patient's AFib with RVR. They have been consulted.  Vivi Barrack [phone]

## 2012-11-28 NOTE — Progress Notes (Signed)
Pt placed back on tel monitor as ordered by Dr. [place].  Natalie MT in central monitor informed.   Verified pt back on tele.  Will continue to monitor.  Amanda Pea, Charity fundraiser.

## 2012-11-28 NOTE — Progress Notes (Signed)
Pt wife at bedside and instructed her that K. Earl Gala, [place] had asked to page her when she get here and will do so.  Call place to answering service and instructed they will page PA.  Awaiting return call.  Amanda Pea, Charity fundraiser.

## 2012-11-28 NOTE — Progress Notes (Signed)
Patient ID: [name] Page, male   DOB: 17-Jan-1925, 79 y.o.   MRN: [id number]    Subjective: Pt with no pain this morning.  He thinks his decision for surgery is up to [place] as his providers.  Objective: Vital signs in last 24 hours: Temp:  [97.4 F (36.3 C)-98 F (36.7 C)] 97.4 F (36.3 C) (09/18 0423) Pulse Rate:  [85-115] 112 (09/18 0423) Resp:  [16-28] 18 (09/18 0423) BP: (96-163)/(63-97) 118/81 mmHg (09/18 0423) SpO2:  [94 %-98 %] 98 % (09/18 0423) Weight:  [158 lb 4.6 oz (71.8 kg)-161 lb 2.5 oz (73.1 kg)] 158 lb 4.6 oz (71.8 kg) (09/18 0423) Last BM Date: 11/26/12  Intake/Output from previous day: 09/17 0701 - 09/18 0700 In: 845 [P.O.:240; I.V.:605] Out: 1750 [Urine:1750] Intake/Output this shift:    PE: Abd: soft, NT, ND, +BS  Lab Results:   Recent Labs  11/27/12 0830 11/28/12 0555  WBC 9.3 9.8  HGB 13.4 14.5  HCT 38.3* 40.0  PLT 176 193   BMET  Recent Labs  11/27/12 0830 11/28/12 0555  NA 132* 130*  K 4.0 3.8  CL 98 97  CO2 24 23  GLUCOSE 102* 91  BUN 19 19  CREATININE 1.34 1.26  CALCIUM 8.8 8.9   PT/INR  Recent Labs  11/27/12 0830  LABPROT 15.7*  INR 1.28   CMP     Component Value Date/Time   NA 130* 11/28/2012 0555   K 3.8 11/28/2012 0555   CL 97 11/28/2012 0555   CO2 23 11/28/2012 0555   GLUCOSE 91 11/28/2012 0555   BUN 19 11/28/2012 0555   CREATININE 1.26 11/28/2012 0555   CREATININE 1.80* 10/01/2012 0949   CALCIUM 8.9 11/28/2012 0555   PROT 7.5 11/26/2012 0959   ALBUMIN 3.5 11/26/2012 0959   AST 28 11/26/2012 0959   ALT 22 11/26/2012 0959   ALKPHOS 116 11/26/2012 0959   BILITOT 0.7 11/26/2012 0959   GFRNONAA 50* 11/28/2012 0555   GFRAA 58* 11/28/2012 0555   Lipase     Component Value Date/Time   LIPASE 24 11/26/2012 0959       Studies/Results: Ct Abdomen Pelvis Wo Contrast  11/26/2012   *RADIOLOGY REPORT*  Clinical Data: Lower abdominal pain.  CT ABDOMEN AND PELVIS WITHOUT CONTRAST  Technique:  Multidetector CT imaging of the  abdomen and pelvis was performed following the standard protocol without intravenous contrast.  Comparison: None.  Findings: Lung Bases:  Dependent atelectasis.  Liver:  Unenhanced CT was performed per clinician order.  Lack of IV contrast limits sensitivity and specificity, especially for evaluation of abdominal/pelvic solid viscera.  Grossly normal.  Spleen:  Spleen appears within normal limits.  Gallbladder:  Markedly distended with innumerable tiny dependently layering gallstones.  There is mural thickening of the gallbladder wall and mild pericholecystic inflammatory changes in the fat adjacent to the hepatic flexure of the colon.  These inflammatory changes appear new when compared to the prior CT of 08/25/2012.  Common bile duct:  No common duct stone identified on today's exam.  Pancreas:  Pancreatic atrophy.  Adrenal glands:  Mild thickening of the left adrenal gland without focal mass lesion.  Kidneys:  Atrophy.  No stones.  Both ureters appear within normal limits.  Stomach:  Grossly normal.  Decompressed.  Small bowel:  Normal.  Colon:   Colonic diverticulosis is present.  Inflammatory changes are present along the ascending colon and hepatic flexure however these are favored be due to cholecystitis rather than diverticulitis or colitis.  The distal colon appears within normal limits.  Pelvic Genitourinary:  Prostate calcifications are present. Prostatomegaly.  Bones:  Ankylosis of the right SI joint. Thoracic spine DISH.  Vasculature: Atherosclerosis.  Body Wall: Fat containing periumbilical hernia.  IMPRESSION: 1.  Innumerable gallstones with mild pericholecystic inflammatory changes consistent with cholecystitis. Although the stranding extends up to the ascending colon and hepatic flexure of the gallbladder, colitis is considered unlikely based on the gallbladder disease.  The gallbladder is hydropic and there is chronic mural thickening.  The inflammatory changes around the fundus of the gallbladder  are new compared to the prior examination. 2.  Retrieval of previously seen common duct stone.  No recurrent common duct stone is identified. 3.  Other chronic findings appear similar to recent prior exam.   Original Report Authenticated By: Andreas Newport, M.D.   US Abdomen Complete  11/26/2012   *RADIOLOGY REPORT*  Clinical Data:  History of abdominal pain.  History of recent choledocholithiasis.  History of cholelithiasis.  ABDOMINAL ULTRASOUND COMPLETE  Comparison:  CT 08/25/2012.  Large 08/27/2012.  ERCP 08/29/2012.  Findings:  Gallbladder: There is cholelithiasis. Numerous small calculi are seen within the gallbladder.  There are mixed with echogenic sludge. No gallbladder wall thickening or pericholecystic fluid. The gallbladder wall thickness measured 2.7 mm. No sonographic Murphy's sign according to the ultrasound technologist.  CBD: Normal in caliber measuring 5.1 mm. No choledocholithiasis is evident.  Liver:  Normal size and echotexture without focal parenchymal abnormality.  IVC:  Patent throughout its visualized course in the abdomen.  Pancreas:  Although the pancreas is difficult to visualize in its entirety, no focal pancreatic abnormality is identified.  Spleen:  Normal size and echotexture without focal abnormality. Length is 7.2 cm.  Right kidney:  No hydronephrosis.  Well-preserved cortex.  Normal parenchymal echotexture without focal abnormalities.  Right renal length is 9.1 cm.  Left kidney:  No hydronephrosis.  Well-preserved cortex.  Normal parenchymal echotexture. There is a tiny hypoechoic area in the lower pole left kidney consistent with a tiny cyst. The area measures 9 x 9 x 5 mm.  Left renal length is 9.1 cm.  Aorta:  Maximum diameter is 2.6 cm.  No aneurysm is evident.  Ascites:  None.  IMPRESSION:  There is cholelithiasis.  Numerous small calculi are seen within the gallbladder mixed with echogenic sludge.  No ultrasonic evidence of acute cholecystitis is demonstrated.  No  choledocholithiasis  could be identified.There appears to be a tiny cyst involving the left kidney.   Original Report Authenticated By: Onalee Hua Call    Anti-infectives: Anti-infectives   Start     Dose/Rate Route Frequency Ordered Stop   11/26/12 2000  metroNIDAZOLE (FLAGYL) IVPB 500 mg     500 mg 100 mL/hr over 60 Minutes Intravenous Every 8 hours 11/26/12 1756     11/26/12 1830  ciprofloxacin (CIPRO) IVPB 400 mg     400 mg 200 mL/hr over 60 Minutes Intravenous 2 times daily 11/26/12 1756     11/26/12 1330  piperacillin-tazobactam (ZOSYN) IVPB 3.375 g  Status:  Discontinued     3.375 g 12.5 mL/hr over 240 Minutes Intravenous  Once 11/26/12 1323 11/26/12 1327   11/26/12 1330  ciprofloxacin (CIPRO) IVPB 400 mg     400 mg 200 mL/hr over 60 Minutes Intravenous  Once 11/26/12 1327 11/26/12 1620       Assessment/Plan  1. Acute cholecystitis Patient Active Problem List   Diagnosis Date Noted  . Atrial fibrillation with RVR 11/26/2012  .  Cholecystitis, acute 11/26/2012  . Right shoulder pain 10/01/2012  . Neck pain 10/01/2012  . Pain of right lower leg 10/01/2012  . Increased urinary frequency 10/01/2012  . Hearing impairment 08/30/2012  . Choledocholithiasis with acute cholecystitis with obstruction 08/30/2012  . Slurred speech 08/24/2012  . Abnormality of gait 06/18/2012  . CKD (chronic kidney disease), stage III   . Dermatitis 05/12/2011  . Knee pain, right 05/12/2011  . CVA (cerebral infarction) 07/26/2010  . Atrial fibrillation 12/30/2009  . ESSENTIAL HYPERTENSION 09/20/2006  . GERD 06/15/2006   Plan: 1. The wife is going to come in today.  We need to have a d/w her and the patient to thoroughly explain this situation.  The patient has multiple other co-morbidities, but also is essentially house bound it sounds like from all the notes in epic.  He is at risk for complications and a significant effect on his overall quality of life.  Surgery is still something we recommend as  I think he is going to continue having problems with his gallbladder if he doesn't have it removed, but he and his wife need to be fully aware of what they may face post operatively.  May have clears for now.  LOS: 2 days    Maximilian Tallo E 11/28/2012, 8:46 AM Pager: [phone]

## 2012-11-28 NOTE — Progress Notes (Signed)
Patient evaluated for community based chronic disease management services with [facility] Care Management Program as a benefit of patient's [facility] Medicare Insurance.  Patient has been engaged by [facility] Liaison with [facility] for disease management [phone]). Made inpatient Case Manager aware that [facility] Care Management is following. Of note, [facility] Care Management services does not replace or interfere with any services that are arranged by inpatient case management or social work.  For additional questions or referrals please contact Anibal Henderson BSN RN [facility] Liaison at [phone].

## 2012-11-28 NOTE — Progress Notes (Signed)
Subjective: Patient seen at bedside. Last night he was transferred from the step down unit to a telemetry bed. He feels well and denies abdominal pain, nausea, vomiting, diarrhea, chest pain, SOB, lightheadedness. He said his legs sometimes bother him when he moves them, and points to his right calf. When I asked whether he had decided about surgery, he shrugged and said "whatever my wife wants."  Objective: Vital signs in last 24 hours: Filed Vitals:   11/27/12 1540 11/27/12 2005 11/28/12 0423 11/28/12 0926  BP: 163/86 137/97 118/81 125/78  Pulse:  101 112 104  Temp: 97.6 F (36.4 C) 98 F (36.7 C) 97.4 F (36.3 C)   TempSrc: Oral Oral Oral   Resp:  18 18   Height: 6\' 1"  (1.854 m)     Weight: 161 lb 2.5 oz (73.1 kg)  158 lb 4.6 oz (71.8 kg)   SpO2: 98% 96% 98%    Weight change: 1 lb 8.7 oz (0.7 kg)  Intake/Output Summary (Last 24 hours) at 11/28/12 1101 Last data filed at 11/28/12 0849  Gross per 24 hour  Intake    540 ml  Output   1550 ml  Net  -1010 ml   Physical Exam  Constitutional: He is well-developed, well-nourished, and in no distress.  HENT:  Head: Normocephalic and atraumatic.  Patient is hard of hearing.  Eyes: Conjunctivae and EOM are normal. Pupils are equal, round, and reactive to light.  Neck: Normal range of motion. Neck supple.  Cardiovascular: S1 normal, S2 normal, normal heart sounds and intact distal pulses. An irregularly irregular rhythm present. Tachycardia present.  Pulmonary/Chest: Effort normal and breath sounds normal. No respiratory distress. He has no wheezes. He has no rales. He exhibits no tenderness.  Abdominal: Soft. Bowel sounds are normal. He exhibits no distension and no mass. There is no tenderness this morning. There is no rebound and no guarding.  Musculoskeletal: Normal range of motion. No calf tenderness. No leg erythema, swelling. Neurological: He is alert and calm. Moving all extremities. Skin: Skin is warm and dry. Psychiatric:  Affect normal.   Lab Results: Basic Metabolic Panel:  Recent Labs Lab 11/27/12 0830 11/28/12 0555  NA 132* 130*  K 4.0 3.8  CL 98 97  CO2 24 23  GLUCOSE 102* 91  BUN 19 19  CREATININE 1.34 1.26  CALCIUM 8.8 8.9   Liver Function Tests:  Recent Labs Lab 11/26/12 0959  AST 28  ALT 22  ALKPHOS 116  BILITOT 0.7  PROT 7.5  ALBUMIN 3.5    Recent Labs Lab 11/26/12 0959  LIPASE 24   CBC:  Recent Labs Lab 11/26/12 0959 11/27/12 0830 11/28/12 0555  WBC 9.7 9.3 9.8  NEUTROABS 8.2*  --   --   HGB 14.4 13.4 14.5  HCT 39.6 38.3* 40.0  MCV 97.5 99.0 98.0  PLT 205 176 193   Cardiac Enzymes:  Recent Labs Lab 11/26/12 1920 11/27/12 0100 11/27/12 0830  TROPONINI <0.30 <0.30 <0.30   Coagulation:  Recent Labs Lab 11/27/12 0830  LABPROT 15.7*  INR 1.28   Urinalysis:  Recent Labs Lab 11/26/12 1022  COLORURINE YELLOW  LABSPEC 1.018  PHURINE 7.5  GLUCOSEU 100*  HGBUR NEGATIVE  BILIRUBINUR NEGATIVE  KETONESUR NEGATIVE  PROTEINUR NEGATIVE  UROBILINOGEN 1.0  NITRITE NEGATIVE  LEUKOCYTESUR NEGATIVE    Micro Results: Recent Results (from the past 240 hour(s))  MRSA PCR SCREENING     Status: None   Collection Time    11/26/12  5:13  PM      Result Value Range Status   MRSA by PCR NEGATIVE  NEGATIVE Final   Comment:            The GeneXpert MRSA Assay (FDA     approved for NASAL specimens     only), is one component of a     comprehensive MRSA colonization     surveillance program. It is not     intended to diagnose MRSA     infection nor to guide or     monitor treatment for     MRSA infections.   Studies/Results: Ct Abdomen Pelvis Wo Contrast  11/26/2012   *RADIOLOGY REPORT*  Clinical Data: Lower abdominal pain.  CT ABDOMEN AND PELVIS WITHOUT CONTRAST  Technique:  Multidetector CT imaging of the abdomen and pelvis was performed following the standard protocol without intravenous contrast.  Comparison: None.  Findings: Lung Bases:  Dependent  atelectasis.  Liver:  Unenhanced CT was performed per clinician order.  Lack of IV contrast limits sensitivity and specificity, especially for evaluation of abdominal/pelvic solid viscera.  Grossly normal.  Spleen:  Spleen appears within normal limits.  Gallbladder:  Markedly distended with innumerable tiny dependently layering gallstones.  There is mural thickening of the gallbladder wall and mild pericholecystic inflammatory changes in the fat adjacent to the hepatic flexure of the colon.  These inflammatory changes appear new when compared to the prior CT of 08/25/2012.  Common bile duct:  No common duct stone identified on today's exam.  Pancreas:  Pancreatic atrophy.  Adrenal glands:  Mild thickening of the left adrenal gland without focal mass lesion.  Kidneys:  Atrophy.  No stones.  Both ureters appear within normal limits.  Stomach:  Grossly normal.  Decompressed.  Small bowel:  Normal.  Colon:   Colonic diverticulosis is present.  Inflammatory changes are present along the ascending colon and hepatic flexure however these are favored be due to cholecystitis rather than diverticulitis or colitis.  The distal colon appears within normal limits.  Pelvic Genitourinary:  Prostate calcifications are present. Prostatomegaly.  Bones:  Ankylosis of the right SI joint. Thoracic spine DISH.  Vasculature: Atherosclerosis.  Body Wall: Fat containing periumbilical hernia.  IMPRESSION: 1.  Innumerable gallstones with mild pericholecystic inflammatory changes consistent with cholecystitis. Although the stranding extends up to the ascending colon and hepatic flexure of the gallbladder, colitis is considered unlikely based on the gallbladder disease.  The gallbladder is hydropic and there is chronic mural thickening.  The inflammatory changes around the fundus of the gallbladder are new compared to the prior examination. 2.  Retrieval of previously seen common duct stone.  No recurrent common duct stone is identified. 3.   Other chronic findings appear similar to recent prior exam.   Original Report Authenticated By: Andreas Newport, M.D.   Medications: I have reviewed the patient's current medications. Scheduled Meds: . ciprofloxacin  400 mg Intravenous BID  . heparin  5,000 Units Subcutaneous Q8H  . metoprolol  50 mg Oral BID  . metronidazole  500 mg Intravenous Q8H  . sodium chloride  3 mL Intravenous Q12H   Continuous Infusions: . 0.9 % NaCl with KCl 20 mEq / L 75 mL/hr at 11/27/12 0800   PRN Meds:.ondansetron (ZOFRAN) IV  Assessment/Plan: [name] Page is a 79 y.o. male PMH atrial fibrillation not on coumadin 2/2 PMH UGIB and SDH, HTN, CKD, recent hospital admission for AF with RVR and retained common bile duct stone with elevated LFTs requiring ERCP  on 08/19/12, who presents as a transfer from [facility] with a chief complaint of abdominal pain, CT suggestive of cholecystitis.   #Acute cholecystitis - Patient's abdominal pain has resolved. The family spoke with him yesterday and told me they had convinced him to proceed with surgery. However, the patient himself appears lackadaisical about the decision this morning. - Appreciate surgery recs, PA Barnetta Chapel will meet with the patient and his family later today to go over risks and benefits of surgery in detail so that they can make an informed decision. I called his wife and she will come by in a few hours (PA needs meeting to be before 4pm.) Nurse will page her when family is in the room. - Continue IV ciprofloxacin 400mg  BID and flagyl 500mg  q8h - IVF NS with KCl @75cc /hr  - Dilaudid prn pain  - Zofran prn nausea  - Advancing back to CLD this morning as he will not go to OR today  #Atrial fibrillation with rapid ventricular response - Patient found to be in AFib with RVR upon transfer from [facility], likely 2/2 medication noncompliance on the morning of admission. Troponins were negative x3. EKG not concerning for ischemia. We d/c'ed his  diltiazem gtt yesterday and restarted his home metoprolol 50mg  BID for rate control. Transferred to telemetry bed. He is in no acute distress presently. Denies chest pain. BP stable at 118/81. Rate 100s-110s. - Will d/c cardiac monitoring - Q4h vital signs  #Leg pain - Unlikely DVT as patient is on heparin prophylaxis, has no calf tenderness, no unilateral swelling or erythema. Likely a muscle cramp. K is wnl this morning. - Will encourage stretching and continue to monitor  #DVT PPX - Subq heparin   Dispo: Disposition is deferred at this time, awaiting improvement of current medical problems.  Anticipated discharge in approximately 1-3 day(s).   The patient does have a current PCP (Vivi Barrack, MD) and does need an [facility] hospital follow-up appointment after discharge.  The patient does not have transportation limitations that hinder transportation to clinic appointments.  .Services Needed at time of discharge: Y = Yes, Blank = No PT:   OT:   RN:   Equipment:   Other:     LOS: 2 days   Vivi Barrack, MD 11/28/2012, 11:01 AM

## 2012-11-28 NOTE — Progress Notes (Signed)
Pt heart rate up to 140 and nonsustained.  Pt asymptomatic.  BP 125/78, P 104.  Will continue to monitor.  Amanda Pea, Charity fundraiser.

## 2012-11-28 NOTE — Progress Notes (Signed)
Dr. Celene Skeen notified pt had nonsustained HR  Of 140. And asymptomatic.  No new order will continue to monitor.  Amanda Pea, Charity fundraiser.

## 2012-11-29 ENCOUNTER — Encounter (HOSPITAL_COMMUNITY)
Admission: EM | Disposition: A | Discharge status: Skilled Nursing Facility | Payer: Medicare Other | Source: Home / Self Care | Attending: Internal Medicine

## 2012-11-29 ENCOUNTER — Inpatient Hospital Stay (HOSPITAL_COMMUNITY): Payer: Medicare Other | Admitting: Anesthesiology

## 2012-11-29 ENCOUNTER — Encounter (HOSPITAL_COMMUNITY): Payer: Self-pay | Admitting: Certified Registered"

## 2012-11-29 ENCOUNTER — Encounter (HOSPITAL_COMMUNITY): Payer: Self-pay | Admitting: Anesthesiology

## 2012-11-29 DIAGNOSIS — K8000 Calculus of gallbladder with acute cholecystitis without obstruction: Secondary | ICD-10-CM

## 2012-11-29 DIAGNOSIS — I10 Essential (primary) hypertension: Secondary | ICD-10-CM

## 2012-11-29 HISTORY — PX: CHOLECYSTECTOMY: SHX55

## 2012-11-29 LAB — BASIC METABOLIC PANEL
BUN: 18 mg/dL (ref 6–23)
Creatinine, Ser: 1.17 mg/dL (ref 0.50–1.35)
GFR calc Af Amer: 63 mL/min — ABNORMAL LOW (ref >90)
GFR calc non Af Amer: 55 mL/min — ABNORMAL LOW (ref >90)
Potassium: 4 mEq/L (ref 3.5–5.1)

## 2012-11-29 LAB — CBC
HCT: 38.2 % — ABNORMAL LOW (ref 39.0–52.0)
MCHC: 35.9 g/dL (ref 30.0–36.0)
MCV: 97.4 fL (ref 78.0–100.0)
RDW: 11.8 % (ref 11.5–15.5)

## 2012-11-29 SURGERY — LAPAROSCOPIC CHOLECYSTECTOMY
Anesthesia: General | Site: Abdomen | Wound class: Dirty or Infected

## 2012-11-29 MED ORDER — GLYCOPYRROLATE 0.2 MG/ML IJ SOLN
INTRAMUSCULAR | Status: DC | PRN
  Administered 2012-11-29: 0.6 mg via INTRAVENOUS

## 2012-11-29 MED ORDER — HEPARIN SODIUM (PORCINE) 5000 UNIT/ML IJ SOLN
5000.0000 [IU] | Freq: Three times a day (TID) | INTRAMUSCULAR | Status: DC
  Administered 2012-11-30 – 2012-12-03 (×11): 5000 [IU] via SUBCUTANEOUS
  Filled 2012-11-29 (×15): qty 1

## 2012-11-29 MED ORDER — ONDANSETRON HCL 4 MG/2ML IJ SOLN
INTRAMUSCULAR | Status: DC | PRN
  Administered 2012-11-29: 4 mg via INTRAVENOUS

## 2012-11-29 MED ORDER — FENTANYL CITRATE 0.05 MG/ML IJ SOLN
INTRAMUSCULAR | Status: AC
  Filled 2012-11-29: qty 2

## 2012-11-29 MED ORDER — SODIUM CHLORIDE 0.9 % IR SOLN
Status: DC | PRN
  Administered 2012-11-29 (×2): 1

## 2012-11-29 MED ORDER — BUPIVACAINE-EPINEPHRINE 0.25% -1:200000 IJ SOLN
INTRAMUSCULAR | Status: DC | PRN
  Administered 2012-11-29: 30 mL

## 2012-11-29 MED ORDER — SODIUM CHLORIDE 0.9 % IV SOLN: INTRAVENOUS | Status: DC | PRN

## 2012-11-29 MED ORDER — FENTANYL CITRATE 0.05 MG/ML IJ SOLN
INTRAMUSCULAR | Status: DC | PRN
  Administered 2012-11-29: 50 ug via INTRAVENOUS
  Administered 2012-11-29: 100 ug via INTRAVENOUS

## 2012-11-29 MED ORDER — ROCURONIUM BROMIDE 100 MG/10ML IV SOLN
INTRAVENOUS | Status: DC | PRN
  Administered 2012-11-29: 10 mg via INTRAVENOUS
  Administered 2012-11-29: 30 mg via INTRAVENOUS

## 2012-11-29 MED ORDER — 0.9 % SODIUM CHLORIDE (POUR BTL) OPTIME
TOPICAL | Status: DC | PRN
  Administered 2012-11-29: 1000 mL

## 2012-11-29 MED ORDER — NEOSTIGMINE METHYLSULFATE 1 MG/ML IJ SOLN
INTRAMUSCULAR | Status: DC | PRN
  Administered 2012-11-29: 5 mg via INTRAVENOUS

## 2012-11-29 MED ORDER — PROPOFOL 10 MG/ML IV BOLUS
INTRAVENOUS | Status: DC | PRN
  Administered 2012-11-29: 100 mg via INTRAVENOUS

## 2012-11-29 MED ORDER — MORPHINE SULFATE 2 MG/ML IJ SOLN
2.0000 mg | INTRAMUSCULAR | Status: DC | PRN
Start: 2012-11-29 — End: 2012-12-01
  Administered 2012-11-29 (×3): 2 mg via INTRAVENOUS
  Filled 2012-11-29 (×3): qty 1

## 2012-11-29 MED ORDER — PHENYLEPHRINE HCL 10 MG/ML IJ SOLN
INTRAMUSCULAR | Status: DC | PRN
  Administered 2012-11-29 (×4): 80 ug via INTRAVENOUS

## 2012-11-29 MED ORDER — LIDOCAINE HCL (CARDIAC) 20 MG/ML IV SOLN
INTRAVENOUS | Status: DC | PRN
  Administered 2012-11-29: 70 mg via INTRAVENOUS

## 2012-11-29 MED ORDER — METOPROLOL TARTRATE 1 MG/ML IV SOLN
2.5000 mg | INTRAVENOUS | Status: DC | PRN
  Administered 2012-11-29: 2.5 mg via INTRAVENOUS
  Filled 2012-11-29: qty 5

## 2012-11-29 MED ORDER — BUPIVACAINE-EPINEPHRINE PF 0.25-1:200000 % IJ SOLN
INTRAMUSCULAR | Status: AC
  Filled 2012-11-29: qty 30

## 2012-11-29 MED ORDER — LACTATED RINGERS IV SOLN
INTRAVENOUS | Status: DC | PRN
  Administered 2012-11-29 (×2): via INTRAVENOUS

## 2012-11-29 MED ORDER — HEMOSTATIC AGENTS (NO CHARGE) OPTIME
TOPICAL | Status: DC | PRN
  Administered 2012-11-29: 1

## 2012-11-29 MED ORDER — FENTANYL CITRATE 0.05 MG/ML IJ SOLN
25.0000 ug | INTRAMUSCULAR | Status: DC | PRN
  Administered 2012-11-29: 50 ug via INTRAVENOUS

## 2012-11-29 MED ORDER — HYDROCODONE-ACETAMINOPHEN 5-325 MG PO TABS
1.0000 | ORAL_TABLET | ORAL | Status: DC | PRN
  Administered 2012-11-30 – 2012-12-01 (×4): 1 via ORAL
  Filled 2012-11-29 (×4): qty 1

## 2012-11-29 SURGICAL SUPPLY — 46 items
APPLIER CLIP 5 13 M/L LIGAMAX5 (MISCELLANEOUS) ×2
BLADE SURG 10 STRL SS (BLADE) ×2 IMPLANT
BLADE SURG ROTATE 9660 (MISCELLANEOUS) ×2 IMPLANT
CANISTER SUCTION 2500CC (MISCELLANEOUS) ×2 IMPLANT
CHLORAPREP W/TINT 26ML (MISCELLANEOUS) ×2 IMPLANT
CLIP APPLIE 5 13 M/L LIGAMAX5 (MISCELLANEOUS) ×1 IMPLANT
CLOTH BEACON ORANGE TIMEOUT ST (SAFETY) ×2 IMPLANT
COVER MAYO STAND STRL (DRAPES) ×2 IMPLANT
COVER SURGICAL LIGHT HANDLE (MISCELLANEOUS) ×2 IMPLANT
DECANTER SPIKE VIAL GLASS SM (MISCELLANEOUS) IMPLANT
DERMABOND ADVANCED (GAUZE/BANDAGES/DRESSINGS) ×1
DERMABOND ADVANCED .7 DNX12 (GAUZE/BANDAGES/DRESSINGS) ×1 IMPLANT
DRAPE C-ARM 42X72 X-RAY (DRAPES) IMPLANT
ELECT REM PT RETURN 9FT ADLT (ELECTROSURGICAL) ×2
ELECTRODE REM PT RTRN 9FT ADLT (ELECTROSURGICAL) ×1 IMPLANT
GLOVE BIO SURGEON STRL SZ7 (GLOVE) ×2 IMPLANT
GLOVE BIOGEL PI IND STRL 6.5 (GLOVE) ×1 IMPLANT
GLOVE BIOGEL PI IND STRL 7.0 (GLOVE) ×1 IMPLANT
GLOVE BIOGEL PI IND STRL 7.5 (GLOVE) ×1 IMPLANT
GLOVE BIOGEL PI INDICATOR 6.5 (GLOVE) ×1
GLOVE BIOGEL PI INDICATOR 7.0 (GLOVE) ×1
GLOVE BIOGEL PI INDICATOR 7.5 (GLOVE) ×1
GLOVE SURG SS PI 6.5 STRL IVOR (GLOVE) ×2 IMPLANT
GLOVE SURG SS PI 7.0 STRL IVOR (GLOVE) ×2 IMPLANT
GOWN STRL NON-REIN LRG LVL3 (GOWN DISPOSABLE) ×8 IMPLANT
HEMOSTAT SNOW SURGICEL 2X4 (HEMOSTASIS) ×2 IMPLANT
KIT BASIN OR (CUSTOM PROCEDURE TRAY) ×2 IMPLANT
KIT ROOM TURNOVER OR (KITS) ×2 IMPLANT
NS IRRIG 1000ML POUR BTL (IV SOLUTION) ×2 IMPLANT
PAD ARMBOARD 7.5X6 YLW CONV (MISCELLANEOUS) ×4 IMPLANT
POUCH SPECIMEN RETRIEVAL 10MM (ENDOMECHANICALS) ×2 IMPLANT
SCISSORS LAP 5X35 DISP (ENDOMECHANICALS) IMPLANT
SET CHOLANGIOGRAPH 5 50 .035 (SET/KITS/TRAYS/PACK) IMPLANT
SET IRRIG TUBING LAPAROSCOPIC (IRRIGATION / IRRIGATOR) ×2 IMPLANT
SLEEVE ENDOPATH XCEL 5M (ENDOMECHANICALS) ×4 IMPLANT
SPECIMEN JAR MEDIUM (MISCELLANEOUS) ×2 IMPLANT
SPECIMEN JAR SMALL (MISCELLANEOUS) IMPLANT
STRIP CLOSURE SKIN 1/2X4 (GAUZE/BANDAGES/DRESSINGS) ×2 IMPLANT
SUT MNCRL AB 4-0 PS2 18 (SUTURE) ×2 IMPLANT
SUT VICRYL 0 UR6 27IN ABS (SUTURE) ×2 IMPLANT
TOWEL OR 17X24 6PK STRL BLUE (TOWEL DISPOSABLE) ×2 IMPLANT
TOWEL OR 17X26 10 PK STRL BLUE (TOWEL DISPOSABLE) ×2 IMPLANT
TRAY LAPAROSCOPIC (CUSTOM PROCEDURE TRAY) ×2 IMPLANT
TROCAR XCEL BLUNT TIP 100MML (ENDOMECHANICALS) ×2 IMPLANT
TROCAR XCEL NON-BLD 11X100MML (ENDOMECHANICALS) ×2 IMPLANT
TROCAR XCEL NON-BLD 5MMX100MML (ENDOMECHANICALS) ×2 IMPLANT

## 2012-11-29 NOTE — Transfer of Care (Signed)
Immediate Anesthesia Transfer of Care Note  Patient: [name] Page  Procedure(s) Performed: Procedure(s): LAPAROSCOPIC CHOLECYSTECTOMY (N/A)  Patient Location: PACU  Anesthesia Type:General  Level of Consciousness: sedated and responds to stimulation  Airway & Oxygen Therapy: Patient Spontanous Breathing and Patient connected to nasal cannula oxygen  Post-op Assessment: Report given to PACU RN, Post -op Vital signs reviewed and stable and Patient moving all extremities  Post vital signs: Reviewed and stable  Complications: No apparent anesthesia complications

## 2012-11-29 NOTE — Progress Notes (Signed)
Report called to Heather at 3300 where [t will be transferring to.  Nurse verbalized understanding.  Amanda Pea, Charity fundraiser.

## 2012-11-29 NOTE — Progress Notes (Signed)
Called by nurse that pts HR is running in the 140s. Previously was running in the 110s-120s, so cardizem drip was titrated up to 15mg /hr, but now running in the 140s, no SOB or dyspnea,Bp stable . Paged Dr. Shirlee Latch of [facility], he recommended starting IV metoprolol, 2.5mg  Q4H/as needed and titrating up to 5mg .

## 2012-11-29 NOTE — Anesthesia Postprocedure Evaluation (Signed)
Anesthesia Post Note  Patient: [name] Page  Procedure(s) Performed: Procedure(s) (LRB): LAPAROSCOPIC CHOLECYSTECTOMY (N/A)  Anesthesia type: general  Patient location: PACU  Post pain: Pain level controlled  Post assessment: Patient's Cardiovascular Status Stable  Last Vitals:  Filed Vitals:   11/29/12 0945  BP:   Pulse: 111  Temp: 36.4 C  Resp: 21    Post vital signs: Reviewed and stable  Level of consciousness: sedated  Complications: No apparent anesthesia complications

## 2012-11-29 NOTE — Progress Notes (Signed)
  Date: 11/29/2012  Patient name: [name] Page  Medical record number: [id number]  Date of birth: [birth year]   This patient has been seen and the plan of care was discussed with the house staff. Please see their note for complete details. I concur with their findings with the following additions/corrections:  Patient seen post op.  States pain is controlled. No CP or SOB or dizziness. He is s/p Lap Chole.  HR is ~90, Afib. Abdomen softly distended, hypoactive BS. Wean diltiazem and covert to PO metoprolol when he can tolerate diet.  Continue Cipro/Flagyl.    Jonah Blue, DO, FACP Faculty [facility] Residency Program 11/29/2012, 1:16 PM

## 2012-11-29 NOTE — Progress Notes (Signed)
Subjective: Patient seen at bedside a few hours post-op. He is in good spirits but does endorse some abdominal pain, which is to be expected. He denies chest pain or shortness of breath. He has not passed gas yet.  Objective: Vital signs in last 24 hours: Filed Vitals:   11/28/12 2100 11/29/12 0517 11/29/12 0923 11/29/12 0930  BP: 150/108 137/98 119/85 119/85  Pulse: 132 94 112 102  Temp: 97.7 F (36.5 C) 97.7 F (36.5 C) 97.8 F (36.6 C)   TempSrc: Oral Oral    Resp:  16  22  Height:      Weight:  159 lb 1.6 oz (72.167 kg)    SpO2:  97% 96% 99%   Weight change: -2 lb 0.9 oz (-0.933 kg)  Intake/Output Summary (Last 24 hours) at 11/29/12 7829 Last data filed at 11/29/12 5621  Gross per 24 hour  Intake   1800 ml  Output   1375 ml  Net    425 ml   Physical Exam  Constitutional: He is well-developed, well-nourished, and in no distress.  HENT:  Head: Normocephalic and atraumatic.  Patient is hard of hearing.  Eyes: Conjunctivae and EOM are normal. Pupils are equal, round, and reactive to light.  Neck: Normal range of motion. Neck supple.  Cardiovascular: S1 normal, S2 normal, normal heart sounds and intact distal pulses. An irregularly irregular rhythm present. Tachycardia present. Rate 80's on the monitor. Pulmonary/Chest: Effort normal and breath sounds normal. No respiratory distress. He has no wheezes. He has no rales. He exhibits no tenderness.  Abdominal: Soft. Hypoactive bowel sounds. Mild distension from insufflation. No mass. No tenderness to gentle palpation. There is no rebound and no guarding. Trochar incision sites are clean, dry, intact. Neurological: He is alert and calm. Moving all extremities. Skin: Skin is warm and dry. Psychiatric: Affect normal.   Lab Results: Basic Metabolic Panel:  Recent Labs Lab 11/28/12 0555 11/29/12 0425  NA 130* 131*  K 3.8 4.0  CL 97 99  CO2 23 18*  GLUCOSE 91 98  BUN 19 18  CREATININE 1.26 1.17  CALCIUM 8.9 8.5    Liver Function Tests:  Recent Labs Lab 11/26/12 0959  AST 28  ALT 22  ALKPHOS 116  BILITOT 0.7  PROT 7.5  ALBUMIN 3.5    Recent Labs Lab 11/26/12 0959  LIPASE 24   CBC:  Recent Labs Lab 11/26/12 0959  11/28/12 0555 11/29/12 0425  WBC 9.7  < > 9.8 8.9  NEUTROABS 8.2*  --   --   --   HGB 14.4  < > 14.5 13.7  HCT 39.6  < > 40.0 38.2*  MCV 97.5  < > 98.0 97.4  PLT 205  < > 193 201  < > = values in this interval not displayed. Cardiac Enzymes:  Recent Labs Lab 11/26/12 1920 11/27/12 0100 11/27/12 0830  TROPONINI <0.30 <0.30 <0.30   Coagulation:  Recent Labs Lab 11/27/12 0830  LABPROT 15.7*  INR 1.28   Urinalysis:  Recent Labs Lab 11/26/12 1022  COLORURINE YELLOW  LABSPEC 1.018  PHURINE 7.5  GLUCOSEU 100*  HGBUR NEGATIVE  BILIRUBINUR NEGATIVE  KETONESUR NEGATIVE  PROTEINUR NEGATIVE  UROBILINOGEN 1.0  NITRITE NEGATIVE  LEUKOCYTESUR NEGATIVE    Micro Results: Recent Results (from the past 240 hour(s))  MRSA PCR SCREENING     Status: None   Collection Time    11/26/12  5:13 PM      Result Value Range Status   MRSA  by PCR NEGATIVE  NEGATIVE Final   Comment:            The GeneXpert MRSA Assay (FDA     approved for NASAL specimens     only), is one component of a     comprehensive MRSA colonization     surveillance program. It is not     intended to diagnose MRSA     infection nor to guide or     monitor treatment for     MRSA infections.   Studies/Results: No results found. Medications: I have reviewed the patient's current medications. Scheduled Meds: . [facility] HOLD] ciprofloxacin  400 mg Intravenous BID  . fentaNYL      . [MAR HOLD] heparin  5,000 Units Subcutaneous Q8H  . [facility] HOLD] metoprolol  50 mg Oral BID  . [facility] HOLD] metronidazole  500 mg Intravenous Q8H  . [facility] HOLD] sodium chloride  3 mL Intravenous Q12H   Continuous Infusions: . 0.9 % NaCl with KCl 20 mEq / L 75 mL/hr at 11/27/12 0800  . diltiazem (CARDIZEM) infusion  10 mg/hr (11/29/12 0824)   PRN Meds:.fentaNYL, HYDROcodone-acetaminophen, morphine injection, [MAR HOLD] ondansetron (ZOFRAN) IV  Assessment/Plan: [name] Page is a 79 y.o. male PMH atrial fibrillation not on coumadin 2/2 PMH UGIB and SDH, HTN, CKD, recent hospital admission for AF with RVR and retained common bile duct stone with elevated LFTs requiring ERCP on 08/19/12, who presents as a transfer from [facility] with a chief complaint of abdominal pain, CT suggestive of cholecystitis.   #Acute cholecystitis s/p laparoscopic cholesystectomy - Patient went to the OR this morning for his lap chole. Per Dr. Dwain Sarna, his gallbladder was quite infected and dilated making it difficult to remove, but they were able to complete the surgery laparoscopically without complication. He tolerated anesthesia well. He was placed back on telemetry for evaluation of his AFib with RVR in the post-operative setting. - Many thanks to surgery - Continue IV ciprofloxacin 400mg  BID and flagyl 500mg  q8h, per Dr. Dwain Sarna he will need antibiotic coverage for at least 1 week post-op - IVF NS with KCl @75cc /hr  - Dilaudid prn pain  - Zofran prn nausea  - NPO in the immediate post-operative setting - Follow up am CMP, CBC  #Atrial fibrillation with rapid ventricular response - He was placed back on the diltiazem gtt overnight for better rate control pre- and post-operatively. Rate 80-110s currently. BP stable at 119/85. - Appreciate cardiology recs - Will wean diltiazem gtt as tolerated and convert back to his po metoprolol regimen when he is able to tolerate a diet - Continue cardiac monitoring  #Hypoactive bowel sounds - Some degree of postoperative ileus is expected as a normal obligatory and physiologic response in the hours following abdominal surgery.  - Will continue to monitor for bowel sounds, flatus, development of nausea/vomiting  #DVT PPX - Subq heparin   Dispo: Disposition is deferred at this  time, awaiting improvement of current medical problems.  Anticipated discharge in approximately 1-3 day(s).   The patient does have a current PCP (Vivi Barrack, MD) and does need an [facility] hospital follow-up appointment after discharge.  The patient does not have transportation limitations that hinder transportation to clinic appointments.  .Services Needed at time of discharge: Y = Yes, Blank = No PT:   OT:   RN:   Equipment:   Other:     LOS: 3 days   Vivi Barrack, MD 11/29/2012, 9:52 AM

## 2012-11-29 NOTE — Progress Notes (Signed)
Patient seen post op. Alert. Hemodynamically stable. No chest pain. HR well controlled on IV diltiazem. Agree with resuming metoprolol when taking po's well. No further cardiac issues. I will sign off. Please call if needed.  Wilmary Levit [place] MD, [facility]

## 2012-11-29 NOTE — Preoperative (Signed)
Beta Blockers   Reason not to administer Beta Blockers:Not Applicable 

## 2012-11-29 NOTE — Op Note (Signed)
Preoperative diagnosis: history choledocholithiasis, cholecystitis Postoperative diagnosis: same as above Procedure: Laparoscopic cholecystectomy Surgeon: Dr. Harden Mo Anesthesia: Gen. Estimated blood loss: Minimal Drains: None Specimens: Gallbladder and contents to pathology Complications: None Sponge and needle count correct at completion Disposition to recovery in stable condition  Indications: This is an 79 year old male who has atrial fibrillation. He in June had choledocholithiasis and underwent an ERCP to extract the stones. Since then he has been followed and has continued to have right upper quadrant pain. He is admitted this time with what appears to be cholecystitis. I had a long conversation with the wife about options and we decided to proceed with cholecystectomy. The risks of cholecystectomy and general anesthesia were discussed extensively with them prior to beginning.  Procedure: After informed consent was obtained the patient was taken to the operating room. He was already on an antibiotic regimen on the floor. Sequential compression devices were on his legs. He was placed under general anesthesia. He was maintained on a Cardizem drip throughout the operation. He was prepped and draped in the standard sterile surgical fashion. A surgical timeout was then performed.  I injected Marcaine below his umbilicus and made a vertical incision and incised his fascia. The peritoneum was entered bluntly. A 0 Vicryl purse string suture was placed in the fascia. A hasson trocar was introduced and the abdomen was then insufflated to 15 mmHg pressure. I inserted a 10 mm trocar in the epigastrium and 2 5 mm trocars on the right side without any complications. His gallbladder was noted to be encased in the omentum. His transverse colon was also adherent to this. I was unable to grasp his gallbladder well due to the thickness of the rind and the fact that he was distended. I decompressed his  gallbladder and was eventually able to retract cephalad but took about 20 minutes to very carefully remove the colon from the gallbladder and then was able to retract the gallbladder cephalad and lateral. I inspected the colon and there was no evidence of any injury. Eventually I was able to obtain the critical view of safety. I clipped the duct and divided it. I clipped the artery in a similar fashion. I then removed the gallbladder from the liver bed with some difficulty. There was a small hole made in the gallbladder with purulence to drain. I ended up removing this a small amount of the liver along with the gallbladder as there was no plane between the two. Eventually I was able to remove the gallbladder from the liver bed. The gallbladder barely fit in the Endo Catch bag and it was difficult to actually remove it. Eventually I was able to remove it from the umbilicus although I did enlarge this incision significantly just remove the gallbladder. I then obtained hemostasis. I placed a piece of Surgicel so in the bed of the gallbladder. I evacuated all the fluid. I then desufflated the abdomen removed all my trohars. I then used several additional 0 Vicryl sutures to completely close the umbilical defect. I then closed the incisions with 4 Monocryl and Dermabond. He tolerated this well for the most part with his atrial fibrillation. He was extubated and transferred to recovery stable. I discussed the case with the internal medicine team and he will go to step down following this.

## 2012-11-29 NOTE — Anesthesia Preprocedure Evaluation (Addendum)
Anesthesia Evaluation  Patient identified by MRN, date of birth, ID band Patient awake    Reviewed: Allergy & Precautions, H&P , NPO status , Patient's Chart, lab work & pertinent test results, reviewed documented beta blocker date and time   History of Anesthesia Complications Negative for: history of anesthetic complications  Airway Mallampati: I TM Distance: >3 FB Neck ROM: Full    Dental  (+) Edentulous Upper and Edentulous Lower   Pulmonary neg pulmonary ROS,    Pulmonary exam normal       Cardiovascular hypertension, Pt. on home beta blockers + dysrhythmias Atrial Fibrillation Rhythm:Irregular Rate:Normal     Neuro/Psych CVA negative psych ROS   GI/Hepatic GERD-  Medicated,  Endo/Other  negative endocrine ROS  Renal/GU negative Renal ROS     Musculoskeletal   Abdominal   Peds  Hematology   Anesthesia Other Findings   Reproductive/Obstetrics                          Anesthesia Physical Anesthesia Plan  ASA: III  Anesthesia Plan: General   Post-op Pain Management:    Induction: Intravenous  Airway Management Planned: Oral ETT  Additional Equipment:   Intra-op Plan:   Post-operative Plan: Extubation in OR  Informed Consent: I have reviewed the patients History and Physical, chart, labs and discussed the procedure including the risks, benefits and alternatives for the proposed anesthesia with the patient or authorized representative who has indicated his/her understanding and acceptance.   Dental advisory given  Plan Discussed with: CRNA, Anesthesiologist and Surgeon  Anesthesia Plan Comments:        Anesthesia Quick Evaluation

## 2012-11-29 NOTE — Anesthesia Procedure Notes (Signed)
Procedure Name: Intubation Date/Time: 11/29/2012 7:40 AM Performed by: Jerilee Hoh Pre-anesthesia Checklist: Patient identified, Emergency Drugs available, Suction available and Patient being monitored Patient Re-evaluated:Patient Re-evaluated prior to inductionOxygen Delivery Method: Circle system utilized Preoxygenation: Pre-oxygenation with 100% oxygen Intubation Type: IV induction Ventilation: Mask ventilation without difficulty Laryngoscope Size: Mac and 4 Grade View: Grade I Tube type: Oral Tube size: 7.5 mm Number of attempts: 1 Airway Equipment and Method: Stylet Placement Confirmation: ETT inserted through vocal cords under direct vision,  positive ETCO2 and breath sounds checked- equal and bilateral Secured at: 22 cm Tube secured with: Tape Dental Injury: Teeth and Oropharynx as per pre-operative assessment

## 2012-11-29 NOTE — Progress Notes (Signed)
Arrived from OR via stretcher after gall bladder surgery.  Pt A&0x3.  Derma bond dsg x4 to abd.  D&I.  Pt denies pain.  Will continue to monitor.  Amanda Pea, Charity fundraiser.

## 2012-11-30 LAB — COMPREHENSIVE METABOLIC PANEL
AST: 75 U/L — ABNORMAL HIGH (ref 0–37)
Albumin: 2.2 g/dL — ABNORMAL LOW (ref 3.5–5.2)
BUN: 18 mg/dL (ref 6–23)
Calcium: 7.8 mg/dL — ABNORMAL LOW (ref 8.4–10.5)
Chloride: 98 mEq/L (ref 96–112)
Creatinine, Ser: 1.31 mg/dL (ref 0.50–1.35)
GFR calc non Af Amer: 48 mL/min — ABNORMAL LOW (ref >90)
Total Bilirubin: 0.9 mg/dL (ref 0.3–1.2)

## 2012-11-30 LAB — CBC
Hemoglobin: 12.5 g/dL — ABNORMAL LOW (ref 13.0–17.0)
MCH: 35.1 pg — ABNORMAL HIGH (ref 26.0–34.0)
MCV: 97.2 fL (ref 78.0–100.0)
Platelets: 178 10*3/uL (ref 150–400)
RBC: 3.56 MIL/uL — ABNORMAL LOW (ref 4.22–5.81)
WBC: 12.4 10*3/uL — ABNORMAL HIGH (ref 4.0–10.5)

## 2012-11-30 MED ORDER — METOPROLOL TARTRATE 50 MG PO TABS
50.0000 mg | ORAL_TABLET | Freq: Two times a day (BID) | ORAL | Status: DC
  Administered 2012-11-30: 50 mg via ORAL
  Filled 2012-11-30 (×3): qty 1

## 2012-11-30 NOTE — Progress Notes (Signed)
1 Day Post-Op  Subjective: Pt denies pain/nausea.    Objective: Vital signs in last 24 hours: Temp:  [98.1 F (36.7 C)-99.3 F (37.4 C)] 98.2 F (36.8 C) (09/20 0807) Pulse Rate:  [78-114] 87 (09/20 0700) Resp:  [17-31] 24 (09/20 0700) BP: (91-131)/(55-74) 102/65 mmHg (09/20 0700) SpO2:  [91 %-100 %] 91 % (09/20 0700) Last BM Date: 11/26/12  Intake/Output from previous day: 09/19 0701 - 09/20 0700 In: 8251.2 [I.V.:6451.2; IV Piggyback:1800] Out: 1100 [Urine:1100] Intake/Output this shift:    General appearance: alert, cooperative and no distress Resp: breathing comfortably GI: soft, non distended, approp tender at incisions.    Lab Results:   Recent Labs  11/29/12 0425 11/30/12 0530  WBC 8.9 12.4*  HGB 13.7 12.5*  HCT 38.2* 34.6*  PLT 201 178   BMET  Recent Labs  11/29/12 0425 11/30/12 0530  NA 131* 127*  K 4.0 3.9  CL 99 98  CO2 18* 18*  GLUCOSE 98 117*  BUN 18 18  CREATININE 1.17 1.31  CALCIUM 8.5 7.8*   PT/INR No results found for this basename: LABPROT, INR,  in the last 72 hours ABG No results found for this basename: PHART, PCO2, PO2, HCO3,  in the last 72 hours  Studies/Results: No results found.  Anti-infectives: Anti-infectives   Start     Dose/Rate Route Frequency Ordered Stop   11/26/12 2000  metroNIDAZOLE (FLAGYL) IVPB 500 mg     500 mg 100 mL/hr over 60 Minutes Intravenous Every 8 hours 11/26/12 1756     11/26/12 1830  ciprofloxacin (CIPRO) IVPB 400 mg     400 mg 200 mL/hr over 60 Minutes Intravenous 2 times daily 11/26/12 1756     11/26/12 1330  piperacillin-tazobactam (ZOSYN) IVPB 3.375 g  Status:  Discontinued     3.375 g 12.5 mL/hr over 240 Minutes Intravenous  Once 11/26/12 1323 11/26/12 1327   11/26/12 1330  ciprofloxacin (CIPRO) IVPB 400 mg     400 mg 200 mL/hr over 60 Minutes Intravenous  Once 11/26/12 1327 11/26/12 1620      Assessment/Plan: s/p Procedure(s): LAPAROSCOPIC CHOLECYSTECTOMY (N/A) start advancing  diet. Afib per primary team   LOS: 4 days    [facility] 11/30/2012

## 2012-11-30 NOTE — Progress Notes (Signed)
Subjective: Patient seen at bedside. He is in good spirits and says his abdominal pain has improved. He denies chest pain or shortness of breath, nausea, vomiting. He has not passed gas yet. He still has some pain in his right leg when he flexes his hip.  Objective: Vital signs in last 24 hours: Filed Vitals:   11/30/12 0300 11/30/12 0425 11/30/12 0600 11/30/12 0700  BP: 91/57 96/55 91/55  102/65  Pulse: 88 88 84 87  Temp: 98.8 F (37.1 C)     TempSrc: Oral     Resp: 22 19 22 24   Height:      Weight:      SpO2: 95% 93% 92% 91%   Weight change:   Intake/Output Summary (Last 24 hours) at 11/30/12 4540 Last data filed at 11/30/12 0300  Gross per 24 hour  Intake 8251.17 ml  Output   1100 ml  Net 7151.17 ml   Physical Exam  Constitutional: He is well-developed, well-nourished, and in no distress.  HENT:  Head: Normocephalic and atraumatic.  Patient is hard of hearing.  Eyes: Conjunctivae and EOM are normal. Pupils are equal, round, and reactive to light.  Neck: Normal range of motion. Neck supple.  Cardiovascular: S1 normal, S2 normal, normal heart sounds and intact distal pulses. An irregularly irregular rhythm present. Rate 80's on the monitor. Pulmonary/Chest: Effort normal and breath sounds normal. No respiratory distress. He has no wheezes. He has no rales. He exhibits no tenderness.  Abdominal: Soft. Normal bowel sounds. No distention. No mass. No tenderness to gentle palpation. There is no rebound and no guarding. Trochar incision sites are clean, dry, intact. Musculoskeletal: No calf tenderness. No unilateral leg erythema, swelling. Patient winces with some hip flexion. Extremities warm and well perfused. Neurological: He is alert and calm. Moving all extremities. Skin: Skin is warm and dry. Psychiatric: Affect normal.   Lab Results: Basic Metabolic Panel:  Recent Labs Lab 11/29/12 0425 11/30/12 0530  NA 131* 127*  K 4.0 3.9  CL 99 98  CO2 18* 18*  GLUCOSE 98  117*  BUN 18 18  CREATININE 1.17 1.31  CALCIUM 8.5 7.8*   Liver Function Tests:  Recent Labs Lab 11/26/12 0959 11/30/12 0530  AST 28 75*  ALT 22 29  ALKPHOS 116 71  BILITOT 0.7 0.9  PROT 7.5 5.2*  ALBUMIN 3.5 2.2*    Recent Labs Lab 11/26/12 0959  LIPASE 24   CBC:  Recent Labs Lab 11/26/12 0959  11/29/12 0425 11/30/12 0530  WBC 9.7  < > 8.9 12.4*  NEUTROABS 8.2*  --   --   --   HGB 14.4  < > 13.7 12.5*  HCT 39.6  < > 38.2* 34.6*  MCV 97.5  < > 97.4 97.2  PLT 205  < > 201 178  < > = values in this interval not displayed. Cardiac Enzymes:  Recent Labs Lab 11/26/12 1920 11/27/12 0100 11/27/12 0830  TROPONINI <0.30 <0.30 <0.30   Coagulation:  Recent Labs Lab 11/27/12 0830  LABPROT 15.7*  INR 1.28   Urinalysis:  Recent Labs Lab 11/26/12 1022  COLORURINE YELLOW  LABSPEC 1.018  PHURINE 7.5  GLUCOSEU 100*  HGBUR NEGATIVE  BILIRUBINUR NEGATIVE  KETONESUR NEGATIVE  PROTEINUR NEGATIVE  UROBILINOGEN 1.0  NITRITE NEGATIVE  LEUKOCYTESUR NEGATIVE    Micro Results: Recent Results (from the past 240 hour(s))  MRSA PCR SCREENING     Status: None   Collection Time    11/26/12  5:13 PM  Result Value Range Status   MRSA by PCR NEGATIVE  NEGATIVE Final   Comment:            The GeneXpert MRSA Assay (FDA     approved for NASAL specimens     only), is one component of a     comprehensive MRSA colonization     surveillance program. It is not     intended to diagnose MRSA     infection nor to guide or     monitor treatment for     MRSA infections.   Studies/Results: No results found. Medications: I have reviewed the patient's current medications. Scheduled Meds: . ciprofloxacin  400 mg Intravenous BID  . heparin  5,000 Units Subcutaneous Q8H  . metoprolol  50 mg Oral BID  . metronidazole  500 mg Intravenous Q8H  . sodium chloride  3 mL Intravenous Q12H   Continuous Infusions: . 0.9 % NaCl with KCl 20 mEq / L 75 mL/hr at 11/30/12 0600    PRN Meds:.HYDROcodone-acetaminophen, metoprolol, morphine injection, ondansetron (ZOFRAN) IV  Assessment/Plan: CHARLES ANDRINGA is a 79 y.o. male PMH atrial fibrillation not on coumadin 2/2 PMH UGIB and SDH, HTN, CKD, recent hospital admission for AF with RVR and retained common bile duct stone with elevated LFTs requiring ERCP on 08/19/12, who presents as a transfer from [facility] with a chief complaint of abdominal pain, CT suggestive of cholecystitis.   #Acute cholecystitis s/p laparoscopic cholesystectomy - Patient is recovering well on post-op day 1. His pain is controlled. Bowel sounds have returned. He has a mild leukocytosis (12.4) this morning but is afebrile, no signs or symptoms of infection. Incisions are clean, dry, Intact. - Appreciate surgery recs - Continue IV ciprofloxacin 400mg  BID and flagyl 500mg  q8h, per Dr. Dwain Sarna he will need antibiotic coverage for at least 1 week post-op - IVF NS with KCl @75cc /hr  - Dilaudid prn pain  - Zofran prn nausea  - Advancing diet to clear liquids - Will continue to monitor for flatus, development of nausea/vomiting - AM BMP, CBC  #Atrial fibrillation with rapid ventricular response - Rate in the 80s currently. BP 90-100s/50-60s - Appreciate cardiology recs - Will wean diltiazem gtt and convert back to his po metoprolol regimen - Continue cardiac monitoring  #Leg pain - Unlikely DVT as patient is on heparin prophylaxis, has no calf tenderness, no unilateral swelling or erythema. K is wnl this morning. Per chart review he has a history of Baker's cyst in his right knee and atherosclerotic vascular disease, superficial femoral and popliteal on the right. He has good distal pulses, extremities are warm and well perfused. His pain seems worse with hip flexion which could indicate osteoarthritis; patient is wheelchair bound at home. - Continue to monitor  #DVT PPX - Subq heparin   Dispo: Disposition is deferred at this time,  awaiting improvement of current medical problems.  Anticipated discharge in approximately 1-3 day(s).   The patient does have a current PCP (Vivi Barrack, MD) and does need an [facility] hospital follow-up appointment after discharge.  The patient does not have transportation limitations that hinder transportation to clinic appointments.  .Services Needed at time of discharge: Y = Yes, Blank = No PT:   OT:   RN:   Equipment:   Other:     LOS: 4 days   Vivi Barrack, MD 11/30/2012, 8:22 AM

## 2012-12-01 LAB — CBC
HCT: 34.8 % — ABNORMAL LOW (ref 39.0–52.0)
Hemoglobin: 12.8 g/dL — ABNORMAL LOW (ref 13.0–17.0)
MCV: 96.7 fL (ref 78.0–100.0)
WBC: 14 10*3/uL — ABNORMAL HIGH (ref 4.0–10.5)

## 2012-12-01 LAB — BASIC METABOLIC PANEL
BUN: 19 mg/dL (ref 6–23)
Chloride: 98 mEq/L (ref 96–112)
Creatinine, Ser: 1.24 mg/dL (ref 0.50–1.35)
Glucose, Bld: 101 mg/dL — ABNORMAL HIGH (ref 70–99)
Potassium: 3.7 mEq/L (ref 3.5–5.1)

## 2012-12-01 MED ORDER — METOPROLOL TARTRATE 1 MG/ML IV SOLN
2.5000 mg | INTRAVENOUS | Status: DC | PRN
  Administered 2012-12-01 – 2012-12-03 (×2): 2.5 mg via INTRAVENOUS
  Filled 2012-12-01 (×3): qty 5

## 2012-12-01 MED ORDER — METOPROLOL TARTRATE 1 MG/ML IV SOLN
5.0000 mg | Freq: Once | INTRAVENOUS | Status: AC
  Administered 2012-12-01: 5 mg via INTRAVENOUS

## 2012-12-01 MED ORDER — SODIUM CHLORIDE 0.9 % IV SOLN
1.0000 g | INTRAVENOUS | Status: DC
  Administered 2012-12-01 – 2012-12-05 (×5): 1 g via INTRAVENOUS
  Filled 2012-12-01 (×6): qty 1

## 2012-12-01 MED ORDER — METOPROLOL TARTRATE 1 MG/ML IV SOLN
2.5000 mg | Freq: Once | INTRAVENOUS | Status: AC
  Administered 2012-12-01: 2.5 mg via INTRAVENOUS
  Filled 2012-12-01: qty 5

## 2012-12-01 MED ORDER — METOPROLOL TARTRATE 100 MG PO TABS
100.0000 mg | ORAL_TABLET | Freq: Two times a day (BID) | ORAL | Status: DC
  Administered 2012-12-01 – 2012-12-03 (×5): 100 mg via ORAL
  Filled 2012-12-01 (×7): qty 1

## 2012-12-01 MED ORDER — PROMETHAZINE HCL 25 MG/ML IJ SOLN
12.5000 mg | Freq: Four times a day (QID) | INTRAMUSCULAR | Status: DC | PRN
  Administered 2012-12-01 – 2012-12-03 (×3): 12.5 mg via INTRAVENOUS
  Filled 2012-12-01 (×3): qty 1

## 2012-12-01 MED ORDER — METOPROLOL TARTRATE 1 MG/ML IV SOLN
INTRAVENOUS | Status: AC
  Filled 2012-12-01: qty 5

## 2012-12-01 MED ORDER — BIOTENE DRY MOUTH MT LIQD
15.0000 mL | Freq: Two times a day (BID) | OROMUCOSAL | Status: DC
  Administered 2012-12-01 – 2012-12-10 (×14): 15 mL via OROMUCOSAL

## 2012-12-01 MED ORDER — LABETALOL HCL 5 MG/ML IV SOLN
INTRAVENOUS | Status: AC
  Filled 2012-12-01: qty 4

## 2012-12-01 MED ORDER — LABETALOL HCL 5 MG/ML IV SOLN
5.0000 mg | Freq: Once | INTRAVENOUS | Status: AC
  Administered 2012-12-01: 5 mg via INTRAVENOUS

## 2012-12-01 NOTE — Progress Notes (Signed)
Subjective: Patient seen at bedside. He is resting comfortably. He says he has no abdominal pain. He denies chest pain or shortness of breath, nausea, vomiting. He still has some pain in his right leg. He can't tell me very much about it except that it hurts more when he flexes the knee and hip.   Objective: Vital signs in last 24 hours: Filed Vitals:   11/30/12 1922 11/30/12 2348 12/01/12 0326 12/01/12 0745  BP: 134/83 128/79 117/79 127/85  Pulse: 122 110 109   Temp: 97.7 F (36.5 C) 98.8 F (37.1 C) 98.8 F (37.1 C) 97.9 F (36.6 C)  TempSrc: Oral Oral Oral Oral  Resp:  25 21   Height:      Weight:      SpO2:  95% 96%    Weight change:   Intake/Output Summary (Last 24 hours) at 12/01/12 0842 Last data filed at 12/01/12 0700  Gross per 24 hour  Intake 2587.5 ml  Output   1175 ml  Net 1412.5 ml   Physical Exam  Constitutional: He is well-developed, well-nourished, and in no distress.  HENT:  Head: Normocephalic and atraumatic.  Patient is hard of hearing.  Eyes: Conjunctivae and EOM are normal. Pupils are equal, round, and reactive to light.  Neck: Normal range of motion. Neck supple.  Cardiovascular: S1 normal, S2 normal, normal heart sounds and intact distal pulses. An irregularly irregular rhythm present. Rate 110 on the monitor. Pulmonary/Chest: Effort normal and breath sounds normal. No respiratory distress. He has no wheezes. He has no rales. He exhibits no tenderness.  Abdominal: Soft. Bowel sounds are present. No distention. No mass. No tenderness to palpation. There is no rebound and no guarding. Trochar incision sites are clean, dry, intact. Musculoskeletal: No calf tenderness. No unilateral leg erythema, no swelling. Patient winces with passive hip and knee flexion. Extremities warm and well perfused. Neurological: He is alert and calm. Moving all extremities. Skin: Skin is warm and dry. Psychiatric: Affect normal.   Lab Results: Basic Metabolic  Panel:  Recent Labs Lab 11/30/12 0530 12/01/12 0426  NA 127* 127*  K 3.9 3.7  CL 98 98  CO2 18* 21  GLUCOSE 117* 101*  BUN 18 19  CREATININE 1.31 1.24  CALCIUM 7.8* 8.0*   Liver Function Tests:  Recent Labs Lab 11/26/12 0959 11/30/12 0530  AST 28 75*  ALT 22 29  ALKPHOS 116 71  BILITOT 0.7 0.9  PROT 7.5 5.2*  ALBUMIN 3.5 2.2*    Recent Labs Lab 11/26/12 0959  LIPASE 24   CBC:  Recent Labs Lab 11/26/12 0959  11/30/12 0530 12/01/12 0426  WBC 9.7  < > 12.4* 14.0*  NEUTROABS 8.2*  --   --   --   HGB 14.4  < > 12.5* 12.8*  HCT 39.6  < > 34.6* 34.8*  MCV 97.5  < > 97.2 96.7  PLT 205  < > 178 166  < > = values in this interval not displayed. Cardiac Enzymes:  Recent Labs Lab 11/26/12 1920 11/27/12 0100 11/27/12 0830  TROPONINI <0.30 <0.30 <0.30   Coagulation:  Recent Labs Lab 11/27/12 0830  LABPROT 15.7*  INR 1.28   Urinalysis:  Recent Labs Lab 11/26/12 1022  COLORURINE YELLOW  LABSPEC 1.018  PHURINE 7.5  GLUCOSEU 100*  HGBUR NEGATIVE  BILIRUBINUR NEGATIVE  KETONESUR NEGATIVE  PROTEINUR NEGATIVE  UROBILINOGEN 1.0  NITRITE NEGATIVE  LEUKOCYTESUR NEGATIVE    Micro Results: Recent Results (from the past 240 hour(s))  MRSA PCR SCREENING     Status: None   Collection Time    11/26/12  5:13 PM      Result Value Range Status   MRSA by PCR NEGATIVE  NEGATIVE Final   Comment:            The GeneXpert MRSA Assay (FDA     approved for NASAL specimens     only), is one component of a     comprehensive MRSA colonization     surveillance program. It is not     intended to diagnose MRSA     infection nor to guide or     monitor treatment for     MRSA infections.   Studies/Results: No results found. Medications: I have reviewed the patient's current medications. Scheduled Meds: . ciprofloxacin  400 mg Intravenous BID  . heparin  5,000 Units Subcutaneous Q8H  . metoprolol  100 mg Oral BID  . metronidazole  500 mg Intravenous Q8H  .  sodium chloride  3 mL Intravenous Q12H   Continuous Infusions:   PRN Meds:.HYDROcodone-acetaminophen, metoprolol, ondansetron (ZOFRAN) IV  Assessment/Plan: [name] Page is a 79 y.o. male PMH atrial fibrillation not on coumadin 2/2 PMH UGIB and SDH, HTN, CKD, recent hospital admission for AF with RVR and retained common bile duct stone with elevated LFTs requiring ERCP on 08/19/12, who presents as a transfer from [facility] with a chief complaint of abdominal pain, CT suggestive of cholecystitis.   #Acute cholecystitis s/p laparoscopic cholesystectomy - Patient is recovering well on post-op day 2. His pain is controlled. Bowel sounds have returned. He again has a leukocytosis this morning (14.0 from 12.4) but is afebrile, no signs or symptoms of infection. Likely represents a stress reaction from his recent surgery. Surgical sites are clean, dry, Intact. - Appreciate surgery recs - Continue IV ciprofloxacin 400mg  BID and flagyl 500mg  q8h, per Dr. Dwain Sarna he will need antibiotic coverage for at least 1 week post-op - Discontinuing IVF - Advancing diet to regular - Up with assistance - Will continue to monitor for flatus/bowel movements - PT eval and treat - Norco prn pain  - Zofran prn nausea  - AM BMP, CBC  #Atrial fibrillation with rapid ventricular response - Rate in the 110s currently. BP stable at 110-140s/60-80s. - Appreciate cardiology recs, they have signed off - Increasing metoprolol to 100mg  po BID for improved rate control - Adding prn metoprolol 2.5mg  IV q4h prn for HR>105 - Will transfer out of step down unit to telemetry bed - Routine vital signs  #Leg pain - Unlikely DVT as patient is on heparin prophylaxis, has no calf tenderness, no unilateral swelling or erythema. No skin breakdown. K is wnl. Per chart review he has a history of right leg pain 2/2 Baker's cyst in his knee and atherosclerotic vascular disease, superficial femoral and popliteal on the right. He has  palpable distal pulses, extremities are warm and well perfused. His pain seems worse with hip and knee flexion which could indicate osteoarthritis vs. Baker's cyst; patient is wheelchair bound at home. - Up with assistance today - Will try to talk to wife about nature/chronicity of this problem  #DVT PPX - Subq heparin   Dispo: Disposition is deferred at this time, awaiting improvement of current medical problems.  Anticipated discharge in approximately 1-3 day(s).   The patient does have a current PCP (Vivi Barrack, MD) and does need an [facility] hospital follow-up appointment after discharge.  The patient does not have  transportation limitations that hinder transportation to clinic appointments.  .Services Needed at time of discharge: Y = Yes, Blank = No PT:   OT:   RN:   Equipment:   Other:     LOS: 5 days   Vivi Barrack, MD 12/01/2012, 8:42 AM

## 2012-12-01 NOTE — Progress Notes (Addendum)
Set the patient up for supper and when the tray came patient became nauseated but not vomiting. Tray removed and given Zofran mg IV. Heart rate has suddenly increased to 150's, have also given the patient 2.mg> Lopressor IV given. Will let attending service be aware of this episode.Blood pressure is 133/86 (96).Have talked with the resident and awaiting to orders.Doctor is present at bedside, obtained a EKG.

## 2012-12-01 NOTE — Progress Notes (Signed)
2 Days Post-Op  Subjective: Pt doing well, pain minimal.  No N/V, tolerating clears.  +flatus, no BM yet.  Hasn't been OOB yet.    Objective: Vital signs in last 24 hours: Temp:  [97.5 F (36.4 C)-98.8 F (37.1 C)] 97.9 F (36.6 C) (09/21 0745) Pulse Rate:  [89-122] 106 (09/21 0800) Resp:  [18-28] 20 (09/21 0800) BP: (99-134)/(68-85) 127/85 mmHg (09/21 0745) SpO2:  [93 %-96 %] 93 % (09/21 0800) Last BM Date: 11/26/12  Intake/Output from previous day: 09/20 0701 - 09/21 0700 In: 2587.5 [P.O.:240; I.V.:2247.5; IV Piggyback:100] Out: 1175 [Urine:1175] Intake/Output this shift:    PE: Gen:  Alert, NAD, pleasant Card:  Tachycardic in AFIB, but rate controlled to 120's Pulm:  CTA, no W/R/R, good effort Abd: Soft, minimal tenderness, ND, +BS, no HSM, incisions C/D/I, dermabond in place Ext:  No erythema, edema, or tenderness   Lab Results:   Recent Labs  11/30/12 0530 12/01/12 0426  WBC 12.4* 14.0*  HGB 12.5* 12.8*  HCT 34.6* 34.8*  PLT 178 166   BMET  Recent Labs  11/30/12 0530 12/01/12 0426  NA 127* 127*  K 3.9 3.7  CL 98 98  CO2 18* 21  GLUCOSE 117* 101*  BUN 18 19  CREATININE 1.31 1.24  CALCIUM 7.8* 8.0*   PT/INR No results found for this basename: LABPROT, INR,  in the last 72 hours CMP     Component Value Date/Time   NA 127* 12/01/2012 0426   K 3.7 12/01/2012 0426   CL 98 12/01/2012 0426   CO2 21 12/01/2012 0426   GLUCOSE 101* 12/01/2012 0426   BUN 19 12/01/2012 0426   CREATININE 1.24 12/01/2012 0426   CREATININE 1.80* 10/01/2012 0949   CALCIUM 8.0* 12/01/2012 0426   PROT 5.2* 11/30/2012 0530   ALBUMIN 2.2* 11/30/2012 0530   AST 75* 11/30/2012 0530   ALT 29 11/30/2012 0530   ALKPHOS 71 11/30/2012 0530   BILITOT 0.9 11/30/2012 0530   GFRNONAA 51* 12/01/2012 0426   GFRAA 59* 12/01/2012 0426   Lipase     Component Value Date/Time   LIPASE 24 11/26/2012 0959       Studies/Results: No results found.  Anti-infectives: Anti-infectives   Start      Dose/Rate Route Frequency Ordered Stop   11/26/12 2000  metroNIDAZOLE (FLAGYL) IVPB 500 mg     500 mg 100 mL/hr over 60 Minutes Intravenous Every 8 hours 11/26/12 1756     11/26/12 1830  ciprofloxacin (CIPRO) IVPB 400 mg     400 mg 200 mL/hr over 60 Minutes Intravenous 2 times daily 11/26/12 1756     11/26/12 1330  piperacillin-tazobactam (ZOSYN) IVPB 3.375 g  Status:  Discontinued     3.375 g 12.5 mL/hr over 240 Minutes Intravenous  Once 11/26/12 1323 11/26/12 1327   11/26/12 1330  ciprofloxacin (CIPRO) IVPB 400 mg     400 mg 200 mL/hr over 60 Minutes Intravenous  Once 11/26/12 1327 11/26/12 1620       Assessment/Plan POD #2 s/p lap chole 1.  Advance to reg diet 2.  Ambulate and IS 3.  SCD's and heparin 4. Continue antibiotics for 1 week total post op (on cipro now)  AFIB and other chronic medical conditions per primary service Leukocytosis - WBC up to 14.0 from 12.8 pulm source/atelectasis?  Disp: home when medically stable and tolerating regular diet, does not necessarily have to have a BM prior to discharge.    LOS: 5 days    DORT,  Cloyce Blankenhorn 12/01/2012, 9:09 AM Pager: [phone]

## 2012-12-01 NOTE — Progress Notes (Addendum)
Nurse called me to bedside at 1840. Per nurse, she sat patient upright in preparation to eat his dinner at which time patient's HR went up to the 150s and he c/o nausea. Subsequently, pt had dry heaves with a few episodes of vomiting clear mucous. Nausea improved with zofran. Lopressor 2.5mg  IV was given and after 10-15 minutes HR was still in the 130s-140s. Patient denied any CP, SOB, palpitations, HA, dizziness. VS: BP 133/86, satting 96% on room air. On physical exam, pt in NAD, normal WOB, and was benign except for known tachycardia. EKG was done, showed A fib with RVR (HR 133). I wrote for labetalol 5mg  x 1 dose. Put patient back on clear diet. Discussed with nurse. She will call if HR does not improve.   [facility], PGY1 Pager [phone] 12/01/2012, 6:54 PM

## 2012-12-01 NOTE — Progress Notes (Signed)
Patient seen and examined.  WBC rising on current abxs.  Will change to [employer].

## 2012-12-01 NOTE — Evaluation (Signed)
Physical Therapy Evaluation Patient Details Name: [name] Page MRN: [id number] DOB: Apr 18, 1924 Today's Date: 12/01/2012 Time: 1000-1025 PT Time Calculation (min): 25 min  PT Assessment / Plan / Recommendation History of Present Illness  [name] Page is a 79 y.o. male PMH atrial fibrillation not on coumadin 2/2 PMH UGIB and SDH, HTN, CKD, recent hospital admission for AF with RVR and retained common bile duct stone with elevated LFTs requiring ERCP on 08/19/12, who presents as a transfer from [facility] with a chief complaint of abdominal painMr. Page has had abdominal pain for about 2 days. It is mostly in the RUQ and epigastric region. He denies fevers, nausea, vomiting, diarrhea, bloody stools, chest pain, cough. Symptoms are persistent and nothing seems to make it better or worse. He is not able to describe the pain for [place]. He has had decreased po intake over the past 2 days, eating only Cheerios and half a sandwich yesterday and nothing today. He normally has a very good appetite, per his wife. He did not take his medications his morning for blood pressure and AFib. He is wheelchair bound. PT/OT recommended SNF during his last hospitalization, and he was discharged to [facility]. However, his wife eventually took him out as she felt she could provide the same care at home.  s/p Acute cholecystitis s/p laparoscopic cholesystectomy  Clinical Impression  Pt admitted with the above. Pt currently with functional limitations due to the deficits listed below (see PT Problem List). No family present on evaluation however per MD note pt's wife took pt out of SNF and stated "she could give him the same care."  Pt will benefit from skilled PT to increase their independence and safety with mobility to allow discharge to the venue listed below.      PT Assessment  Patient needs continued PT services    Follow Up Recommendations  Other (comment);SNF (However pt's wife took pt out of SNF from last  admission. ) Need to further discuss with pt's wife   Equipment Recommendations  None recommended by PT    Frequency Min 3X/week    Precautions / Restrictions Precautions Precautions: Fall   Pertinent Vitals/Pain C/o right knee pain but does not rate      Mobility  Bed Mobility Bed Mobility: Supine to Sit;Sitting - Scoot to Edge of Bed Supine to Sit: With rails;3: Mod assist Sitting - Scoot to Edge of Bed: With rail;2: Max assist Details for Bed Mobility Assistance: (A) to elevate trunk OOB with cues for technique Transfers Transfers: Sit to Stand;Stand to Sit;Stand Pivot Transfers Sit to Stand: 1: +2 Total assist;From bed;From elevated surface Sit to Stand: Patient Percentage: 60% Stand to Sit: 1: +2 Total assist;To chair/3-in-1 Stand to Sit: Patient Percentage: 60% Stand Pivot Transfers: 1: +2 Total assist Stand Pivot Transfers: Patient Percentage: 50% Details for Transfer Assistance: +2 (A) to initiate transfer and safely descend pt to recliner.  Max cues for hand placement.  (A) to advance LE into recliner with cues for proper technique Ambulation/Gait Ambulation/Gait Assistance: Not tested (comment)    Exercises     PT Diagnosis: Difficulty walking;Generalized weakness;Acute pain  PT Problem List: Decreased strength;Decreased range of motion;Decreased activity tolerance;Decreased balance;Decreased mobility;Decreased cognition;Decreased knowledge of use of DME PT Treatment Interventions: DME instruction;Stair training;Gait training;Functional mobility training;Therapeutic activities;Therapeutic exercise;Balance training;Patient/family education     PT Goals(Current goals can be found in the care plan section) Acute Rehab PT Goals Patient Stated Goal: Pt did not set PT Goal Formulation: Patient  unable to participate in goal setting Time For Goal Achievement: 12/08/12 Potential to Achieve Goals: Fair  Visit Information  Last PT Received On: 12/01/12 History of  Present Illness: [name] Page is a 79 y.o. male PMH atrial fibrillation not on coumadin 2/2 PMH UGIB and SDH, HTN, CKD, recent hospital admission for AF with RVR and retained common bile duct stone with elevated LFTs requiring ERCP on 08/19/12, who presents as a transfer from [facility] with a chief complaint of abdominal painMr. Page has had abdominal pain for about 2 days. It is mostly in the RUQ and epigastric region. He denies fevers, nausea, vomiting, diarrhea, bloody stools, chest pain, cough. Symptoms are persistent and nothing seems to make it better or worse. He is not able to describe the pain for [place]. He has had decreased po intake over the past 2 days, eating only Cheerios and half a sandwich yesterday and nothing today. He normally has a very good appetite, per his wife. He did not take his medications his morning for blood pressure and AFib. He is wheelchair bound. PT/OT recommended SNF during his last hospitalization, and he was discharged to [facility]. However, his wife eventually took him out as she felt she could provide the same care at home.  s/p Acute cholecystitis s/p laparoscopic cholesystectomy       Prior Functioning  Home Living Family/patient expects to be discharged to:: Private residence Living Arrangements: Spouse/significant other Available Help at Discharge: Family;Available 24 hours/day Type of Home: House Home Access: Stairs to enter [employer] of Steps: 4 Entrance Stairs-Rails: Right;Left;Can reach both Home Layout: One level Home Equipment: Walker - 2 wheels Prior Function Level of Independence: Needs assistance Gait / Transfers Assistance Needed: Per MD note pt's w/c bound Comments: No family present to assist with history and PLOF.  Home environment entered from last admission.   Communication Communication: HOH    Cognition  Cognition Arousal/Alertness: Awake/alert Behavior During Therapy: Flat affect Overall Cognitive Status: No  family/caregiver present to determine baseline cognitive functioning    Extremity/Trunk Assessment Lower Extremity Assessment Lower Extremity Assessment: Generalized weakness   Balance    End of Session PT - End of Session Equipment Utilized During Treatment: Gait belt Activity Tolerance: Patient limited by pain;Patient limited by fatigue Patient left: in chair;with call bell/phone within reach Nurse Communication: Mobility status  GP     Mckaylie Vasey 12/01/2012, 1:43 PM  Jake Shark, PT DPT [phone]

## 2012-12-02 ENCOUNTER — Encounter (HOSPITAL_COMMUNITY): Payer: Self-pay | Admitting: General Surgery

## 2012-12-02 ENCOUNTER — Inpatient Hospital Stay (HOSPITAL_COMMUNITY): Payer: Medicare Other

## 2012-12-02 DIAGNOSIS — M79609 Pain in unspecified limb: Secondary | ICD-10-CM

## 2012-12-02 LAB — CBC
HCT: 39.5 % (ref 39.0–52.0)
Hemoglobin: 14.6 g/dL (ref 13.0–17.0)
MCHC: 37 g/dL — ABNORMAL HIGH (ref 30.0–36.0)
RBC: 4.15 MIL/uL — ABNORMAL LOW (ref 4.22–5.81)

## 2012-12-02 LAB — COMPREHENSIVE METABOLIC PANEL
ALT: 23 U/L (ref 0–53)
AST: 37 U/L (ref 0–37)
BUN: 24 mg/dL — ABNORMAL HIGH (ref 6–23)
CO2: 23 mEq/L (ref 19–32)
Calcium: 8.5 mg/dL (ref 8.4–10.5)
Chloride: 95 mEq/L — ABNORMAL LOW (ref 96–112)
Creatinine, Ser: 1.09 mg/dL (ref 0.50–1.35)
GFR calc Af Amer: 69 mL/min — ABNORMAL LOW (ref >90)
GFR calc non Af Amer: 59 mL/min — ABNORMAL LOW (ref >90)
Glucose, Bld: 119 mg/dL — ABNORMAL HIGH (ref 70–99)
Potassium: 3.4 mEq/L — ABNORMAL LOW (ref 3.5–5.1)
Sodium: 130 mEq/L — ABNORMAL LOW (ref 135–145)
Total Bilirubin: 0.8 mg/dL (ref 0.3–1.2)
Total Protein: 5.9 g/dL — ABNORMAL LOW (ref 6.0–8.3)

## 2012-12-02 MED ORDER — POTASSIUM CHLORIDE CRYS ER 20 MEQ PO TBCR: 40.0000 meq | EXTENDED_RELEASE_TABLET | Freq: Once | ORAL | Status: DC

## 2012-12-02 MED ORDER — MORPHINE SULFATE 2 MG/ML IJ SOLN
1.0000 mg | INTRAMUSCULAR | Status: DC | PRN
  Administered 2012-12-02: 1 mg via INTRAVENOUS

## 2012-12-02 MED ORDER — DILTIAZEM HCL 25 MG/5ML IV SOLN
5.0000 mg | Freq: Once | INTRAVENOUS | Status: AC
  Administered 2012-12-02: 5 mg via INTRAVENOUS
  Filled 2012-12-02: qty 5

## 2012-12-02 MED ORDER — METOPROLOL TARTRATE 1 MG/ML IV SOLN
5.0000 mg | Freq: Once | INTRAVENOUS | Status: AC
  Administered 2012-12-02: 5 mg via INTRAVENOUS
  Filled 2012-12-02: qty 5

## 2012-12-02 MED ORDER — MORPHINE SULFATE 2 MG/ML IJ SOLN
INTRAMUSCULAR | Status: AC
  Filled 2012-12-02: qty 1

## 2012-12-02 NOTE — Clinical Documentation Improvement (Signed)
THIS DOCUMENT IS NOT A PERMANENT PART OF THE MEDICAL RECORD  Please update your documentation within the medical record to reflect your response to this query. If you need help knowing how to do this please call [phone].  12/02/12  To Dr. Vivi Barrack  Marton Redwood  In a better effort to capture your patient's severity of illness, reflect appropriate length of stay and utilization of resources, a review of the medical record has revealed the following indicators.    Based on your clinical judgment, please clarify and document in a progress note and/or discharge summary the clinical condition associated with the following supporting information:  In responding to this query please exercise your independent judgment.  The fact that a query is asked, does not imply that any particular answer is desired or expected.  Abnormal findings (laboratory, x-ray, pathologic, and other diagnostic results) are not coded and reported unless the physician indicates their clinical significance.   The medical record reflects the following clinical findings, please clarify the diagnostic and/or clinical significance:       Noted Sodium level low during this admission  (see below ) , BMET/CMP ordered.  If either diagnoses appropriate please document in chart.  Thank you    130 (L) 9/22    127 (L)    127 (L)    131 (L)    130 (L) 9/18       Possible Clinical Conditions?           Hyponatremia  SIADH                          Other Condition       Cannot Clinically Determine           Evaluated/Monitored:  CMP/ BMET's     Reviewed:  no additional documentation provided Sodium stable, now 145.   Thank Claud Kelp Addison  Clinical Documentation Specialist: [phone] Health Information Management [place]

## 2012-12-02 NOTE — Progress Notes (Signed)
Pt with approx. 150 ml of bile colored emesis. MD notified Orders received, 12.5mg  Promethazine given Pt resting.

## 2012-12-02 NOTE — Progress Notes (Signed)
Pt with approx. 75ml bile colored emesis, 12.5mg  Promethazine given. Pt resting comfortably

## 2012-12-02 NOTE — Progress Notes (Signed)
Pt with elevated HR 130-140's MD notified orders received

## 2012-12-02 NOTE — Progress Notes (Signed)
3 Days Post-Op  Subjective: Several episodes of emesis yesterday; back into Afib; wbc creeping up. Pt not to talkative. Denies abd pain, current nausea, denies flatus  Objective: Vital signs in last 24 hours: Temp:  [97.3 F (36.3 C)-98.6 F (37 C)] 97.7 F (36.5 C) (09/22 0728) Pulse Rate:  [83-133] 133 (09/22 0342) Resp:  [17-22] 22 (09/22 0342) BP: (102-152)/(59-101) 129/95 mmHg (09/22 0727) SpO2:  [94 %-100 %] 94 % (09/22 0342) Last BM Date: 11/26/12  Intake/Output from previous day: 09/21 0701 - 09/22 0700 In: 3 [I.V.:3] Out: 675 [Urine:675] Intake/Output this shift: Total I/O In: -  Out: 50 [Urine:50]  Resting comfortably, NAD, hiccups cta ant b/l  Tachy Soft, ND. Incisions c/d/i. Mild RUQ/epigastric TTP. No RT/guarding/peritonitis  Lab Results:   Recent Labs  12/01/12 0426 12/02/12 0345  WBC 14.0* 16.3*  HGB 12.8* 14.6  HCT 34.8* 39.5  PLT 166 206   BMET  Recent Labs  12/01/12 0426 12/02/12 0345  NA 127* 130*  K 3.7 3.4*  CL 98 95*  CO2 21 23  GLUCOSE 101* 119*  BUN 19 24*  CREATININE 1.24 1.09  CALCIUM 8.0* 8.5   PT/INR No results found for this basename: LABPROT, INR,  in the last 72 hours ABG No results found for this basename: PHART, PCO2, PO2, HCO3,  in the last 72 hours  Studies/Results: No results found.  Anti-infectives: Anti-infectives   Start     Dose/Rate Route Frequency Ordered Stop   12/01/12 1045  ertapenem (INVANZ) 1 g in sodium chloride 0.9 % 50 mL IVPB     1 g 100 mL/hr over 30 Minutes Intravenous Every 24 hours 12/01/12 0937     11/26/12 2000  metroNIDAZOLE (FLAGYL) IVPB 500 mg  Status:  Discontinued     500 mg 100 mL/hr over 60 Minutes Intravenous Every 8 hours 11/26/12 1756 12/01/12 0937   11/26/12 1830  ciprofloxacin (CIPRO) IVPB 400 mg  Status:  Discontinued     400 mg 200 mL/hr over 60 Minutes Intravenous 2 times daily 11/26/12 1756 12/01/12 0937   11/26/12 1330  piperacillin-tazobactam (ZOSYN) IVPB 3.375 g   Status:  Discontinued     3.375 g 12.5 mL/hr over 240 Minutes Intravenous  Once 11/26/12 1323 11/26/12 1327   11/26/12 1330  ciprofloxacin (CIPRO) IVPB 400 mg     400 mg 200 mL/hr over 60 Minutes Intravenous  Once 11/26/12 1327 11/26/12 1620      Assessment/Plan: s/p Procedure(s): LAPAROSCOPIC CHOLECYSTECTOMY (N/A) for acute on chronic cholecystitis  Developing probable ileus Would stick to clears only- if has ongoing intolerance to clears - check acute abd series Not sure what is driving up WBC. Afebrile. Was a difficult Lap chole per op note. Some spillage of pus. Cont IV abx for now. No role for CT at this time - too soon after surgery but may need CT in several days Primary team to manage afib  Mary Sella. Andrey Campanile, MD, FACS General, Bariatric, & Minimally Invasive Surgery [facility], [place]    LOS: 6 days    Atilano Ina 12/02/2012

## 2012-12-02 NOTE — Progress Notes (Signed)
Subjective: Patient seen at bedside. He appears uncomfortable. He says he feels "terrible." He endorses nausea. Denies abdominal pain (but he is very tender to palpation of RUQ and epigastric region). Denies chest pain or shortness of breath. Overnight he had 2 episodes of bilious vomiting. His diet was changed back to clears, and he got Zofran and Phenergen for nausea. His PR was also elevated to 130-140s overnight, requiring several doses of IV Lopressor, IV Labetalol x1.  Objective: Vital signs in last 24 hours: Filed Vitals:   12/01/12 2347 12/02/12 0342 12/02/12 0727 12/02/12 0728  BP: 144/86 152/101 129/95   Pulse: 129 133    Temp: 98.5 F (36.9 C) 98.5 F (36.9 C)  97.7 F (36.5 C)  TempSrc: Oral Oral  Oral  Resp: 22 22    Height:      Weight:      SpO2: 95% 94%     Weight change:   Intake/Output Summary (Last 24 hours) at 12/02/12 1042 Last data filed at 12/02/12 1610  Gross per 24 hour  Intake      0 ml  Output    725 ml  Net   -725 ml   Physical Exam  Constitutional: He appears uncomfortable. Hiccupping. HENT:  Head: Normocephalic and atraumatic.  Patient is hard of hearing.  Eyes: Conjunctivae and EOM are normal. Pupils are equal, round, and reactive to light.  Neck: Normal range of motion. Neck supple.  Cardiovascular: S1 normal, S2 normal, normal heart sounds and intact distal pulses. An irregularly irregular rhythm present. Rate 125 on the monitor. Pulmonary/Chest: Effort normal and breath sounds normal. No respiratory distress. He has no wheezes. He has no rales. He exhibits no tenderness.  Abdominal: Bowel sounds present. Mildly distended. Winces with gentle palpation of RUQ and epigastric areas. There is no rebound and no guarding. Trochar incision sites are clean, dry, intact. Musculoskeletal: No calf tenderness. No unilateral leg erythema, no swelling. Extremities warm and well perfused. Neurological: He is alert and calm. Moving all extremities. Skin:  Skin is warm and dry. Psychiatric: Affect normal.   Lab Results: Basic Metabolic Panel:  Recent Labs Lab 12/01/12 0426 12/02/12 0345  NA 127* 130*  K 3.7 3.4*  CL 98 95*  CO2 21 23  GLUCOSE 101* 119*  BUN 19 24*  CREATININE 1.24 1.09  CALCIUM 8.0* 8.5   Liver Function Tests:  Recent Labs Lab 11/30/12 0530 12/02/12 0345  AST 75* 37  ALT 29 23  ALKPHOS 71 102  BILITOT 0.9 0.8  PROT 5.2* 5.9*  ALBUMIN 2.2* 2.4*    Recent Labs Lab 11/26/12 0959  LIPASE 24   CBC:  Recent Labs Lab 11/26/12 0959  12/01/12 0426 12/02/12 0345  WBC 9.7  < > 14.0* 16.3*  NEUTROABS 8.2*  --   --   --   HGB 14.4  < > 12.8* 14.6  HCT 39.6  < > 34.8* 39.5  MCV 97.5  < > 96.7 95.2  PLT 205  < > 166 206  < > = values in this interval not displayed. Cardiac Enzymes:  Recent Labs Lab 11/26/12 1920 11/27/12 0100 11/27/12 0830  TROPONINI <0.30 <0.30 <0.30   Coagulation:  Recent Labs Lab 11/27/12 0830  LABPROT 15.7*  INR 1.28   Urinalysis:  Recent Labs Lab 11/26/12 1022  COLORURINE YELLOW  LABSPEC 1.018  PHURINE 7.5  GLUCOSEU 100*  HGBUR NEGATIVE  BILIRUBINUR NEGATIVE  KETONESUR NEGATIVE  PROTEINUR NEGATIVE  UROBILINOGEN 1.0  NITRITE NEGATIVE  LEUKOCYTESUR NEGATIVE    Micro Results: Recent Results (from the past 240 hour(s))  MRSA PCR SCREENING     Status: None   Collection Time    11/26/12  5:13 PM      Result Value Range Status   MRSA by PCR NEGATIVE  NEGATIVE Final   Comment:            The GeneXpert MRSA Assay (FDA     approved for NASAL specimens     only), is one component of a     comprehensive MRSA colonization     surveillance program. It is not     intended to diagnose MRSA     infection nor to guide or     monitor treatment for     MRSA infections.   Studies/Results: Dg Abd Portable 2v  12/02/2012   CLINICAL DATA:  Post lap cholecystectomy, nausea and vomiting, some abdominal distension  EXAM: PORTABLE ABDOMEN - 2 VIEW  COMPARISON:  CT  abdomen pelvis of 11/26/2012  FINDINGS: There is gaseous distention of the stomach. However the remainder of the bowel is not distended and there is air seen to the rectum. Surgical clips are noted in the right upper quadrant. A left lateral decubitus of the abdomen shows no evidence of free intraperitoneal air.  IMPRESSION: 1. Gaseous distention of the stomach. 2. No other evidence of bowel distention is seen   Electronically Signed   By: Dwyane Dee M.D.   On: 12/02/2012 08:51   Medications: I have reviewed the patient's current medications. Scheduled Meds: . antiseptic oral rinse  15 mL Mouth Rinse BID  . ertapenem  1 g Intravenous Q24H  . heparin  5,000 Units Subcutaneous Q8H  . metoprolol  100 mg Oral BID  . sodium chloride  3 mL Intravenous Q12H   Continuous Infusions:   PRN Meds:.HYDROcodone-acetaminophen, metoprolol, morphine injection, promethazine  Assessment/Plan: [name] Page is a 79 y.o. male PMH atrial fibrillation not on coumadin 2/2 PMH UGIB and SDH, HTN, CKD, recent hospital admission for AF with RVR and retained common bile duct stone with elevated LFTs requiring ERCP on 08/19/12, who presents as a transfer from [facility] with a chief complaint of abdominal pain, CT suggestive of cholecystitis.   #Acute cholecystitis s/p laparoscopic cholesystectomy - Patient has signs of post-operative ileus after advancing his diet last night, including nausea and bilious vomiting. He also has a progressively increasing leukocytosis (12.4>14.0>16.3 this morning). He is afebrile but did have some heart rate control issues overnight, which could indicate an early systemic inflammatory response. Surgical sites are clean, dry, intact. His abdomen is mildly distended with some RUQ pain. Of note his surgery was difficult requiring some liver resection, and some spillage of pus occurred. - Appreciate surgery recs - Continue IV ertapenem - Clear liquid diet - Ordered 2 view abdominal XR, which  showed gaseous distention of the stomach but no other evidence of bowel distention - NG tube to low continuous suction for gastric decompression, scant brown fluid and air released when placed - Up with assistance - PT recommended SNF, but need to discuss with wife as she took him out of SNF last time - Norco prn pain  - Zofran prn nausea  - AM BMP, CBC  #Atrial fibrillation with rapid ventricular response - Rate in the 120-130s currently. BP stable. - Appreciate cardiology recs, they have signed off - Continue metoprolol 100mg  po BID - Will give cardizem 5mg  IV this morning as well -  Prn metoprolol 2.5mg  IV q4h prn for HR>105 - Cardiac monitoring - Routine vital signs  #Leg pain - Unlikely DVT as patient is on heparin prophylaxis, has no calf tenderness, no unilateral swelling or erythema. No skin breakdown. K is wnl. Per chart review he has a history of right leg pain 2/2 Baker's cyst in his knee and atherosclerotic vascular disease, superficial femoral and popliteal on the right. - Up with assistance today - Continue to monitor  #DVT PPX - Subq heparin   Dispo: Disposition is deferred at this time, awaiting improvement of current medical problems.  Anticipated discharge in approximately 1-3 day(s).   The patient does have a current PCP (Vivi Barrack, MD) and does need an [facility] hospital follow-up appointment after discharge.  The patient does not have transportation limitations that hinder transportation to clinic appointments.  .Services Needed at time of discharge: Y = Yes, Blank = No PT:   OT:   RN:   Equipment:   Other:     LOS: 6 days   Vivi Barrack, MD 12/02/2012, 10:42 AM

## 2012-12-02 NOTE — Progress Notes (Signed)
Gastric tube ordered, placed-pt tolerated well, applied to low wall suction-scant brown fluid and air released once placed, Verified placement with another RN by auscultation. Placed mittens on due to trying to pull at tube.  Will continue to monitor.   MD's at bedside right after placement.

## 2012-12-02 NOTE — Progress Notes (Signed)
Report called to 6N, VSS, meds current to time. Will continue to monitor.  NG tube placed to ILWS per order.

## 2012-12-02 NOTE — Progress Notes (Signed)
Abdomen xray's completed.

## 2012-12-02 NOTE — Progress Notes (Signed)
Received patient from 3S , alert oriented X 1 to self, not in any distress,noted patient on mittens for safety, NGT intact, reconnected to low intermittent suction, reconnected to monitor.VSS. Will continue to monitor.

## 2012-12-02 NOTE — Progress Notes (Signed)
Utilization review completed.  

## 2012-12-02 NOTE — Progress Notes (Signed)
  Date: 12/02/2012  Patient name: [name] Page  Medical record number: [id number]  Date of birth: Oct 09, 1924   This patient has been seen and the plan of care was discussed with the house staff. Please see their note for complete details. I concur with their findings.  Inez Catalina, MD 12/02/2012, 12:54 PM

## 2012-12-02 NOTE — Progress Notes (Signed)
Pt HR remains elevated MD notified orders received

## 2012-12-02 NOTE — Progress Notes (Addendum)
Spoke with Mrs. Going on the phone and gave her an update. We also talked about sending Mr. Kwasny to a SNF after discharge. She is amenable to it and says he really liked the last one he went to, [facility] (?). She just wants to make sure it will be covered by insurance and it won't cost them a lot of money in terms of co-pay. I told her we would work with the social workers when the time comes.  Vivi Barrack, MD  [email or web address] Pager # [phone] Office # [phone]

## 2012-12-03 DIAGNOSIS — R578 Other shock: Secondary | ICD-10-CM

## 2012-12-03 DIAGNOSIS — K922 Gastrointestinal hemorrhage, unspecified: Secondary | ICD-10-CM

## 2012-12-03 DIAGNOSIS — G9340 Encephalopathy, unspecified: Secondary | ICD-10-CM | POA: Diagnosis present

## 2012-12-03 LAB — CBC
HCT: 32.2 % — ABNORMAL LOW (ref 39.0–52.0)
Hemoglobin: 11.6 g/dL — ABNORMAL LOW (ref 13.0–17.0)
MCH: 34.9 pg — ABNORMAL HIGH (ref 26.0–34.0)
MCHC: 36 g/dL (ref 30.0–36.0)
MCV: 97 fL (ref 78.0–100.0)
RDW: 12.2 % (ref 11.5–15.5)

## 2012-12-03 LAB — BASIC METABOLIC PANEL
BUN: 39 mg/dL — ABNORMAL HIGH (ref 6–23)
CO2: 25 mEq/L (ref 19–32)
Calcium: 8.6 mg/dL (ref 8.4–10.5)
Chloride: 98 mEq/L (ref 96–112)
Creatinine, Ser: 1.23 mg/dL (ref 0.50–1.35)
GFR calc non Af Amer: 51 mL/min — ABNORMAL LOW (ref >90)
Glucose, Bld: 105 mg/dL — ABNORMAL HIGH (ref 70–99)
Sodium: 134 mEq/L — ABNORMAL LOW (ref 135–145)

## 2012-12-03 LAB — CBC WITH DIFFERENTIAL/PLATELET
Eosinophils Absolute: 0 10*3/uL (ref 0.0–0.7)
Eosinophils Relative: 0 % (ref 0–5)
HCT: 39.2 % (ref 39.0–52.0)
Hemoglobin: 14.1 g/dL (ref 13.0–17.0)
Lymphocytes Relative: 8 % — ABNORMAL LOW (ref 12–46)
Lymphs Abs: 1.2 10*3/uL (ref 0.7–4.0)
MCH: 34.8 pg — ABNORMAL HIGH (ref 26.0–34.0)
MCV: 96.8 fL (ref 78.0–100.0)
Monocytes Absolute: 1.2 10*3/uL — ABNORMAL HIGH (ref 0.1–1.0)
Monocytes Relative: 8 % (ref 3–12)
Platelets: 229 10*3/uL (ref 150–400)
RBC: 4.05 MIL/uL — ABNORMAL LOW (ref 4.22–5.81)
WBC: 14.9 10*3/uL — ABNORMAL HIGH (ref 4.0–10.5)

## 2012-12-03 LAB — COMPREHENSIVE METABOLIC PANEL
ALT: 17 U/L (ref 0–53)
Alkaline Phosphatase: 101 U/L (ref 39–117)
BUN: 45 mg/dL — ABNORMAL HIGH (ref 6–23)
CO2: 20 mEq/L (ref 19–32)
Chloride: 97 mEq/L (ref 96–112)
Creatinine, Ser: 1.35 mg/dL (ref 0.50–1.35)
GFR calc Af Amer: 53 mL/min — ABNORMAL LOW (ref >90)
GFR calc non Af Amer: 46 mL/min — ABNORMAL LOW (ref >90)
Glucose, Bld: 160 mg/dL — ABNORMAL HIGH (ref 70–99)
Potassium: 3.5 mEq/L (ref 3.5–5.1)
Total Bilirubin: 0.5 mg/dL (ref 0.3–1.2)
Total Protein: 5.5 g/dL — ABNORMAL LOW (ref 6.0–8.3)

## 2012-12-03 LAB — PROTIME-INR: Prothrombin Time: 17 seconds — ABNORMAL HIGH (ref 11.6–15.2)

## 2012-12-03 LAB — PREPARE RBC (CROSSMATCH)

## 2012-12-03 MED ORDER — AMIODARONE HCL IN DEXTROSE 360-4.14 MG/200ML-% IV SOLN
60.0000 mg/h | INTRAVENOUS | Status: DC
  Filled 2012-12-03: qty 200

## 2012-12-03 MED ORDER — PANTOPRAZOLE SODIUM 40 MG IV SOLR
40.0000 mg | Freq: Two times a day (BID) | INTRAVENOUS | Status: DC
  Filled 2012-12-03: qty 40

## 2012-12-03 MED ORDER — POTASSIUM CHLORIDE IN NACL 20-0.9 MEQ/L-% IV SOLN
INTRAVENOUS | Status: DC
  Administered 2012-12-03: 10:00:00 via INTRAVENOUS
  Filled 2012-12-03 (×2): qty 1000

## 2012-12-03 MED ORDER — IOHEXOL 300 MG/ML  SOLN
25.0000 mL | INTRAMUSCULAR | Status: AC
  Administered 2012-12-03 (×2): 25 mL via ORAL

## 2012-12-03 MED ORDER — SODIUM CHLORIDE 0.9 % IV SOLN
80.0000 mg | Freq: Once | INTRAVENOUS | Status: AC
  Administered 2012-12-03: 19:00:00 80 mg via INTRAVENOUS
  Filled 2012-12-03: qty 80

## 2012-12-03 MED ORDER — POTASSIUM CHLORIDE IN NACL 20-0.9 MEQ/L-% IV SOLN
INTRAVENOUS | Status: DC
  Administered 2012-12-03: 150 mL/h via INTRAVENOUS
  Filled 2012-12-03 (×2): qty 1000

## 2012-12-03 MED ORDER — PANTOPRAZOLE SODIUM 40 MG IV SOLR: 40.0000 mg | Freq: Two times a day (BID) | INTRAVENOUS | Status: DC

## 2012-12-03 MED ORDER — HYDROCORTISONE SOD SUCCINATE 100 MG IJ SOLR
50.0000 mg | Freq: Four times a day (QID) | INTRAMUSCULAR | Status: DC
  Administered 2012-12-03 – 2012-12-05 (×6): 50 mg via INTRAVENOUS
  Filled 2012-12-03 (×11): qty 1

## 2012-12-03 MED ORDER — AMIODARONE HCL IN DEXTROSE 360-4.14 MG/200ML-% IV SOLN
30.0000 mg/h | INTRAVENOUS | Status: DC
  Filled 2012-12-03: qty 200

## 2012-12-03 MED ORDER — AMIODARONE LOAD VIA INFUSION
150.0000 mg | Freq: Once | INTRAVENOUS | Status: DC
  Filled 2012-12-03: qty 83.34

## 2012-12-03 MED ORDER — AMIODARONE HCL IN DEXTROSE 360-4.14 MG/200ML-% IV SOLN
30.0000 mg/h | INTRAVENOUS | Status: DC
  Administered 2012-12-05 – 2012-12-06 (×3): 30 mg/h via INTRAVENOUS
  Filled 2012-12-03 (×12): qty 200

## 2012-12-03 MED ORDER — PANTOPRAZOLE SODIUM 40 MG IV SOLR
40.0000 mg | INTRAVENOUS | Status: DC
  Administered 2012-12-03: 40 mg via INTRAVENOUS
  Filled 2012-12-03: qty 40

## 2012-12-03 MED ORDER — PANTOPRAZOLE SODIUM 40 MG IV SOLR: 40.0000 mg | Freq: Once | INTRAVENOUS | Status: DC

## 2012-12-03 MED ORDER — SODIUM CHLORIDE 0.9 % IV BOLUS (SEPSIS)
500.0000 mL | Freq: Once | INTRAVENOUS | Status: AC
  Administered 2012-12-03: 500 mL via INTRAVENOUS

## 2012-12-03 MED ORDER — SODIUM CHLORIDE 0.9 % IV SOLN: INTRAVENOUS | Status: DC

## 2012-12-03 MED ORDER — SODIUM CHLORIDE 0.9 % IV BOLUS (SEPSIS): 1000.0000 mL | INTRAVENOUS | Status: DC | PRN

## 2012-12-03 MED ORDER — SODIUM CHLORIDE 0.9 % IV SOLN
8.0000 mg/h | INTRAVENOUS | Status: DC
  Administered 2012-12-03 – 2012-12-06 (×6): 8 mg/h via INTRAVENOUS
  Filled 2012-12-03 (×14): qty 80

## 2012-12-03 MED ORDER — METOPROLOL TARTRATE 1 MG/ML IV SOLN
5.0000 mg | Freq: Four times a day (QID) | INTRAVENOUS | Status: DC
  Administered 2012-12-03: 5 mg via INTRAVENOUS

## 2012-12-03 MED ORDER — AMIODARONE HCL IN DEXTROSE 360-4.14 MG/200ML-% IV SOLN
60.0000 mg/h | INTRAVENOUS | Status: AC
  Administered 2012-12-03: 60 mg/h via INTRAVENOUS
  Filled 2012-12-03: qty 200

## 2012-12-03 MED ORDER — SODIUM CHLORIDE 0.9 % IV SOLN
8.0000 mg/h | INTRAVENOUS | Status: DC
  Filled 2012-12-03 (×2): qty 80

## 2012-12-03 NOTE — Progress Notes (Signed)
Patient ID: [name] Page, male   DOB: [birth year], 79 y.o.   MRN: [id number] Notified by staff that rapid response was called. Patient had decreased LOC, AF RVR, hypotension and loose stools. He was set to gat a CT of his abdomen and had drank contrast but not been down for it yet. On exam: SBP 70s, HR 170s appears AF RVR. Responds to voice. Abdomen soft with only mild RUQ tenderness. Primary team is transferring him to ICU. I will also notify Dr. Andrey Campanile. Violeta Gelinas, MD, MPH, FACS Pager: [phone]

## 2012-12-03 NOTE — Progress Notes (Signed)
Patient arrived from [address] [place] with RR RN Eunice Blase) and Architect. Patient on arrival BP 62/38, HR 188, RR 29, Emergency blood going at 999, normal saline going at 999 cc/hr. Patient responds to name. 2nd unit of Emergency PRBC hung and IVF continued at 999 cc/hr, per Dr. Andrey Campanile (at bedside). Patients wife at bedside. Continue to monitor .

## 2012-12-03 NOTE — Progress Notes (Signed)
  Date: 12/03/2012  Patient name: [name] Page  Medical record number: [id number]  Date of birth: [birth year]   This patient has been seen and the plan of care was discussed with the house staff. Please see their note for complete details. I concur with their findings with the following additions/corrections:  Update to Dr. Doristine Locks note - will change PO metoprolol to IV metoprolol 5mg  q6 hours with PRN metoprolol for HR > 110.  Will uptitrate as needed to get desired effect, which is stable HR < 110 on average.  He is currently still NPO and on periodic suction so PO metoprolol is not a good option for him.   Inez Catalina, MD 12/03/2012, 2:17 PM

## 2012-12-03 NOTE — Progress Notes (Signed)
Pt with elevated heart rate of greater than 140 since beginning of shift. Noted as high as the 160s with ADLs and cleaning. MD made aware and prn metoprolol given prn. Last call made to MD for change in schedule PO metoprolol to IV if possible since pt is npo. Order given. Pt given 1st dose of 5 mg prior to 1pm. Called to patient's room at 1445. Pt noted to have a large watery stool with dark red blood. Within 30 minutes pt has had 3 watery stools with some bright red blood also noted but majority of dark red in color. Surgery notified and internal medicine notified. Order given for stat cbc. Order entered by MD. Pt became diaphoretic and less responsive.  Rapid response rn notified. Interventions started. Patient's wife called in.  See RRT note for details. Vwilliams,rn.

## 2012-12-03 NOTE — Progress Notes (Addendum)
Pt states he is nauseous. Had large BM this am. C/o soreness in upper abd  When i walked in room, BRB in NG tube suction tubing.  NAD Soft, ND. Grimaces a little with RUQ palpation. No guarding/rebound.   Believe BRB is ng tube "trauma" -set on suction; flushed NG -no additional blood.   Although wbc down a little and had large BM, i think the safest thing is to scan the pt given persistently elevated wbc and ongoing mild TTP in RUQ and how difficult his gallbladder surgery was.   Cont NG for now - can give contrast thru NG Check CT abd/pev with contrast Will defer IVF to primary team Add protonix Npo except meds for now  Mary Sella. Andrey Campanile, MD, FACS General, Bariatric, & Minimally Invasive Surgery [facility] Surgery, [place]

## 2012-12-03 NOTE — Progress Notes (Signed)
  Date: 12/03/2012  Patient name: [name] Page  Medical record number: [id number]  Date of birth: 08/31/1924   Called by on call Resident to evaluate [name] Page.   Apparently, on the way to CT scan this afternoon, [name] Page had a large bloody bowel movement and became unresponsive.  His BP per report decreased to the SBP of 40s, improved to 70s when I saw him with some improvement in mental status.  Patient received blood work, IVF at 999 with NS and improved with SBP to 141.  NGT was placed to wall suction and drained some bright red blood.  About 500cc of output from NGT was recorded for today.  [name] Page was improved when I saw him, abdomen was soft, he was responding to questions and commands.   Despite improvement in his BP, his HR has maintained in the 180s.    PCCM was consulted.  As [name] Page is DNR (confirmed with family and by advanced directive review), he will be transferred to a step down bed for further care with PCCM following.  He will be given blood emergently and further blood products depending on stat CBC.  The team will follow up stat labs.  Currently HR remains in the 180s, however, due to his tenuous BP it will be difficult to treat at this time.  We will consult his Cardiologist for further recommendations.  GI will be consulted for evaluation of likely GI bleeding.    Inez Catalina, MD 12/03/2012, 4:38 PM

## 2012-12-03 NOTE — Progress Notes (Signed)
Patient ID: [name] Page, male   DOB: [birth year], 79 y.o.   MRN: [id number]  Subjective: Pt lying in bed, mittens on.  NGT with suction off.  Pt  Incontinent, large BM.  Nursing was called to help clean pt.    Objective:  Vital signs:  Filed Vitals:   12/02/12 1819 12/02/12 2227 12/03/12 0156 12/03/12 0541  BP: 115/92 129/98 138/98 138/98  Pulse: 123 128 119 118  Temp: 98.9 F (37.2 C) 97.6 F (36.4 C) 97.9 F (36.6 C) 97.8 F (36.6 C)  TempSrc: Oral Oral    Resp: 20 19 17 16   Height:      Weight:      SpO2: 95% 93% 95% 94%    Last BM Date: 12/01/12  Intake/Output   Yesterday:  09/22 0701 - 09/23 0700 In: 53 [I.V.:3; IV Piggyback:50] Out: 975 [Urine:525; Emesis/NG output:450] This shift:    Physical Exam:  General: Pt awake/alert/oriented x3 in no acute distress.  Recalled he is at [facility] hospital and looked at calendar on the wall for the date.   Chest: CTA  No chest wall pain w good excursion CV:  Pulses intact.  Regular rhythm Abdomen: +BS Soft.  Nondistended.   Mildly tender at incisions only.  No evidence of peritonitis.  No incarcerated hernias.  Problem List:   Principal Problem:   Cholecystitis, acute Active Problems:   CKD (chronic kidney disease), stage III   Hearing impairment   Atrial fibrillation with RVR    Results:   Labs: Results for orders placed during the hospital encounter of 11/26/12 (from the past 48 hour(s))  COMPREHENSIVE METABOLIC PANEL     Status: Abnormal   Collection Time    12/02/12  3:45 AM      Result Value Range   Sodium 130 (*) 135 - 145 mEq/L   Potassium 3.4 (*) 3.5 - 5.1 mEq/L   Chloride 95 (*) 96 - 112 mEq/L   CO2 23  19 - 32 mEq/L   Glucose, Bld 119 (*) 70 - 99 mg/dL   BUN 24 (*) 6 - 23 mg/dL   Creatinine, Ser 4.09  0.50 - 1.35 mg/dL   Calcium 8.5  8.4 - 81.1 mg/dL   Total Protein 5.9 (*) 6.0 - 8.3 g/dL   Albumin 2.4 (*) 3.5 - 5.2 g/dL   AST 37  0 - 37 U/L   ALT 23  0 - 53 U/L   Alkaline Phosphatase 102  39 -  117 U/L   Total Bilirubin 0.8  0.3 - 1.2 mg/dL   GFR calc non Af Amer 59 (*) >90 mL/min   GFR calc Af Amer 69 (*) >90 mL/min   Comment: (NOTE)     The eGFR has been calculated using the CKD EPI equation.     This calculation has not been validated in all clinical situations.     eGFR's persistently <90 mL/min signify possible Chronic Kidney     Disease.  CBC     Status: Abnormal   Collection Time    12/02/12  3:45 AM      Result Value Range   WBC 16.3 (*) 4.0 - 10.5 K/uL   RBC 4.15 (*) 4.22 - 5.81 MIL/uL   Hemoglobin 14.6  13.0 - 17.0 g/dL   HCT 91.4  78.2 - 95.6 %   MCV 95.2  78.0 - 100.0 fL   MCH 35.2 (*) 26.0 - 34.0 pg   MCHC 37.0 (*) 30.0 - 36.0 g/dL  RDW 12.0  11.5 - 15.5 %   Platelets 206  150 - 400 K/uL  CBC WITH DIFFERENTIAL     Status: Abnormal   Collection Time    12/03/12  4:10 AM      Result Value Range   WBC 14.9 (*) 4.0 - 10.5 K/uL   RBC 4.05 (*) 4.22 - 5.81 MIL/uL   Hemoglobin 14.1  13.0 - 17.0 g/dL   HCT 54.0  98.1 - 19.1 %   MCV 96.8  78.0 - 100.0 fL   MCH 34.8 (*) 26.0 - 34.0 pg   MCHC 36.0  30.0 - 36.0 g/dL   RDW 47.8  29.5 - 62.1 %   Platelets 229  150 - 400 K/uL   Neutrophils Relative % 84 (*) 43 - 77 %   Neutro Abs 12.5 (*) 1.7 - 7.7 K/uL   Lymphocytes Relative 8 (*) 12 - 46 %   Lymphs Abs 1.2  0.7 - 4.0 K/uL   Monocytes Relative 8  3 - 12 %   Monocytes Absolute 1.2 (*) 0.1 - 1.0 K/uL   Eosinophils Relative 0  0 - 5 %   Eosinophils Absolute 0.0  0.0 - 0.7 K/uL   Basophils Relative 0  0 - 1 %   Basophils Absolute 0.0  0.0 - 0.1 K/uL  BASIC METABOLIC PANEL     Status: Abnormal   Collection Time    12/03/12  4:10 AM      Result Value Range   Sodium 134 (*) 135 - 145 mEq/L   Potassium 3.6  3.5 - 5.1 mEq/L   Chloride 98  96 - 112 mEq/L   CO2 25  19 - 32 mEq/L   Glucose, Bld 105 (*) 70 - 99 mg/dL   BUN 39 (*) 6 - 23 mg/dL   Comment: DELTA CHECK NOTED   Creatinine, Ser 1.23  0.50 - 1.35 mg/dL   Calcium 8.6  8.4 - 30.8 mg/dL   GFR calc non Af  Amer 51 (*) >90 mL/min   GFR calc Af Amer 59 (*) >90 mL/min   Comment: (NOTE)     The eGFR has been calculated using the CKD EPI equation.     This calculation has not been validated in all clinical situations.     eGFR's persistently <90 mL/min signify possible Chronic Kidney     Disease.    Imaging / Studies: Dg Abd Portable 2v  12/02/2012   CLINICAL DATA:  Post lap cholecystectomy, nausea and vomiting, some abdominal distension  EXAM: PORTABLE ABDOMEN - 2 VIEW  COMPARISON:  CT abdomen pelvis of 11/26/2012  FINDINGS: There is gaseous distention of the stomach. However the remainder of the bowel is not distended and there is air seen to the rectum. Surgical clips are noted in the right upper quadrant. A left lateral decubitus of the abdomen shows no evidence of free intraperitoneal air.  IMPRESSION: 1. Gaseous distention of the stomach. 2. No other evidence of bowel distention is seen   Electronically Signed   By: Dwyane Dee M.D.   On: 12/02/2012 08:51    Medications / Allergies: per chart  Antibiotics: Anti-infectives   Start     Dose/Rate Route Frequency Ordered Stop   12/01/12 1045  ertapenem (INVANZ) 1 g in sodium chloride 0.9 % 50 mL IVPB     1 g 100 mL/hr over 30 Minutes Intravenous Every 24 hours 12/01/12 0937     11/26/12 2000  metroNIDAZOLE (FLAGYL) IVPB 500 mg  Status:  Discontinued     500 mg 100 mL/hr over 60 Minutes Intravenous Every 8 hours 11/26/12 1756 12/01/12 0937   11/26/12 1830  ciprofloxacin (CIPRO) IVPB 400 mg  Status:  Discontinued     400 mg 200 mL/hr over 60 Minutes Intravenous 2 times daily 11/26/12 1756 12/01/12 0937   11/26/12 1330  piperacillin-tazobactam (ZOSYN) IVPB 3.375 g  Status:  Discontinued     3.375 g 12.5 mL/hr over 240 Minutes Intravenous  Once 11/26/12 1323 11/26/12 1327   11/26/12 1330  ciprofloxacin (CIPRO) IVPB 400 mg     400 mg 200 mL/hr over 60 Minutes Intravenous  Once 11/26/12 1327 11/26/12 1620      Assessment/Plan Fib with  RVR Choledocholithiasis, holecystitis S/p Lap Chole POD #4  -Pt had a large BM, NGT has been off suction, unsure of duration.  DC NGT and start on clear liquid diet today -WBC is trending down, exhibits minimal abdominal tenderness on exam.  Hold off on CT scan.  Should WBC count or pain increase then we will rescan him to ensure there is not abscess. -Mobilize -IS -Cipro flagyl x5 days, then Invanz x2 days thus far  [employer], [employer] [facility] Pager [phone] Office [phone]  12/03/2012 8:04 AM

## 2012-12-03 NOTE — Progress Notes (Signed)
Physical Therapy Treatment Patient Details Name: [name] Page MRN: [id number] DOB: Sep 18, 1924 Today's Date: 12/03/2012 Time: 0254-2706 PT Time Calculation (min): 20 min  PT Assessment / Plan / Recommendation  History of Present Illness     PT Comments   PT not able to mobilize pt as pt's HR tachy (150's during bed mobility), BRB NG output, and waiting on imaging findings. Pt requested bedpan for bowel movement so PT assisted pt during rolling R and L for hygiene as pt had already went to the bathroom.  Pt would benefit from PT in order to improve functional mobility and safety.  Follow Up Recommendations  SNF     Does the patient have the potential to tolerate intense rehabilitation     Barriers to Discharge        Equipment Recommendations  None recommended by PT    Recommendations for Other Services    Frequency Min 3X/week   Progress towards PT Goals Progress towards PT goals: Not progressing toward goals - comment (Unable to mobilize pt as HR tachy & waiting imaging results.)  Plan Current plan remains appropriate    Precautions / Restrictions     Pertinent Vitals/Pain Pt reports chest pain but unable to rate but stated that he always has chest pain. PT limited treatment to bed mobility only as pt's HR was constantly in the 150's and blood in NG tube, so PT only assisted pt in bed mobility in order to perform hygiene as pt had bowel movement in bed.  RN notified of tachy.    Mobility       Exercises     PT Diagnosis:    PT Problem List:   PT Treatment Interventions:     PT Goals (current goals can now be found in the care plan section) Acute Rehab PT Goals Patient Stated Goal: Pt did not set PT Goal Formulation: Patient unable to participate in goal setting Time For Goal Achievement: 12/08/12 Potential to Achieve Goals: Fair  Visit Information       Subjective Data  Patient Stated Goal: Pt did not set   Cognition       Balance     End of Session PT -  End of Session Activity Tolerance: Patient limited by fatigue;Patient limited by pain Patient left: in bed;with call bell/phone within reach;with bed alarm set Nurse Communication: Other (comment) (RN notified HR tachy and catheter was off upon arrival.)   GP     [name] Page 12/03/2012, 2:10 PM

## 2012-12-03 NOTE — Progress Notes (Addendum)
Chart reviewed.  Pt seen and examined sbp 60; HR 170s; BRB in NG Ashen, pale Not alert, wife at [facility] abd soft, nt, nd. Extremities cool Getting 1u prbc now. Saline wide open  Pt is DNR. Wife refused cardioversion. Had no response to lopressor on floor.  No pressors or central access per family.   GI bleed - source unknown.  Npo NG tube to LIWS protonix gtt Transfuse 2u FFP after 2u prbc then check cbc, coags, DIC panel  And reassess Consult GI to discuss with wife - EGD; if wife declines upper endo - not sure if ongoing blood products make sense if don't try to treat source with therapeutic intervention  Prognosis very guarded - wife ok; offered wife assistance  Mary Sella. Andrey Campanile, MD, FACS General, Bariatric, & Minimally Invasive Surgery [facility], [place]

## 2012-12-03 NOTE — Progress Notes (Signed)
I was called to the patient's room by his nurse who was concerned that the patient had a significant amount of blood in his stool. She said that it was bright red blood. On arrival to the patient's room, Rapid Response had been called. The patient was unresponsive  not responding to a firm sternal rub or pain), pale, cool and clammy, and they were unable to get a blood pressure on him. He was in afib with a heart rate between 170-200. Per nursing, his NGT had bloody output earlier in the day but was now clamped. He also began having more bloody stools which was a new occurrence today. The NGT was unclamped and he began having bright red, blood-ttinged output from his NG.  1L of normal saline IVF was started and the patient began to become more responsive, opening his eyes. He remained in Afib with RVR with rates into the 200s. STAT CBC, CMP, and troponins were sent. The patient's blood pressure began to improve with the IVF.  1u pRBCs were transfused emergently as we were unsure of his Hgb level- prior to this episode it was 14.1 at 4:10am this morning. PCCM was called and saw the pt and recommended transfer to SDU after evaluation of the pt. General Surgery was notified as well.   The primary team was notified and arrived to the bedside prior to transfer to SDU.
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
## 2012-12-03 NOTE — Progress Notes (Signed)
I have reviewed this note and agree with all findings. Kati Hollis Tuller, PT, DPT Pager: [phone]   

## 2012-12-03 NOTE — Progress Notes (Signed)
Subjective: Patient seen at bedside. He is more comfortable this morning. He denies abdominal pain, but does still endorse some nausea. NG tube in place to intermittent suction. He had about 450cc total output yesterday, with another 50cc more this morning. Output in the collection tank is brown in color, however there is some bright red output in the proximal suction tubing. There is cherry jello on the clear liquid diet tray; nurse isn't sure if he ate some before she turned the suction back on.  Objective: Vital signs in last 24 hours: Filed Vitals:   12/02/12 1819 12/02/12 2227 12/03/12 0156 12/03/12 0541  BP: 115/92 129/98 138/98 138/98  Pulse: 123 128 119 118  Temp: 98.9 F (37.2 C) 97.6 F (36.4 C) 97.9 F (36.6 C) 97.8 F (36.6 C)  TempSrc: Oral Oral    Resp: 20 19 17 16   Height:      Weight:      SpO2: 95% 93% 95% 94%   Weight change:   Intake/Output Summary (Last 24 hours) at 12/03/12 0913 Last data filed at 12/03/12 0544  Gross per 24 hour  Intake      3 ml  Output    925 ml  Net   -922 ml   Physical Exam  Constitutional: He appears more comfortable than yesterday. Infrequent hiccupping. HENT:  Head: Normocephalic and atraumatic.  Patient is hard of hearing.  Eyes: Conjunctivae and EOM are normal. Pupils are equal, round, and reactive to light.  Neck: Normal range of motion. Neck supple.  Cardiovascular: S1 normal, S2 normal, normal heart sounds and intact distal pulses. An irregularly irregular rhythm present. Rate 110. Pulmonary/Chest: Effort normal and breath sounds normal. No respiratory distress. He has no wheezes. He has no rales. He exhibits no tenderness.  Abdominal: Bowel sounds present. No distention this morning. Winces with palpation of LLQ, no pain with palpation of RUQ. There is no rebound and no guarding. Trochar incision sites are clean, dry, intact. Musculoskeletal: No calf tenderness. No unilateral leg erythema, no swelling. Extremities warm and  well perfused. Neurological: He is alert and calm. Moving all extremities. Skin: Skin is warm and dry. Psychiatric: Affect normal.   Lab Results: Basic Metabolic Panel:  Recent Labs Lab 12/02/12 0345 12/03/12 0410  NA 130* 134*  K 3.4* 3.6  CL 95* 98  CO2 23 25  GLUCOSE 119* 105*  BUN 24* 39*  CREATININE 1.09 1.23  CALCIUM 8.5 8.6   Liver Function Tests:  Recent Labs Lab 11/30/12 0530 12/02/12 0345  AST 75* 37  ALT 29 23  ALKPHOS 71 102  BILITOT 0.9 0.8  PROT 5.2* 5.9*  ALBUMIN 2.2* 2.4*    Recent Labs Lab 11/26/12 0959  LIPASE 24   CBC:  Recent Labs Lab 11/26/12 0959  12/02/12 0345 12/03/12 0410  WBC 9.7  < > 16.3* 14.9*  NEUTROABS 8.2*  --   --  12.5*  HGB 14.4  < > 14.6 14.1  HCT 39.6  < > 39.5 39.2  MCV 97.5  < > 95.2 96.8  PLT 205  < > 206 229  < > = values in this interval not displayed. Cardiac Enzymes:  Recent Labs Lab 11/26/12 1920 11/27/12 0100 11/27/12 0830  TROPONINI <0.30 <0.30 <0.30   Coagulation:  Recent Labs Lab 11/27/12 0830  LABPROT 15.7*  INR 1.28   Urinalysis:  Recent Labs Lab 11/26/12 1022  COLORURINE YELLOW  LABSPEC 1.018  PHURINE 7.5  GLUCOSEU 100*  HGBUR NEGATIVE  BILIRUBINUR  NEGATIVE  KETONESUR NEGATIVE  PROTEINUR NEGATIVE  UROBILINOGEN 1.0  NITRITE NEGATIVE  LEUKOCYTESUR NEGATIVE    Micro Results: Recent Results (from the past 240 hour(s))  MRSA PCR SCREENING     Status: None   Collection Time    11/26/12  5:13 PM      Result Value Range Status   MRSA by PCR NEGATIVE  NEGATIVE Final   Comment:            The GeneXpert MRSA Assay (FDA     approved for NASAL specimens     only), is one component of a     comprehensive MRSA colonization     surveillance program. It is not     intended to diagnose MRSA     infection nor to guide or     monitor treatment for     MRSA infections.   Studies/Results: Dg Abd Portable 2v  12/02/2012   CLINICAL DATA:  Post lap cholecystectomy, nausea and  vomiting, some abdominal distension  EXAM: PORTABLE ABDOMEN - 2 VIEW  COMPARISON:  CT abdomen pelvis of 11/26/2012  FINDINGS: There is gaseous distention of the stomach. However the remainder of the bowel is not distended and there is air seen to the rectum. Surgical clips are noted in the right upper quadrant. A left lateral decubitus of the abdomen shows no evidence of free intraperitoneal air.  IMPRESSION: 1. Gaseous distention of the stomach. 2. No other evidence of bowel distention is seen   Electronically Signed   By: Dwyane Dee M.D.   On: 12/02/2012 08:51   Medications: I have reviewed the patient's current medications. Scheduled Meds: . antiseptic oral rinse  15 mL Mouth Rinse BID  . ertapenem  1 g Intravenous Q24H  . heparin  5,000 Units Subcutaneous Q8H  . metoprolol  100 mg Oral BID  . sodium chloride  3 mL Intravenous Q12H   Continuous Infusions:   PRN Meds:.HYDROcodone-acetaminophen, metoprolol, morphine injection, promethazine  Assessment/Plan: [name] Page is a 79 y.o. male PMH atrial fibrillation not on coumadin 2/2 PMH UGIB and SDH, HTN, CKD, recent hospital admission for AF with RVR and retained common bile duct stone with elevated LFTs requiring ERCP on 08/19/12, who presents as a transfer from [facility] with a chief complaint of abdominal pain, CT suggestive of cholecystitis.   #Acute cholecystitis s/p laparoscopic cholesystectomy, POD#4 - His post-operative ileus appears to be improving, though not completely resolved. Patient's abdominal pain and distention have gotten better s/p NG tube placement, but he does still endorse some nausea. Apparently he had a large, brown BM after I left the room, which is reassuring (and explains his tenderness to palpation in the LLQ on my exam). The bright red output in his proximal NG tube could be jello vs. BRB from NGT trauma when the suction was turned back on. Dr. Andrey Campanile flushed the tube and saw no additional blood. His leukocytosis  down trended this morning (16.3>14.9). He is afebrile. Surgical sites are clean, dry, intact. - Appreciate surgery recs - agree with plan for imaging given elevated WBC and history of a difficult gallbladder surgery, it's the safest approach - Follow up CT abdomen/pelvis with contrast - Continue IV ertapenem - NPO except meds  - Restarting IVF NS with KCl @75cc /hr - Continue NG tube with intermittent suction q4h - Starting IV Protonix - Up with assistance - PT recommended SNF, will place social work consult - Morphine prn pain  - Phenergen prn nausea  -  AM CBC  #Atrial fibrillation with rapid ventricular response - Rate in the 110s currently. BP stable. Patient transferred to telemetry bed in 6N. - Appreciate cardiology recs, they have signed off - Continue metoprolol 100mg  po BID - Prn metoprolol 2.5mg  IV q4h prn for HR>105 - Cardiac monitoring - Routine vital signs  #Right leg pain - Not complaining of it this morning. No calf tenderness, no unilateral swelling or erythema. Per chart review he has a history of right leg pain 2/2 Baker's cyst in his knee and atherosclerotic vascular disease, superficial femoral and popliteal on the right. - Continue to monitor  #DVT PPX - Subq heparin   Dispo: Disposition is deferred at this time, awaiting improvement of current medical problems.  Anticipated discharge in approximately 1-3 day(s).   The patient does have a current PCP (Vivi Barrack, MD) and does need an [facility] hospital follow-up appointment after discharge.  The patient does not have transportation limitations that hinder transportation to clinic appointments.  .Services Needed at time of discharge: Y = Yes, Blank = No PT:   OT:   RN:   Equipment:   Other:     LOS: 7 days   Vivi Barrack, MD 12/03/2012, 9:13 AM

## 2012-12-03 NOTE — Progress Notes (Signed)
Called by Dr. Shirlee Latch, re: patient currently DNR but HR 180-190s with low BP in the setting of blood loss. Due to code status, no DCCV can be performed. Recommended primary team to consider 500 ml IVF bolus prior to Amiodarone gtt without loading dose. Amio gtt to be decreased or stopped if BP doesn't tolerate.  Also address and treat underlying issues, such as hypovolemia and infection.   Haydee Salter, MD Cardiology Fellow

## 2012-12-03 NOTE — Significant Event (Signed)
Rapid Response Event Note  Overview: Called to assist with patient with large maroon stool and elevated HR Time Called: 1508 Arrival Time: 1515 Event Type: Other (Comment) (GIB)  Initial Focused Assessment:  On arrival pateint supine in bed.  Easily aroused - cool to touch - very pale - mucosa very dry.  Follows commands and answers questions.  Abd soft diffuse tenderness - NGT clamped - for oral contrast for CT scan - now placed on LWS - with BRB return.  Cannister shows 500 cc dark maroon drainage - RN Dwana Curd reports that was out this AM on day shift.  Large amount dark coffee ground looking stool with some red liquid stool.  HR 180 - RN Vera reports HR been 130-160 all day today - had treated with IV BB earlier.  BP 140/72 HR 180 RR 16 O2 sats 99% on RA.  While assessing patient he had sudden onset unresponsiveness and period of apnea.   BP recheck reveals 45/20 BP with HR 188 and O2 sat 70% with apnea.  Patient in full DNR per RN and chart.   Interventions:Stat NS bolus started.  Stat labs done.  Stat NRB mask applied.   Stat call to MD - IMTS.  Stimulated patient with voice and sternal rub.  With fluid bolus patient became more arousable.  BP climing to 88/45 HR remains 178.  RR 16 O2 sats 95%.  Dr. Criselda Peaches present.  Spoke with wife - she is on way back to hospital.  Dr. Janee Morn in.  GI consult called.  PCCM consult called.  Stat emergency release Onegative blood hung per MD order.  BP once again to 62/38.  Blood increased to 999 cc/hr per MD order.  BP 85/57.  HR 178.  Dr. Molli Knock in.  Spoke with wife and family and patient.  Patient to remain DNR with fluid and blood resuscitation only.  Transferred to 36S04 - wife with [place] - supported and questions answered. Bp 70/32 on arrival to SDU - MD aware.  Handoff to [employer]  Second unit emergency blood to be hung.  Has received 1500 cc NS infusion.  Patient remains arousable - abd remains tender to touch.     Event Summary: Name of Physician Notified: Dr.  Criselda Peaches at  (pta)  Name of Consulting Physician Notified: Dr. Molli Knock at 1545  Outcome: Transferred (Comment) [phone])  Event End Time: 1715  [place]

## 2012-12-03 NOTE — Progress Notes (Signed)
Chaplain responded to request from nurse to support pt's wife when she arrives. Pt is "critical." Upon arrival I spoke with nurse and learned pt is losing blood and his prognosis is not good. Pt's wife had arrived. I sat with her for 25-30 minutes, providing spiritual and emotional support and ministry of presence. She stated their pastor had been with them this afternoon. I assured pt's wife of chaplain availability throughout the night. She expressed appreciation for my visit.
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
## 2012-12-04 ENCOUNTER — Inpatient Hospital Stay (HOSPITAL_COMMUNITY): Payer: Medicare Other

## 2012-12-04 ENCOUNTER — Encounter (HOSPITAL_COMMUNITY)
Admission: EM | Disposition: A | Discharge status: Skilled Nursing Facility | Payer: Self-pay | Source: Home / Self Care | Attending: Internal Medicine

## 2012-12-04 DIAGNOSIS — K922 Gastrointestinal hemorrhage, unspecified: Secondary | ICD-10-CM

## 2012-12-04 LAB — CBC
HCT: 21.4 % — ABNORMAL LOW (ref 39.0–52.0)
HCT: 21.3 % — ABNORMAL LOW (ref 39.0–52.0)
Hemoglobin: 7.9 g/dL — ABNORMAL LOW (ref 13.0–17.0)
Hemoglobin: 7.9 g/dL — ABNORMAL LOW (ref 13.0–17.0)
Hemoglobin: 7.7 g/dL — ABNORMAL LOW (ref 13.0–17.0)
MCH: 33.5 pg (ref 26.0–34.0)
MCH: 32.6 pg (ref 26.0–34.0)
MCHC: 36 g/dL (ref 30.0–36.0)
MCHC: 36.7 g/dL — ABNORMAL HIGH (ref 30.0–36.0)
MCHC: 36.2 g/dL — ABNORMAL HIGH (ref 30.0–36.0)
MCV: 91.1 fL (ref 78.0–100.0)
MCV: 90.9 fL (ref 78.0–100.0)
Platelets: 139 10*3/uL — ABNORMAL LOW (ref 150–400)
Platelets: 147 10*3/uL — ABNORMAL LOW (ref 150–400)
Platelets: 150 10*3/uL (ref 150–400)
RBC: 2.38 MIL/uL — ABNORMAL LOW (ref 4.22–5.81)
RDW: 16 % — ABNORMAL HIGH (ref 11.5–15.5)
RDW: 16.6 % — ABNORMAL HIGH (ref 11.5–15.5)
RDW: 16.8 % — ABNORMAL HIGH (ref 11.5–15.5)
RDW: 16.7 % — ABNORMAL HIGH (ref 11.5–15.5)
WBC: 13.4 10*3/uL — ABNORMAL HIGH (ref 4.0–10.5)
WBC: 12.6 10*3/uL — ABNORMAL HIGH (ref 4.0–10.5)
WBC: 11.4 10*3/uL — ABNORMAL HIGH (ref 4.0–10.5)

## 2012-12-04 LAB — PREPARE FRESH FROZEN PLASMA
Unit division: 0
Unit division: 0

## 2012-12-04 LAB — BASIC METABOLIC PANEL
CO2: 17 mEq/L — ABNORMAL LOW (ref 19–32)
CO2: 16 mEq/L — ABNORMAL LOW (ref 19–32)
Calcium: 6.9 mg/dL — ABNORMAL LOW (ref 8.4–10.5)
Calcium: 7.1 mg/dL — ABNORMAL LOW (ref 8.4–10.5)
Creatinine, Ser: 1.41 mg/dL — ABNORMAL HIGH (ref 0.50–1.35)
Creatinine, Ser: 1.27 mg/dL (ref 0.50–1.35)
GFR calc non Af Amer: 44 mL/min — ABNORMAL LOW (ref >90)
GFR calc non Af Amer: 49 mL/min — ABNORMAL LOW (ref >90)
Glucose, Bld: 129 mg/dL — ABNORMAL HIGH (ref 70–99)
Sodium: 145 mEq/L (ref 135–145)

## 2012-12-04 LAB — DIC (DISSEMINATED INTRAVASCULAR COAGULATION) PANEL
INR: 1.36 (ref 0.00–1.49)
Prothrombin Time: 16.4 seconds — ABNORMAL HIGH (ref 11.6–15.2)

## 2012-12-04 LAB — LACTATE DEHYDROGENASE: LDH: 197 U/L (ref 94–250)

## 2012-12-04 LAB — DIC (DISSEMINATED INTRAVASCULAR COAGULATION)PANEL
D-Dimer, Quant: 3.59 ug/mL-FEU — ABNORMAL HIGH (ref 0.00–0.48)
Fibrinogen: 371 mg/dL (ref 204–475)
Platelets: 136 10*3/uL — ABNORMAL LOW (ref 150–400)
Smear Review: NONE SEEN
aPTT: 40 seconds — ABNORMAL HIGH (ref 24–37)

## 2012-12-04 LAB — PHOSPHORUS: Phosphorus: 2 mg/dL — ABNORMAL LOW (ref 2.3–4.6)

## 2012-12-04 LAB — PREPARE RBC (CROSSMATCH)

## 2012-12-04 SURGERY — EGD (ESOPHAGOGASTRODUODENOSCOPY)
Anesthesia: Moderate Sedation

## 2012-12-04 MED ORDER — POTASSIUM CHLORIDE IN NACL 20-0.9 MEQ/L-% IV SOLN
INTRAVENOUS | Status: AC
  Administered 2012-12-04: 75 mL/h via INTRAVENOUS
  Administered 2012-12-04: 150 mL/h via INTRAVENOUS
  Filled 2012-12-04 (×2): qty 1000

## 2012-12-04 MED ORDER — POTASSIUM CHLORIDE IN NACL 20-0.9 MEQ/L-% IV SOLN
INTRAVENOUS | Status: AC
  Administered 2012-12-04: via INTRAVENOUS
  Filled 2012-12-04: qty 1000

## 2012-12-04 MED ORDER — POTASSIUM CHLORIDE IN NACL 20-0.9 MEQ/L-% IV SOLN
INTRAVENOUS | Status: AC
  Filled 2012-12-04: qty 1000

## 2012-12-04 MED ORDER — DILTIAZEM HCL 100 MG IV SOLR
5.0000 mg/h | INTRAVENOUS | Status: DC
  Administered 2012-12-04: 5 mg/h via INTRAVENOUS
  Administered 2012-12-05 (×2): 15 mg/h via INTRAVENOUS
  Administered 2012-12-05: 10 mg/h via INTRAVENOUS
  Administered 2012-12-06: 15 mg/h via INTRAVENOUS
  Filled 2012-12-04 (×5): qty 100

## 2012-12-04 NOTE — Plan of Care (Signed)
Problem: Phase I Progression Outcomes Goal: OOB as tolerated unless otherwise ordered Outcome: Not Met (add Reason) Pt bed bound and end of life care

## 2012-12-04 NOTE — Consult Note (Signed)
PULMONARY  / CRITICAL CARE MEDICINE  Name: [name] Page MRN: [id number] DOB: [birth year]    ADMISSION DATE:  11/26/2012 CONSULTATION DATE:  12/03/2012  REFERRING MD :  Danise Edge PRIMARY SERVICE: IMTS  CHIEF COMPLAINT:  GI bleeding, a-fib with RVR and shock.  BRIEF PATIENT DESCRIPTION: 79 year old male with history of a-fib and GI bleeding presenting to the hospital for cholecystitis requiring lap chole.  Patient was in 6N when he developed an upper and lower GI bleeding with a-fib with RVR and associated shock.  Patient a full NCB and PCCM was called to assist with management.  SIGNIFICANT EVENTS / STUDIES:  9/23 GI bleeding, a-fib with RVR and shock.  LINES / TUBES: PIV  CULTURES: None  ANTIBIOTICS: None  SUBJECTIVE:  No chest pain.  GI planning EGD  VITAL SIGNS: Temp:  [97.6 F (36.4 C)-99.1 F (37.3 C)] 98.1 F (36.7 C) (09/24 0700) Pulse Rate:  [72-187] 150 (09/24 0600) Resp:  [14-33] 24 (09/24 0600) BP: (62-157)/(25-97) 111/89 mmHg (09/24 0600) SpO2:  [92 %-100 %] 100 % (09/24 0600) FiO2 (%):  [100 %] 100 % (09/24 0400) HEMODYNAMICS: HR 150 BP soft.  afib rvr   VENTILATOR SETTINGS: Vent Mode:  [-]  FiO2 (%):  [100 %] 100 % INTAKE / OUTPUT: Intake/Output     09/23 0701 - 09/24 0700 09/24 0701 - 09/25 0700   I.V. (mL/kg) 3042.5 (38.7)    Blood 672.5    IV Piggyback     Total Intake(mL/kg) 3715 (47.2)    Urine (mL/kg/hr) 300 (0.2)    Emesis/NG output 600 (0.3)    Total Output 900     Net +2815          Urine Occurrence 1 x    Stool Occurrence 6 x      PHYSICAL EXAMINATION: General:  Acutely on chronically ill appearing male, in no distress Neuro:  More alert HEENT:  [place]/AT, PERRL, EOM-I and very DMM. Cardiovascular:  IRIR HR 150, Nl S1/S2, -M/R/G  Lungs:  Distant BS but clear. Abdomen:  Soft, diffusely tender, ND and +BS. Musculoskeletal:  -edema and -tenderness. Skin:  Thin but intact.  LABS:  CBC Recent Labs     12/03/12  1546  12/03/12  2100   12/04/12  0610  WBC  16.4*  13.4*  12.9*  HGB  11.6*  7.7*  7.9*  HCT  32.2*  21.4*  21.5*  PLT  290  136*  139*  147*   Coag's Recent Labs     12/03/12  1546  12/03/12  2100  APTT  40*  40*  INR  1.42  1.36   BMET Recent Labs     12/03/12  0410  12/03/12  1546  12/04/12  0610  NA  134*  133*  145  K  3.6  3.5  3.8  CL  98  97  116*  CO2  25  20  17*  BUN  39*  45*  60*  CREATININE  1.23  1.35  1.41*  GLUCOSE  105*  160*  129*   Electrolytes Recent Labs     12/03/12  0410  12/03/12  1546  12/04/12  0610  CALCIUM  8.6  8.0*  6.9*  MG   --    --   1.8  PHOS   --    --   2.0*   Sepsis Markers No results found for this basename: LACTICACIDVEN, PROCALCITON, O2SATVEN,  in the last 72 hours  ABG No results found for this basename: PHART, PCO2ART, PO2ART,  in the last 72 hours Liver Enzymes Recent Labs     12/02/12  0345  12/03/12  1546  AST  37  30  ALT  23  17  ALKPHOS  102  101  BILITOT  0.8  0.5  ALBUMIN  2.4*  2.1*   Cardiac Enzymes No results found for this basename: TROPONINI, PROBNP,  in the last 72 hours Glucose No results found for this basename: GLUCAP,  in the last 72 hours  Imaging Dg Abd Portable 2v  12/04/2012   CLINICAL DATA:  GI bleed  EXAM: PORTABLE ABDOMEN - 2 VIEW  COMPARISON:  Abdomen films of 12/02/2012  FINDINGS: There has been improvement with less gaseous distention of the stomach after insertion of NG tube. The tip of the NG tube is in the region of the descending duodenum. No present bowel obstruction is noted and no free air is seen on the left lateral decubitus abdomen image.  IMPRESSION: Resolution of gastric distention of the stomach after placement of NG tube. No bowel obstruction. No free air.   Electronically Signed   By: Dwyane Dee M.D.   On: 12/04/2012 09:24     CXR: None  ASSESSMENT / PLAN:  PULMONARY A: hypoxic resp failure, better P:   - Titrate O2 as needed. - When stronger, pulmonary  hygienes.  CARDIOVASCULAR A: A-fib with RVR, HR in the 150  Mild shock P:  - No pressors or central access per family's request. - Full NCB. -transfuse to Hgb > 8.0 - cont  amiodarone for HR control (ideally would like to cardiovert but family refused it). - please ask cardiology to assist - No beta blockers or CCB. For now  - Transfer to SDU for monitoring but respect DNR request per family.  RENAL A:  No active issues.  But concern for developing renal failure given hypotension. P:   - BMET in AM.  GASTROINTESTINAL A:  GI bleeding. Prob Upper GI bleed. P:   -GI considering EGD - Protonix drip.  HEMATOLOGIC A:  GI bleeding. Gall stone /GB disease P:  - H&H q6 hours. - Type and screen then transfuse as needed (recommend target Hg of 8 given active bleeding). .  INFECTIOUS A:  No active issues. P:   - Monitor for aspiration given clinical exam.  ENDOCRINE A:  No history of DM.   P:   - Monitor. Marland Kitchen  NEUROLOGIC A:  Encephalopathic due to hypotension.>>improved P:   - monitor  TODAY'S SUMMARY: Acute GI bleeding (suspect upper tract) with hypotension, a-fib with RVR, full DNR.  Suggest transfuse another one unit PRBC, Coags are ok. Poss EGD today  I have personally obtained a history, examined the patient, evaluated laboratory and imaging results, formulated the assessment and plan and placed orders.  Dorcas Carrow Beeper  [phone]  Cell  [phone]  If no response or cell goes to voicemail, call beeper [phone]  12/04/2012, 9:41 AM

## 2012-12-04 NOTE — Progress Notes (Signed)
hgb stable overnight with no additional blood transfusion Started on amio gtt - Hr still in 160s  More awake and conversive. Denies abd pain cta b/l Tachy Soft, nt, nd  UGI - cont protonix gtt. Cont trending hgb at least q8h. Ok 1u prbc for hgb 7.9. If hgb drops again will need EGD Will decrease IVF from 150cc/hr Cont NG today. If no issues today with bleeding, will d/c in am  Mary Sella. Andrey Campanile, MD, FACS General, Bariatric, & Minimally Invasive Surgery [facility], [place]

## 2012-12-04 NOTE — Progress Notes (Addendum)
Subjective: Patient seen at bedside. He appears a little better this morning than he did last night. He is arousable. His extremities are warm and well perfused. HR 150s. BP 111/89. Satting 100% on NRB. He denies abdominal pain. On my first exam he appeared exquisitely tender to gentle palpation of the epigastric region, becoming startled, groaning, with PR increasing 20 bmps. However on repeat exam during team rounds he was more comfortable, though still wincing with palpation of RUQ.  Objective: Vital signs in last 24 hours: Filed Vitals:   12/04/12 0400 12/04/12 0500 12/04/12 0600 12/04/12 0700  BP: 86/57 103/74 111/89   Pulse: 123 72 150   Temp: 98.2 F (36.8 C) 99.1 F (37.3 C)  98.1 F (36.7 C)  TempSrc: Axillary Axillary  Axillary  Resp: 24 23 24    Height:      Weight:      SpO2: 100% 100% 100%    Weight change:   Intake/Output Summary (Last 24 hours) at 12/04/12 1105 Last data filed at 12/04/12 0700  Gross per 24 hour  Intake   3715 ml  Output    900 ml  Net   2815 ml   Physical Exam  Constitutional: Resting, no acute distress, arousable, NRB in place HENT:  Head: Normocephalic and atraumatic.  Patient is hard of hearing.  Eyes: Conjunctivae and EOM are normal. Pupils are equal, round, and reactive to light.  Neck: Normal range of motion. Neck supple.  Cardiovascular: S1 normal, S2 normal, normal heart sounds and intact distal pulses. An irregularly irregular rhythm present. Rate 150. Pulmonary/Chest: Effort normal and breath sounds normal. No respiratory distress. He has no wheezes. He has no rales. Abdominal: On initial exam, appeared exquisitely tender to palpation. When I touched the epigastric region gently, he groaned, jumped in bed, HR increased 20 bpm. But perhaps he was just startled at that time, because repeat exam on team rounds was more benign, with just wincing on palpation of RUQ. No bowel sounds heard. Musculoskeletal: No calf tenderness. No  unilateral leg erythema, no swelling. Extremities warm and well perfused. SCDs in place. Neurological: He is alert and calm. Moving all extremities. Psychiatric: Affect normal.   Lab Results: Basic Metabolic Panel:  Recent Labs Lab 12/03/12 1546 12/04/12 0610  NA 133* 145  K 3.5 3.8  CL 97 116*  CO2 20 17*  GLUCOSE 160* 129*  BUN 45* 60*  CREATININE 1.35 1.41*  CALCIUM 8.0* 6.9*  MG  --  1.8  PHOS  --  2.0*   Liver Function Tests:  Recent Labs Lab 12/02/12 0345 12/03/12 1546  AST 37 30  ALT 23 17  ALKPHOS 102 101  BILITOT 0.8 0.5  PROT 5.9* 5.5*  ALBUMIN 2.4* 2.1*   CBC:  Recent Labs Lab 12/03/12 0410  12/03/12 2100 12/04/12 0610  WBC 14.9*  < > 13.4* 12.9*  NEUTROABS 12.5*  --   --   --   HGB 14.1  < > 7.7* 7.9*  HCT 39.2  < > 21.4* 21.5*  MCV 96.8  < > 90.3 91.1  PLT 229  < > 136*  139* 147*  < > = values in this interval not displayed.  Coagulation:  Recent Labs Lab 12/03/12 1546 12/03/12 2100  LABPROT 17.0* 16.4*  INR 1.42 1.36   Micro Results: Recent Results (from the past 240 hour(s))  MRSA PCR SCREENING     Status: None   Collection Time    11/26/12  5:13 PM  Result Value Range Status   MRSA by PCR NEGATIVE  NEGATIVE Final   Comment:            The GeneXpert MRSA Assay (FDA     approved for NASAL specimens     only), is one component of a     comprehensive MRSA colonization     surveillance program. It is not     intended to diagnose MRSA     infection nor to guide or     monitor treatment for     MRSA infections.   Studies/Results: Dg Abd Portable 2v  12/04/2012   CLINICAL DATA:  GI bleed  EXAM: PORTABLE ABDOMEN - 2 VIEW  COMPARISON:  Abdomen films of 12/02/2012  FINDINGS: There has been improvement with less gaseous distention of the stomach after insertion of NG tube. The tip of the NG tube is in the region of the descending duodenum. No present bowel obstruction is noted and no free air is seen on the left lateral  decubitus abdomen image.  IMPRESSION: Resolution of gastric distention of the stomach after placement of NG tube. No bowel obstruction. No free air.   Electronically Signed   By: Dwyane Dee M.D.   On: 12/04/2012 09:24   Medications: I have reviewed the patient's current medications. Scheduled Meds: . antiseptic oral rinse  15 mL Mouth Rinse BID  . ertapenem  1 g Intravenous Q24H  . hydrocortisone sodium succinate  50 mg Intravenous Q6H  . [START ON 12/07/2012] pantoprazole (PROTONIX) IV  40 mg Intravenous Q12H  . sodium chloride  3 mL Intravenous Q12H   Continuous Infusions: . 0.9 % NaCl with KCl 20 mEq / L 75 mL/hr (12/04/12 1057)  . amiodarone (NEXTERONE PREMIX) 360 mg/200 mL dextrose 30 mg/hr (12/04/12 0600)  . pantoprozole (PROTONIX) infusion 8 mg/hr (12/04/12 0600)   PRN Meds:.morphine injection, promethazine, sodium chloride  Assessment/Plan: TERRYL MOLINELLI is a 79 y.o. male PMH atrial fibrillation not on coumadin 2/2 PMH UGIB and SDH, HTN, CKD, recent hospital admission for AF with RVR and retained common bile duct stone with elevated LFTs requiring ERCP on 08/19/12, who presents as a transfer from [facility] with a chief complaint of abdominal pain, CT suggestive of cholecystitis, patient is now POD#5 s/p lap chole.  #Acute GI bleed - Pt decompensated acutely yesterday around 3pm after having a large, bloody BM while getting ready for transfer to CT. He became hypotensive to as low as 45/20, tachycardic to the 180s, cool and clammy, unresponsive, there was bloody NGT output when placed back on suction. Patient is DNR per family with advanced directive in chart. He was given aggressive fluid resuscitation through peripheral IV (family does not want central line) with good response; patient became arousable, BP improved. Transferred to 3S. PCCM, GI, surgery, cardiology were all called. Subq heparin was discontinued. Family was extensively counseled on his critical state. Spiritual care  provided.  This morning he is s/p 2U trauma blood + 2U FFP. His Hgb was stable overnight at 7.9 (from 7.7 s/p blood products, overall decrease from 14.1 prior to the bleed). Our target Hgb is 8 given active bleeding. Azotemia present this morning (BUN 60). Corrected calcium is 8. Hemolysis labs pending. Cortisol pending. DIC labs s/p blood products and FFP show increased D-dimer (3.59), normal fibrinogen, normal INR.   He is currently on protonix gtt and stress dose steroids. Amiodarone gtt was also started this morning in the setting of persistent tachycardia after volume resuscitation,  concern for his AFib with RVR adding a cardiogenic component to his shock. (Per cards amiodarone is the most reasonable rate control agent given his hypotension.) We will down titrate this if his BP drops.   He appears more stable this morning; he is arousable, extremities warm, BP 110s/80s, satting well on NRB. Very little NGT output in last 8 hours: 200 cc bloody output recorded from 1900 to 0700, and only trace since then. I was initially concerned for an acute abdominal process such as perforated ulcer given his early abdominal exam, so ordered a stat 2-view abdominal x-ray, which showed no free air, decompressed stomach with NGT in place, no SBO.  - Appreciate very much PCCM, surgery, GI, and cardiology recs in this acutely ill patient - Will transfuse another 1U PRBC this am - GI to re-revaluate for possible EGD today, family is on board - Follow up cortisol, hemolysis labs - Continue amiodarone gtt; will closely monitor BP and titrate down as needed - Continue protonix gtt - Continue stress dose steroids until cortisol results - Continue Invanz - Continue NRB, titrate O2 as able - Backing down on IVF NS with KCl to avoid fluid overload, now @75cc /hr - Continue NG tube with intermittent suction q4h - Morphine prn pain - Phenergen prn nausea  - NPO - Follow up CBC, BMP after current blood products  in - DNR/DNI, no pressors or central access per family's wishes  #Atrial fibrillation with rapid ventricular response - S/p fluid and blood resuscitation and now on amiodarone gtt, rate is 130-150s. BP holding at 100-110s/70-80s. - As above, continue amiodarone gtt; will closely monitor BP and titrate down as needed - Will call cardiology today to discuss optimization of rate control - Avoiding BB, CCB - Cardiac monitoring  #Acute cholecystitis s/p laparoscopic cholesystectomy, POD#5 - His leukocytosis down trended to 12 this morning, but this is likely unreliable in setting of acute bleed. He is afebrile. Surgical sites are clean, dry, intact. - Will defer CT at this time given instability, better to pursue EGD if indicated given diagnostic + interventional potential  #DVT PPX - SCDs   Dispo: Disposition is deferred at this time, awaiting improvement of current medical problems.  Anticipated discharge in approximately 1-3 day(s).   The patient does have a current PCP (Vivi Barrack, MD) and does need an [facility] hospital follow-up appointment after discharge.  The patient does not have transportation limitations that hinder transportation to clinic appointments.  .Services Needed at time of discharge: Y = Yes, Blank = No PT:   OT:   RN:   Equipment:   Other:     LOS: 8 days   Vivi Barrack, MD 12/04/2012, 11:05 AM

## 2012-12-04 NOTE — Progress Notes (Signed)
Scan placed on hold 12/03/12 due to new onset of  bloody stools. Still on hold at 2200.  Spoke to Lacy,RN 12/04/12 at 6:20am. Rounding doctors to make decision about status of scan this morning.

## 2012-12-04 NOTE — Progress Notes (Signed)
[facility] Daily Rounding Note 12/04/2012, 8:38 AM  SUBJECTIVE:       Heart rate into 160s currently, this with Amiodarone drip in place.  On Protonix drip.  Had 3 dark, melenic stools yest evening through last night.  None so far this AM.  Pt feels some nausea.  Denies abdominal pain.  Very little NGT output in last 8 hours:  200 cc recorded from 1900 to 0700, and only trace output since then.   OBJECTIVE:         Vital signs in last 24 hours:    Temp:  [97.6 F (36.4 C)-99.1 F (37.3 C)] 98.1 F (36.7 C) (09/24 0700) Pulse Rate:  [72-187] 150 (09/24 0600) Resp:  [14-33] 24 (09/24 0600) BP: (62-157)/(25-97) 111/89 mmHg (09/24 0600) SpO2:  [92 %-100 %] 100 % (09/24 0600) FiO2 (%):  [100 %] 100 % (09/24 0400) Weight:  [78.7 kg (173 lb 8 oz)] 78.7 kg (173 lb 8 oz) (09/23 0840) Last BM Date: 12/03/12 General: looks  Better, less pale in face.  More alert   Heart: still  Tachy to 160s and irregular Chest: clear bil.  rebreather mask in place. Some labored breathing Abdomen: soft, very hypoactive BS, tender in RUQ.  No bleeding at incision dressings and no visible hematomas.   Extremities: feet warm, cap refill brisk.  Neuro/Psych:  Awakens easily, follows commands.  Not agitated.   Intake/Output from previous day: 09/23 0701 - 09/24 0700 In: 3715 [I.V.:3042.5; Blood:672.5] Out: 900 [Urine:300; Emesis/NG output:600]  Intake/Output this shift:    Lab Results:  Recent Labs  12/03/12 1546 12/03/12 2100 12/04/12 0610  WBC 16.4* 13.4* 12.9*  HGB 11.6* 7.7* 7.9*  HCT 32.2* 21.4* 21.5*  PLT 290 136*  139* 147*   BMET  Recent Labs  12/03/12 0410 12/03/12 1546 12/04/12 0610  NA 134* 133* 145  K 3.6 3.5 3.8  CL 98 97 116*  CO2 25 20 17*  GLUCOSE 105* 160* 129*  BUN 39* 45* 60*  CREATININE 1.23 1.35 1.41*  CALCIUM 8.6 8.0* 6.9*   LFT  Recent Labs  12/02/12 0345 12/03/12 1546  PROT 5.9* 5.5*  ALBUMIN 2.4* 2.1*  AST 37 30  ALT 23 17  ALKPHOS 102 101   BILITOT 0.8 0.5   PT/INR  Recent Labs  12/03/12 1546 12/03/12 2100  LABPROT 17.0* 16.4*  INR 1.42 1.36    Studies/Results:   ASSESMENT: * Blood per NGT, ? Trauma from NGT placement. ? Ulcer. Hx of NSAID induced ulcer in 2008.  Onn Protonix drip.  *  ABL anemia. S/p 2 PRBCs Yest PM *  Coagulopathy.  S/p 2 of FFP * Complicated Lap chole 11/29/12. Note that colon and mesentery all adherent to GB at time of surgery.  CT scan planned 9/23 was cancelled due to hemodynamic instability.  * Acute renal failure * Ileus post op.  * Afib with RVR. Heparin SQ discontinued 9/23. No thinners PTA due to hs ulcer bleed in 2008  * 08/2012 ERCP with sphincterotomy and stone extraction.  * General debilitation, in wheelchair at home  * NSAID induced ulcer and GI bleed in 2008.  Chronic home PPI and no NSAIDs.   PLAN: *  Consider transfusing another unit of PRBC *  Will check pt this afternoon to see re: to EGD or not.    LOS: 8 days   Jennye Moccasin  12/04/2012, 8:38 AM Pager: [phone]    Attending physician's note   I  have taken an interval history, reviewed the chart and examined the patient. I agree with the Advanced Practitioner's note, impression and recommendations. UGI bleeding appears to have stopped. He is too unstable from a cardiopulmonary standpoint for anything other than an emergent EGD for major, active bleeding. Transfuse as indicated. Continue PPI infusion. Will re-evalute for possible EGD later today or tomorrow.  Venita Lick. Russella Dar, MD [facility]

## 2012-12-04 NOTE — Progress Notes (Signed)
Patient Name: [name] Page Date of Encounter: 12/04/2012   Principal Problem:   Hemorrhagic shock Active Problems:   Hemorrhage of gastrointestinal tract, unspecified   Atrial fibrillation with RVR   Cholecystitis, acute   Acute encephalopathy   CKD (chronic kidney disease), stage III   Hearing impairment    SUBJECTIVE  No chest pain or sob.  Bloody bowel mvmt last night followed by hypotension and tachycardia.  After fluid bolus and prbc's, bp stabilized and amio started.  Rates better, though still elevated.  He's had more dark stools today.  Remains on nrb.  CURRENT MEDS . antiseptic oral rinse  15 mL Mouth Rinse BID  . ertapenem  1 g Intravenous Q24H  . hydrocortisone sodium succinate  50 mg Intravenous Q6H  . [START ON 12/07/2012] pantoprazole (PROTONIX) IV  40 mg Intravenous Q12H  . sodium chloride  3 mL Intravenous Q12H    OBJECTIVE  Filed Vitals:   12/04/12 1200 12/04/12 1240 12/04/12 1330 12/04/12 1445  BP:  119/81 116/81 119/73  Pulse:      Temp: 98.6 F (37 C) 98.3 F (36.8 C) 98.9 F (37.2 C) 98.7 F (37.1 C)  TempSrc:  Axillary Axillary Axillary  Resp:      Height:      Weight:      SpO2:  94% 92% 93%    Intake/Output Summary (Last 24 hours) at 12/04/12 1539 Last data filed at 12/04/12 1445  Gross per 24 hour  Intake 3922.5 ml  Output    400 ml  Net 3522.5 ml   Filed Weights   11/28/12 0423 11/29/12 0517 12/03/12 0840  Weight: 158 lb 4.6 oz (71.8 kg) 159 lb 1.6 oz (72.167 kg) 173 lb 8 oz (78.7 kg)    PHYSICAL EXAM  General: Pleasant, NAD. Neuro: Alert and oriented X 3. Moves all extremities spontaneously. Psych: Normal affect. HEENT:  Normal  Neck: Supple without bruits or JVD. Lungs:  Resp regular and unlabored, scatt rhonchi. Heart: ir, ir,  no s3, s4, or murmurs. Abdomen: Soft, non-tender, non-distended, BS + x 4.  Extremities: No clubbing, cyanosis or edema. DP/PT/Radials 2+ and equal bilaterally.  Accessory Clinical  Findings  CBC  Recent Labs  12/03/12 0410  12/03/12 2100 12/04/12 0610  WBC 14.9*  < > 13.4* 12.9*  NEUTROABS 12.5*  --   --   --   HGB 14.1  < > 7.7* 7.9*  HCT 39.2  < > 21.4* 21.5*  MCV 96.8  < > 90.3 91.1  PLT 229  < > 136*  139* 147*  < > = values in this interval not displayed. Basic Metabolic Panel  Recent Labs  12/03/12 1546 12/04/12 0610  NA 133* 145  K 3.5 3.8  CL 97 116*  CO2 20 17*  GLUCOSE 160* 129*  BUN 45* 60*  CREATININE 1.35 1.41*  CALCIUM 8.0* 6.9*  MG  --  1.8  PHOS  --  2.0*   Liver Function Tests  Recent Labs  12/02/12 0345 12/03/12 1546  AST 37 30  ALT 23 17  ALKPHOS 102 101  BILITOT 0.8 0.5  PROT 5.9* 5.5*  ALBUMIN 2.4* 2.1*   D-Dimer  Recent Labs  12/03/12 2100  DDIMER 3.59*   TELE  afib rvr, 130's.  Radiology/Studies  Dg Abd Portable 2v  12/04/2012   CLINICAL DATA:  GI bleed  EXAM: PORTABLE ABDOMEN - 2 VIEW  COMPARISON:  Abdomen films of 12/02/2012  FINDINGS: There has been improvement with  less gaseous distention of the stomach after insertion of NG tube. The tip of the NG tube is in the region of the descending duodenum. No present bowel obstruction is noted and no free air is seen on the left lateral decubitus abdomen image.  IMPRESSION: Resolution of gastric distention of the stomach after placement of NG tube. No bowel obstruction. No free air.   Electronically Signed   By: Dwyane Dee M.D.   On: 12/04/2012 09:24   ASSESSMENT AND PLAN  1.  GIB with hemorrhagic shock:  BP improved. S/p prbc's and IVF.  Hgb now stable @ 7.9 after dropping from 14.1 to 7.7 yesterday.  2.  Permanent Afib RVR:  Rates currently driven by underlying illness, anemia/hypoxia.  He was placed on amio last night - continue.  Dilt on hold in setting of relative hypotn.  Could consider renally dosed digoxin for further rate mgmt but biggest issue remains underlying causes.  3.  Cholecystitis:  S/p lap chole 9/19.  No pain.  Signed, Nicolasa Ducking NP  Patient seen with NP, agree with the above note.  Baseline is chronic rate-controlled atrial fibrillation.  Currently HR as high as the 150s in atrial fibrillation on amiodarone gtt.  SBP in the 110s.  Proximate cause of RVR is likely upper GI hemorrhage.  He is being transfused and is on Protonix gtt.  I will add low dose diltiazem gtt at 5 mg/hr.  If BP tolerates this, can titrate up.  Would like to keep HR < 120 bpm.   Marca Ancona 12/04/2012 4:02 PM
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
## 2012-12-04 NOTE — Progress Notes (Signed)
  Date: 12/04/2012  Patient name: [name] Page  Medical record number: [id number]  Date of birth: [birth year]   This patient has been seen and the plan of care was discussed with the house staff. Please see their note for complete details. I concur with their findings with the following additions/corrections:  Mr. Bolds is acutely ill at this time with likely GI bleeding given Hgb drop from 14 to ~ 7 today after acute event yesterday.  PCCM, Surgery, GI are following the patient along with [place].  He is DNR and does not want central line or pressors.  Management as per Dr. Doristine Locks note is ongoing.   Inez Catalina, MD 12/04/2012, 12:46 PM

## 2012-12-04 NOTE — Progress Notes (Signed)
Patient ID: [name] Page, male   DOB: 1924/05/18, 79 y.o.   MRN: [id number] 5 Days Post-Op  Subjective: Pt is somewhat awake, but has non-rebreather in place.  Son in the room.  Says patient is more comfortable this morning that last night.  Objective: Vital signs in last 24 hours: Temp:  [97.6 F (36.4 C)-99.1 F (37.3 C)] 98.1 F (36.7 C) (09/24 0700) Pulse Rate:  [72-187] 150 (09/24 0600) Resp:  [14-33] 24 (09/24 0600) BP: (62-157)/(25-97) 111/89 mmHg (09/24 0600) SpO2:  [92 %-100 %] 100 % (09/24 0600) FiO2 (%):  [100 %] 100 % (09/24 0400) Last BM Date: 12/03/12  Intake/Output from previous day: 09/23 0701 - 09/24 0700 In: 3715 [I.V.:3042.5; Blood:672.5] Out: 900 [Urine:300; Emesis/NG output:600] Intake/Output this shift:    PE: Abd: soft, NT, ND, NGT in place with old bloody drainage present, but NGT output is stable over night Heart: irregular, tachy  Lab Results:   Recent Labs  12/03/12 2100 12/04/12 0610  WBC 13.4* 12.9*  HGB 7.7* 7.9*  HCT 21.4* 21.5*  PLT 136*  139* 147*   BMET  Recent Labs  12/03/12 1546 12/04/12 0610  NA 133* 145  K 3.5 3.8  CL 97 116*  CO2 20 17*  GLUCOSE 160* 129*  BUN 45* 60*  CREATININE 1.35 1.41*  CALCIUM 8.0* 6.9*   PT/INR  Recent Labs  12/03/12 1546 12/03/12 2100  LABPROT 17.0* 16.4*  INR 1.42 1.36   CMP     Component Value Date/Time   NA 145 12/04/2012 0610   K 3.8 12/04/2012 0610   CL 116* 12/04/2012 0610   CO2 17* 12/04/2012 0610   GLUCOSE 129* 12/04/2012 0610   BUN 60* 12/04/2012 0610   CREATININE 1.41* 12/04/2012 0610   CREATININE 1.80* 10/01/2012 0949   CALCIUM 6.9* 12/04/2012 0610   PROT 5.5* 12/03/2012 1546   ALBUMIN 2.1* 12/03/2012 1546   AST 30 12/03/2012 1546   ALT 17 12/03/2012 1546   ALKPHOS 101 12/03/2012 1546   BILITOT 0.5 12/03/2012 1546   GFRNONAA 44* 12/04/2012 0610   GFRAA 50* 12/04/2012 0610   Lipase     Component Value Date/Time   LIPASE 24 11/26/2012 0959       Studies/Results: Dg  Abd Portable 2v  12/04/2012   CLINICAL DATA:  GI bleed  EXAM: PORTABLE ABDOMEN - 2 VIEW  COMPARISON:  Abdomen films of 12/02/2012  FINDINGS: There has been improvement with less gaseous distention of the stomach after insertion of NG tube. The tip of the NG tube is in the region of the descending duodenum. No present bowel obstruction is noted and no free air is seen on the left lateral decubitus abdomen image.  IMPRESSION: Resolution of gastric distention of the stomach after placement of NG tube. No bowel obstruction. No free air.   Electronically Signed   By: Dwyane Dee M.D.   On: 12/04/2012 09:24    Anti-infectives: Anti-infectives   Start     Dose/Rate Route Frequency Ordered Stop   12/01/12 1045  ertapenem (INVANZ) 1 g in sodium chloride 0.9 % 50 mL IVPB     1 g 100 mL/hr over 30 Minutes Intravenous Every 24 hours 12/01/12 0937     11/26/12 2000  metroNIDAZOLE (FLAGYL) IVPB 500 mg  Status:  Discontinued     500 mg 100 mL/hr over 60 Minutes Intravenous Every 8 hours 11/26/12 1756 12/01/12 0937   11/26/12 1830  ciprofloxacin (CIPRO) IVPB 400 mg  Status:  Discontinued  400 mg 200 mL/hr over 60 Minutes Intravenous 2 times daily 11/26/12 1756 12/01/12 0937   11/26/12 1330  piperacillin-tazobactam (ZOSYN) IVPB 3.375 g  Status:  Discontinued     3.375 g 12.5 mL/hr over 240 Minutes Intravenous  Once 11/26/12 1323 11/26/12 1327   11/26/12 1330  ciprofloxacin (CIPRO) IVPB 400 mg     400 mg 200 mL/hr over 60 Minutes Intravenous  Once 11/26/12 1327 11/26/12 1620       Assessment/Plan  1. S/p lap chole 2. UGI bleed 3. ABL anemia 4. Multiple medical problems  Plan: 1. Nothing surgical right now.  hgb is stable.  Bleeding appears to have stopped right now.  ? Need for NGT at this point, will d/w MD 2. Defer to GI on whether he needs an EGD or not 3. Further care per primary team.  Patient would not do well with any further aggressive intervention such as surgery.  The son seems to  understand this.   LOS: 8 days    Jayleigh Notarianni E 12/04/2012, 9:47 AM Pager: [phone]

## 2012-12-05 LAB — CBC
HCT: 22.1 % — ABNORMAL LOW (ref 39.0–52.0)
HCT: 22 % — ABNORMAL LOW (ref 39.0–52.0)
Hemoglobin: 7.9 g/dL — ABNORMAL LOW (ref 13.0–17.0)
MCH: 32.2 pg (ref 26.0–34.0)
MCH: 32 pg (ref 26.0–34.0)
MCV: 89.1 fL (ref 78.0–100.0)
Platelets: 176 10*3/uL (ref 150–400)
Platelets: 207 10*3/uL (ref 150–400)
RBC: 2.45 MIL/uL — ABNORMAL LOW (ref 4.22–5.81)
RBC: 2.47 MIL/uL — ABNORMAL LOW (ref 4.22–5.81)
WBC: 12.4 10*3/uL — ABNORMAL HIGH (ref 4.0–10.5)

## 2012-12-05 LAB — BASIC METABOLIC PANEL
BUN: 45 mg/dL — ABNORMAL HIGH (ref 6–23)
CO2: 19 mEq/L (ref 19–32)
Calcium: 7.7 mg/dL — ABNORMAL LOW (ref 8.4–10.5)
Creatinine, Ser: 1.09 mg/dL (ref 0.50–1.35)
GFR calc non Af Amer: 59 mL/min — ABNORMAL LOW (ref >90)
Glucose, Bld: 107 mg/dL — ABNORMAL HIGH (ref 70–99)

## 2012-12-05 LAB — MAGNESIUM: Magnesium: 1.9 mg/dL (ref 1.5–2.5)

## 2012-12-05 LAB — COMPREHENSIVE METABOLIC PANEL
ALT: 16 U/L (ref 0–53)
AST: 25 U/L (ref 0–37)
BUN: 48 mg/dL — ABNORMAL HIGH (ref 6–23)
CO2: 18 mEq/L — ABNORMAL LOW (ref 19–32)
Chloride: 116 mEq/L — ABNORMAL HIGH (ref 96–112)
GFR calc non Af Amer: 57 mL/min — ABNORMAL LOW (ref >90)
Potassium: 2.9 mEq/L — ABNORMAL LOW (ref 3.5–5.1)
Sodium: 144 mEq/L (ref 135–145)
Total Bilirubin: 0.4 mg/dL (ref 0.3–1.2)

## 2012-12-05 LAB — OCCULT BLOOD X 1 CARD TO LAB, STOOL: Fecal Occult Bld: POSITIVE — AB

## 2012-12-05 MED ORDER — POTASSIUM CHLORIDE 20 MEQ/15ML (10%) PO LIQD: 40.0000 meq | Freq: Once | ORAL | Status: DC

## 2012-12-05 MED ORDER — POTASSIUM CHLORIDE 20 MEQ/15ML (10%) PO LIQD
40.0000 meq | Freq: Two times a day (BID) | ORAL | Status: DC
  Administered 2012-12-05: 40 meq via ORAL
  Filled 2012-12-05 (×2): qty 30

## 2012-12-05 MED ORDER — POTASSIUM CHLORIDE CRYS ER 20 MEQ PO TBCR
20.0000 meq | EXTENDED_RELEASE_TABLET | Freq: Once | ORAL | Status: AC
  Administered 2012-12-05: 20 meq via ORAL
  Filled 2012-12-05: qty 1

## 2012-12-05 MED ORDER — PANTOPRAZOLE SODIUM 40 MG PO TBEC: 40.0000 mg | DELAYED_RELEASE_TABLET | Freq: Every day | ORAL | Status: DC

## 2012-12-05 MED ORDER — POTASSIUM CHLORIDE IN NACL 20-0.9 MEQ/L-% IV SOLN
INTRAVENOUS | Status: AC
  Administered 2012-12-05: 15:00:00 via INTRAVENOUS
  Filled 2012-12-05 (×2): qty 1000

## 2012-12-05 NOTE — Progress Notes (Signed)
Patient Name: [name] Page Date of Encounter: 12/05/2012   Principal Problem:   Hemorrhagic shock Active Problems:   CKD (chronic kidney disease), stage III   Hearing impairment   Atrial fibrillation with RVR   Cholecystitis, acute   Acute encephalopathy   Hemorrhage of gastrointestinal tract, unspecified    SUBJECTIVE  No chest pain or sob.  Melena yesterday.  CURRENT MEDS . antiseptic oral rinse  15 mL Mouth Rinse BID  . ertapenem  1 g Intravenous Q24H  . hydrocortisone sodium succinate  50 mg Intravenous Q6H  . [START ON 12/07/2012] pantoprazole (PROTONIX) IV  40 mg Intravenous Q12H  . sodium chloride  3 mL Intravenous Q12H    OBJECTIVE  Filed Vitals:   12/05/12 0500 12/05/12 0600 12/05/12 0700 12/05/12 0800  BP: 137/72 128/74 127/84 127/76  Pulse: 111 106  112  Temp:   97.7 F (36.5 C)   TempSrc:   Oral Oral  Resp: 22 17 22 18   Height:      Weight:      SpO2: 100% 96%  98%    Intake/Output Summary (Last 24 hours) at 12/05/12 0926 Last data filed at 12/05/12 0800  Gross per 24 hour  Intake 2280.5 ml  Output   1900 ml  Net  380.5 ml   Filed Weights   11/28/12 0423 11/29/12 0517 12/03/12 0840  Weight: 158 lb 4.6 oz (71.8 kg) 159 lb 1.6 oz (72.167 kg) 173 lb 8 oz (78.7 kg)    PHYSICAL EXAM  General: Pleasant, NAD. Neuro: Alert and oriented  HEENT:  Normal  Neck: Supple  Lungs:  CTA Heart: ir, ir,  no s3, s4, or murmurs. Abdomen: Soft, non-tender, non-distended, BS + x 4.  Extremities: No edema.   Accessory Clinical Findings  CBC  Recent Labs  12/03/12 0410  12/04/12 1632 12/04/12 2200  WBC 14.9*  < > 12.6* 11.4*  NEUTROABS 12.5*  --   --   --   HGB 14.1  < > 7.9* 7.7*  HCT 39.2  < > 22.0* 21.3*  MCV 96.8  < > 90.9 89.5  PLT 229  < > 150 153  < > = values in this interval not displayed. Basic Metabolic Panel  Recent Labs  12/03/12 1546 12/04/12 0610 12/04/12 1632  NA 133* 145 145  K 3.5 3.8 3.6  CL 97 116* 117*  CO2 20 17*  16*  GLUCOSE 160* 129* 115*  BUN 45* 60* 64*  CREATININE 1.35 1.41* 1.27  CALCIUM 8.0* 6.9* 7.1*  MG  --  1.8  --   PHOS  --  2.0*  --    Liver Function Tests  Recent Labs  12/03/12 1546  AST 30  ALT 17  ALKPHOS 101  BILITOT 0.5  PROT 5.5*  ALBUMIN 2.1*   D-Dimer  Recent Labs  12/03/12 2100  DDIMER 3.59*   TELE  afib rvr, 110 - 120's.  Radiology/Studies  Dg Abd Portable 2v  12/04/2012   CLINICAL DATA:  GI bleed  EXAM: PORTABLE ABDOMEN - 2 VIEW  COMPARISON:  Abdomen films of 12/02/2012  FINDINGS: There has been improvement with less gaseous distention of the stomach after insertion of NG tube. The tip of the NG tube is in the region of the descending duodenum. No present bowel obstruction is noted and no free air is seen on the left lateral decubitus abdomen image.  IMPRESSION: Resolution of gastric distention of the stomach after placement of NG tube. No bowel  obstruction. No free air.   Electronically Signed   By: Dwyane Dee M.D.   On: 12/04/2012 09:24   ASSESSMENT AND PLAN  1.  GIB with hemorrhagic shock:  BP improved. S/p prbc's and IVF.  Management per surgery, GI.  2.  Permanent Afib RVR:  Rates improved this AM; continue amiodarone and cardizem; no anticoagulation given GI bleed. Rate partially driven by anemia, GI bleed and recent surgery.  3.  Cholecystitis:  S/p lap chole 9/19.  No pain.  Signed, Olga Millers MD

## 2012-12-05 NOTE — Clinical Social Work Note (Signed)
Clinical Social Work Department BRIEF PSYCHOSOCIAL ASSESSMENT 12/05/2012  Patient:  [name] Page, [name] Page     Account Number:  [id number]     Admit date:  11/26/2012  Clinical Social Worker:  Hulan Fray  Date/Time:  12/05/2012 01:44 PM  Referred by:  CSW  Date Referred:  12/05/2012 Referred for  SNF Placement   Other Referral:   Interview type:  Patient Other interview type:   Wife- Laurine Blazer at bedside    PSYCHOSOCIAL DATA Living Status:  FAMILY Admitted from facility:   Level of care:   Primary support name:  Laurine Blazer Primary support relationship to patient:  SPOUSE Degree of support available:   supportive    CURRENT CONCERNS Current Concerns  Post-Acute Placement   Other Concerns:    SOCIAL WORK ASSESSMENT / PLAN Clinical Social Worker received referral for patient needing SNF placement at discharge. CSW introduced self and explained reason for visit. CSW provided SNF packet to wife. Wife reported that patient has previously been to [facility] and would prefer to return there if available. Wife reported that she already had the SNF list and returned it back to CSW. As CSW was completing assessment, MD came in to speak with patient and wife.    CSW will complete FL2 for MD's signature and update patient and wife when bed offers are made.   Assessment/plan status:  Psychosocial Support/Ongoing Assessment of Needs Other assessment/ plan:   Information/referral to community resources:   SNF packet offered and wife reported that she already had that paperwork.    PATIENT'S/FAMILY'S RESPONSE TO PLAN OF CARE: Patient and wife are agreeable for CSW to initiate SNF search in [facility]. Wife and patient were appreciative of CSW's visit and assistance

## 2012-12-05 NOTE — Progress Notes (Addendum)
Subjective: Patient seen at bedside. He is much improved. Mental status is at baseline, he is alert and oriented. Says he feels a lot better. He wants a bowl of Cheerios. Denies pain, nausea. He had ~500cc dark NGT output overnight. He had 1 bowel movement this morning, it was tarry. He is off the NRB mask and satting well on room air. NG tube was discontinued this morning per surgery, and he is starting a clear liquid diet. He says he tolerated a few sips of water.   Objective: Vital signs in last 24 hours: Filed Vitals:   12/05/12 0500 12/05/12 0600 12/05/12 0700 12/05/12 0800  BP: 137/72 128/74 127/84 127/76  Pulse: 111 106  112  Temp:   97.7 F (36.5 C)   TempSrc:   Oral Oral  Resp: 22 17 22 18   Height:      Weight:      SpO2: 100% 96%  98%   Weight change:   Intake/Output Summary (Last 24 hours) at 12/05/12 1017 Last data filed at 12/05/12 4782  Gross per 24 hour  Intake 2280.5 ml  Output   1901 ml  Net  379.5 ml   Physical Exam  Constitutional: Alert, no acute distress, in good spirits, joking with the team. HENT:  Head: Normocephalic and atraumatic.  Patient is hard of hearing.  Eyes: Conjunctivae and EOM are normal. Pupils are equal, round, and reactive to light.  Neck: Normal range of motion. Neck supple.  Cardiovascular: S1 normal, S2 normal, normal heart sounds and intact distal pulses. An irregularly irregular rhythm present. Rate 112. Pulmonary/Chest: Effort normal and breath sounds normal. No respiratory distress. He has no wheezes. He has no rales. Abdominal: Soft, non-tender, non-distended, bowel sound heard. Incisions clean, dry, intact. Musculoskeletal: Extremities warm and well perfused. SCDs in place. Neurological: He is alert and calm. Moving all extremities. Psychiatric: Affect normal.   Lab Results: Basic Metabolic Panel:  Recent Labs Lab 12/03/12 1546 12/04/12 0610 12/04/12 1632  NA 133* 145 145  K 3.5 3.8 3.6  CL 97 116* 117*  CO2 20 17*  16*  GLUCOSE 160* 129* 115*  BUN 45* 60* 64*  CREATININE 1.35 1.41* 1.27  CALCIUM 8.0* 6.9* 7.1*  MG  --  1.8  --   PHOS  --  2.0*  --    Liver Function Tests:  Recent Labs Lab 12/02/12 0345 12/03/12 1546  AST 37 30  ALT 23 17  ALKPHOS 102 101  BILITOT 0.8 0.5  PROT 5.9* 5.5*  ALBUMIN 2.4* 2.1*   CBC:  Recent Labs Lab 12/03/12 0410  12/04/12 1632 12/04/12 2200  WBC 14.9*  < > 12.6* 11.4*  NEUTROABS 12.5*  --   --   --   HGB 14.1  < > 7.9* 7.7*  HCT 39.2  < > 22.0* 21.3*  MCV 96.8  < > 90.9 89.5  PLT 229  < > 150 153  < > = values in this interval not displayed.  Coagulation:  Recent Labs Lab 12/03/12 1546 12/03/12 2100  LABPROT 17.0* 16.4*  INR 1.42 1.36   Micro Results: Recent Results (from the past 240 hour(s))  MRSA PCR SCREENING     Status: None   Collection Time    11/26/12  5:13 PM      Result Value Range Status   MRSA by PCR NEGATIVE  NEGATIVE Final   Comment:            The GeneXpert MRSA Assay (FDA  approved for NASAL specimens     only), is one component of a     comprehensive MRSA colonization     surveillance program. It is not     intended to diagnose MRSA     infection nor to guide or     monitor treatment for     MRSA infections.   Studies/Results: Dg Abd Portable 2v  12/04/2012   CLINICAL DATA:  GI bleed  EXAM: PORTABLE ABDOMEN - 2 VIEW  COMPARISON:  Abdomen films of 12/02/2012  FINDINGS: There has been improvement with less gaseous distention of the stomach after insertion of NG tube. The tip of the NG tube is in the region of the descending duodenum. No present bowel obstruction is noted and no free air is seen on the left lateral decubitus abdomen image.  IMPRESSION: Resolution of gastric distention of the stomach after placement of NG tube. No bowel obstruction. No free air.   Electronically Signed   By: Dwyane Dee M.D.   On: 12/04/2012 09:24   Medications: I have reviewed the patient's current medications. Scheduled Meds: .  antiseptic oral rinse  15 mL Mouth Rinse BID  . ertapenem  1 g Intravenous Q24H  . hydrocortisone sodium succinate  50 mg Intravenous Q6H  . [START ON 12/07/2012] pantoprazole (PROTONIX) IV  40 mg Intravenous Q12H  . sodium chloride  3 mL Intravenous Q12H   Continuous Infusions: . amiodarone (NEXTERONE PREMIX) 360 mg/200 mL dextrose 30 mg/hr (12/05/12 0600)  . diltiazem (CARDIZEM) infusion 10 mg/hr (12/05/12 0600)  . pantoprozole (PROTONIX) infusion 8 mg/hr (12/05/12 0629)   PRN Meds:.morphine injection, promethazine, sodium chloride  Assessment/Plan: MARVIS SAEFONG is a 79 y.o. male PMH atrial fibrillation not on coumadin 2/2 PMH UGIB and SDH, HTN, CKD, recent hospital admission for AF with RVR and retained common bile duct stone with elevated LFTs requiring ERCP on 08/19/12, who presents as a transfer from [facility] with a chief complaint of abdominal pain, CT suggestive of cholecystitis, patient is now POD#5 s/p lap chole.  #Acute GI bleed - Presumed UGIB from ulcer vs. NGT trauma. Patient clinical status much improved this morning. His vital signs are stable, afebrile, BP 120-130s systolic, HR 100-110s. GI bleeding appears to have slowed down significantly on Protonix gtt. He did not have an appropriate response to blood products given yesterday morning (Hgb 7.9 > 7.9 s/p 1 U), but his Hgb was stable overnight (7.9 s/p 1 U > 7.7 > 7.9 this am). He had ~500cc dark NGT output overnight and 1 tarry bowel movement this morning. GI to re-evaluate this morning, but likely will defer EGD for anything other than major, active bleeding in this patient. - Appreciate very much PCCM, surgery, GI recs - Discontinued NGT - Clear liquid diet - Holding IVF since tolerating po - Cortisol level 58; stopped stress dose steroids - Continue protonix gtt, will transition to po on Saturday per protocol - Morphine prn pain - Phenergen prn nausea  - Follow up am CMP (in process), afternoon CBC - DNR/DNI, no  pressors or central access per family's wishes - PT/OT  #Atrial fibrillation with rapid ventricular response - S/p fluid and blood resuscitation and now on amiodarone and diltiazem gtt, rate is 100-110s, BP 120-130s systolic. - Appreciate cardiology recs - Continue amiodarone gtt - Continue Cardizem gtt - Cardiac monitoring - No anticoagulation given acute GI bleed  #Acute cholecystitis s/p laparoscopic cholesystectomy, POD#6 - His leukocytosis has down trended to 12.4. He is afebrile.  Surgical sites are clean, dry, intact. - Continue Invanz - Will defer CT at this time given recent instability and improvement in leukocytosis  #DVT PPX - SCDs   Dispo: Disposition is deferred at this time, awaiting improvement of current medical problems.  Anticipated discharge in approximately 1-3 day(s).   The patient does have a current PCP (Vivi Barrack, MD) and does need an [facility] hospital follow-up appointment after discharge.  The patient does not have transportation limitations that hinder transportation to clinic appointments.  .Services Needed at time of discharge: Y = Yes, Blank = No PT:   OT:   RN:   Equipment:   Other:     LOS: 9 days   Vivi Barrack, MD 12/05/2012, 10:17 AM

## 2012-12-05 NOTE — Progress Notes (Signed)
[facility] Rounding Note  12/05/2012, 8:47 AM  SUBJECTIVE:       500 cc dark emesis per ngt overnight.  Surgery ordered ngt removed and start clears.  Heart rate in 90s to 120s this am. Dr Andrey Campanile has advised pt and family no need for EGD at this time.  No abdominal pain or nausea.  + flatus. Tarry, black stool at 0915 today.  Pt feels well and tolerating sips of clears  OBJECTIVE:         Vital signs in last 24 hours:    Temp:  [97.7 F (36.5 C)-98.9 F (37.2 C)] 97.7 F (36.5 C) (09/25 0700) Pulse Rate:  [97-155] 106 (09/25 0600) Resp:  [16-31] 17 (09/25 0600) BP: (105-146)/(67-131) 128/74 mmHg (09/25 0600) SpO2:  [92 %-100 %] 96 % (09/25 0600) FiO2 (%):  [100 %] 100 % (09/25 0400) Last BM Date: 12/04/12  General: looks better but still frail   Heart: RRR Chest: some wet sounding vocal quality and cough.  Abdomen: soft, ND, NT.  Active BS  Extremities: no CCE Neuro/Psych:  Pleasant, follows commands.  HOH  Intake/Output from previous day: 09/24 0701 - 09/25 0700 In: 2177.1 [I.V.:1777.1; Blood:400] Out: 1800 [Urine:1400; Emesis/NG output:400]  Intake/Output this shift:    Lab Results:  Recent Labs  12/04/12 0610 12/04/12 1632 12/04/12 2200  WBC 12.9* 12.6* 11.4*  HGB 7.9* 7.9* 7.7*  HCT 21.5* 22.0* 21.3*  PLT 147* 150 153   BMET  Recent Labs  12/03/12 1546 12/04/12 0610 12/04/12 1632  NA 133* 145 145  K 3.5 3.8 3.6  CL 97 116* 117*  CO2 20 17* 16*  GLUCOSE 160* 129* 115*  BUN 45* 60* 64*  CREATININE 1.35 1.41* 1.27  CALCIUM 8.0* 6.9* 7.1*   LFT  Recent Labs  12/03/12 1546  PROT 5.5*  ALBUMIN 2.1*  AST 30  ALT 17  ALKPHOS 101  BILITOT 0.5   PT/INR  Recent Labs  12/03/12 1546 12/03/12 2100  LABPROT 17.0* 16.4*  INR 1.42 1.36    Studies/Results: Dg Abd Portable 2v  12/04/2012   CLINICAL DATA:  GI bleed  EXAM: PORTABLE ABDOMEN - 2 VIEW  COMPARISON:  Abdomen films of 12/02/2012  FINDINGS: There has been improvement with less gaseous  distention of the stomach after insertion of NG tube. The tip of the NG tube is in the region of the descending duodenum. No present bowel obstruction is noted and no free air is seen on the left lateral decubitus abdomen image.  IMPRESSION: Resolution of gastric distention of the stomach after placement of NG tube. No bowel obstruction. No free air.   Electronically Signed   By: Dwyane Dee M.D.   On: 12/04/2012 09:24    ASSESMENT: * Blood per NGT, ? Trauma from NGT placement. ? Ulcer. Hx of NSAID induced ulcer in 2008. On Protonix drip.  * ABL anemia. S/p 2 PRBCs 9/23, one unit 9/24.  Hgb stable but did not rise in accordance with yesterday's blood. * Coagulopathy. S/p 2 of FFP  * Complicated lap chole 11/29/12. Note that colon and mesentery all adherent to GB at time of surgery. CT scan planned 9/23 was cancelled due to hemodynamic instability.  * Acute oliguric renal failure.  Urine out put improved  * Ileus post op. resolvedby xray yest am.  * Afib with RVR. Heparin SQ discontinued 9/23. No thinners PTA due to hs ulcer bleed in 2008  * 08/2012 ERCP with sphincterotomy and stone extraction.  *  General debilitation, in wheelchair at home  * NSAID induced ulcer and GI bleed in 2008. Chronic home PPI and no NSAIDs.    PLAN: *  Note plans to continue PPI drip through 9/27 then switch to BID IV.  At that point he ought to be able to take po Protonix so I will change the order.  *  GI available as needed.     LOS: 9 days   Jennye Moccasin  12/05/2012, 8:47 AM Pager: [phone]     Attending physician's note   I have taken an interval history, reviewed the chart and examined the patient. I agree with the Advanced Practitioner's note, impression and recommendations. Clinically he is improved. Bleeding appears to have stopped. CCS feels EGD not needed at this time. Avoid NSAIDs long term. Avoid anticoagulants for now. Would treat with a PPI infusion through 9/27 and then PPI BID long term. GI  available if needed.  Venita Lick. Russella Dar, MD [facility]

## 2012-12-05 NOTE — Progress Notes (Signed)
Utilization review completed.  

## 2012-12-05 NOTE — Progress Notes (Signed)
Patient ID: [name] Page, male   DOB: Jun 01, 1924, 79 y.o.   MRN: [id number] 6 Days Post-Op  Subjective: Pt feels well today.  Passing flatus.  No abdominal pain  Objective: Vital signs in last 24 hours: Temp:  [97.7 F (36.5 C)-98.9 F (37.2 C)] 97.7 F (36.5 C) (09/25 0700) Pulse Rate:  [97-155] 106 (09/25 0600) Resp:  [16-31] 17 (09/25 0600) BP: (105-146)/(67-131) 128/74 mmHg (09/25 0600) SpO2:  [92 %-100 %] 96 % (09/25 0600) FiO2 (%):  [100 %] 100 % (09/25 0400) Last BM Date: 12/04/12  Intake/Output from previous day: 09/24 0701 - 09/25 0700 In: 2177.1 [I.V.:1777.1; Blood:400] Out: 1800 [Urine:1400; Emesis/NG output:400] Intake/Output this shift:    PE: Abd: soft, NT, ND, +BS, NGT with minimal old bloody drainage, incisions c/d/i  Lab Results:   Recent Labs  12/04/12 1632 12/04/12 2200  WBC 12.6* 11.4*  HGB 7.9* 7.7*  HCT 22.0* 21.3*  PLT 150 153   BMET  Recent Labs  12/04/12 0610 12/04/12 1632  NA 145 145  K 3.8 3.6  CL 116* 117*  CO2 17* 16*  GLUCOSE 129* 115*  BUN 60* 64*  CREATININE 1.41* 1.27  CALCIUM 6.9* 7.1*   PT/INR  Recent Labs  12/03/12 1546 12/03/12 2100  LABPROT 17.0* 16.4*  INR 1.42 1.36   CMP     Component Value Date/Time   NA 145 12/04/2012 1632   K 3.6 12/04/2012 1632   CL 117* 12/04/2012 1632   CO2 16* 12/04/2012 1632   GLUCOSE 115* 12/04/2012 1632   BUN 64* 12/04/2012 1632   CREATININE 1.27 12/04/2012 1632   CREATININE 1.80* 10/01/2012 0949   CALCIUM 7.1* 12/04/2012 1632   PROT 5.5* 12/03/2012 1546   ALBUMIN 2.1* 12/03/2012 1546   AST 30 12/03/2012 1546   ALT 17 12/03/2012 1546   ALKPHOS 101 12/03/2012 1546   BILITOT 0.5 12/03/2012 1546   GFRNONAA 49* 12/04/2012 1632   GFRAA 57* 12/04/2012 1632   Lipase     Component Value Date/Time   LIPASE 24 11/26/2012 0959       Studies/Results: Dg Abd Portable 2v  12/04/2012   CLINICAL DATA:  GI bleed  EXAM: PORTABLE ABDOMEN - 2 VIEW  COMPARISON:  Abdomen films of 12/02/2012   FINDINGS: There has been improvement with less gaseous distention of the stomach after insertion of NG tube. The tip of the NG tube is in the region of the descending duodenum. No present bowel obstruction is noted and no free air is seen on the left lateral decubitus abdomen image.  IMPRESSION: Resolution of gastric distention of the stomach after placement of NG tube. No bowel obstruction. No free air.   Electronically Signed   By: Dwyane Dee M.D.   On: 12/04/2012 09:24    Anti-infectives: Anti-infectives   Start     Dose/Rate Route Frequency Ordered Stop   12/01/12 1045  ertapenem (INVANZ) 1 g in sodium chloride 0.9 % 50 mL IVPB     1 g 100 mL/hr over 30 Minutes Intravenous Every 24 hours 12/01/12 0937     11/26/12 2000  metroNIDAZOLE (FLAGYL) IVPB 500 mg  Status:  Discontinued     500 mg 100 mL/hr over 60 Minutes Intravenous Every 8 hours 11/26/12 1756 12/01/12 0937   11/26/12 1830  ciprofloxacin (CIPRO) IVPB 400 mg  Status:  Discontinued     400 mg 200 mL/hr over 60 Minutes Intravenous 2 times daily 11/26/12 1756 12/01/12 0937   11/26/12 1330  piperacillin-tazobactam (  ZOSYN) IVPB 3.375 g  Status:  Discontinued     3.375 g 12.5 mL/hr over 240 Minutes Intravenous  Once 11/26/12 1323 11/26/12 1327   11/26/12 1330  ciprofloxacin (CIPRO) IVPB 400 mg     400 mg 200 mL/hr over 60 Minutes Intravenous  Once 11/26/12 1327 11/26/12 1620       Assessment/Plan  1. S/p lap chole 2. UGI bleed, improved 3. ABL anemia  Plan: 1. Patient's hgb levels were stable yesterday.  One additional unit of blood was given to help elevate hgb levels.  No further evidence of active bleeding.  Will dc NGT today and give clear liquids.   LOS: 9 days    Manoah Deckard E 12/05/2012, 8:08 AM Pager: [phone]

## 2012-12-05 NOTE — Progress Notes (Signed)
  Date: 12/05/2012  Patient name: [name] Page  Medical record number: [id number]  Date of birth: [birth year]   This patient has been seen and the plan of care was discussed with the house staff. Please see their note for complete details. I concur with their findings with the following additions/corrections:  [name] much improved this morning.  Active issues include acute GI bleeding which does appear to be slowing down and Hgb has stabilized.  Also with Afib with RVR on cardizem and amiodarone drips with Cardiology following as well.   Inez Catalina, MD 12/05/2012, 2:27 PM

## 2012-12-05 NOTE — Progress Notes (Signed)
Didn't appear to respond to transfusion yesterday am but yesterday pm and am hgb stable Denies n/v/abd pain  Awake alert Soft, nt, nd  Appears UGI bleed has stopped D/c ng Can have clears At least daily hgb Once protonix gtt has completed protocol for bleed switch to bid IV protonix  No NSAIDS PT/OT  Mary Sella. Andrey Campanile, MD, FACS General, Bariatric, & Minimally Invasive Surgery [facility], [place]

## 2012-12-05 NOTE — Clinical Social Work Placement (Signed)
Clinical Social Work Department CLINICAL SOCIAL WORK PLACEMENT NOTE 12/05/2012  Patient:  [name] Page, [name] Page  Account Number:  [id number] Admit date:  11/26/2012  Clinical Social Worker:  Hulan Fray  Date/time:  12/05/2012 02:48 PM  Clinical Social Work is seeking post-discharge placement for this patient at the following level of care:   SKILLED NURSING   (*CSW will update this form in Epic as items are completed)   12/05/2012  Patient/family provided with [facility] Department of Clinical Social Work's list of facilities offering this level of care within the geographic area requested by the patient (or if unable, by the patient's family).  12/05/2012  Patient/family informed of their freedom to choose among providers that offer the needed level of care, that participate in Medicare, Medicaid or managed care program needed by the patient, have an available bed and are willing to accept the patient.  12/05/2012  Patient/family informed of [facility]' ownership interest in [facility], as well as of the fact that they are under no obligation to receive care at this facility.  PASARR submitted to EDS on 08/27/2012 PASARR number received from EDS on 08/27/2012  FL2 transmitted to all facilities in geographic area requested by pt/family on  12/05/2012 FL2 transmitted to all facilities within larger geographic area on   Patient informed that his/her managed care company has contracts with or will negotiate with  certain facilities, including the following:     Patient/family informed of bed offers received:   Patient chooses bed at  Physician recommends and patient chooses bed at    Patient to be transferred to  on   Patient to be transferred to facility by   The following physician request were entered in Epic:   Additional Comments:

## 2012-12-06 DIAGNOSIS — R0609 Other forms of dyspnea: Secondary | ICD-10-CM

## 2012-12-06 DIAGNOSIS — Z515 Encounter for palliative care: Secondary | ICD-10-CM

## 2012-12-06 DIAGNOSIS — R52 Pain, unspecified: Secondary | ICD-10-CM

## 2012-12-06 DIAGNOSIS — R0600 Dyspnea, unspecified: Secondary | ICD-10-CM

## 2012-12-06 LAB — BASIC METABOLIC PANEL
CO2: 18 mEq/L — ABNORMAL LOW (ref 19–32)
Calcium: 7.2 mg/dL — ABNORMAL LOW (ref 8.4–10.5)
Chloride: 120 mEq/L — ABNORMAL HIGH (ref 96–112)
Creatinine, Ser: 1.07 mg/dL (ref 0.50–1.35)
GFR calc non Af Amer: 61 mL/min — ABNORMAL LOW (ref >90)

## 2012-12-06 LAB — CBC
MCH: 32.3 pg (ref 26.0–34.0)
MCHC: 35.7 g/dL (ref 30.0–36.0)
MCV: 90.5 fL (ref 78.0–100.0)
Platelets: 196 10*3/uL (ref 150–400)
RBC: 1.89 MIL/uL — ABNORMAL LOW (ref 4.22–5.81)
RDW: 17 % — ABNORMAL HIGH (ref 11.5–15.5)

## 2012-12-06 MED ORDER — AMIODARONE HCL IN DEXTROSE 360-4.14 MG/200ML-% IV SOLN
30.0000 mg/h | INTRAVENOUS | Status: DC
  Filled 2012-12-06 (×2): qty 200

## 2012-12-06 MED ORDER — POTASSIUM CHLORIDE CRYS ER 20 MEQ PO TBCR
40.0000 meq | EXTENDED_RELEASE_TABLET | Freq: Once | ORAL | Status: AC
  Administered 2012-12-06: 40 meq via ORAL
  Filled 2012-12-06: qty 2

## 2012-12-06 MED ORDER — POTASSIUM CHLORIDE 20 MEQ/15ML (10%) PO LIQD: 40.0000 meq | Freq: Once | ORAL | Status: DC

## 2012-12-06 MED ORDER — DILTIAZEM HCL 100 MG IV SOLR
5.0000 mg/h | INTRAVENOUS | Status: DC
  Filled 2012-12-06: qty 100

## 2012-12-06 MED ORDER — BISACODYL 10 MG RE SUPP: 10.0000 mg | Freq: Every day | RECTAL | Status: DC | PRN

## 2012-12-06 MED ORDER — WHITE PETROLATUM GEL
Status: AC
  Filled 2012-12-06: qty 5

## 2012-12-06 MED ORDER — MORPHINE SULFATE 2 MG/ML IJ SOLN
1.0000 mg | INTRAMUSCULAR | Status: DC | PRN
  Administered 2012-12-06 – 2012-12-07 (×4): 1 mg via INTRAVENOUS
  Filled 2012-12-06 (×4): qty 1

## 2012-12-06 MED ORDER — LORAZEPAM 2 MG/ML IJ SOLN
1.0000 mg | INTRAMUSCULAR | Status: DC | PRN
  Administered 2012-12-06: 1 mg via INTRAVENOUS
  Filled 2012-12-06: qty 1

## 2012-12-06 NOTE — Progress Notes (Signed)
Subjective: Patient seen at bedside. Mental status is a baseline. His vital signs are stable, but he again has developed BRB and melena in his rectal tube. His Hgb dropped 2 points overnight. We discussed the next steps, and he is adamant that he does not want further interventions. He says he has had a good life and does not fear dying. He has expressed these wishes to his nurses, the surgery team, and my senior resident as well. If possible he would like to go home. He would rather be with his family than here in the hospital, alone, uncomfortable, and racking up a big bill.  The palliative care nurse came by this morning and met with the patient, his wife, and his son. All have agreed to comfort care only.  Objective: Vital signs in last 24 hours: Filed Vitals:   12/06/12 0400 12/06/12 0500 12/06/12 0600 12/06/12 0700  BP: 111/62 102/70 151/119 100/55  Pulse: 114 121 131 118  Temp: 97.9 F (36.6 C)   97.9 F (36.6 C)  TempSrc: Oral   Oral  Resp: 17 20 23 24   Height:      Weight:      SpO2: 98% 96% 98% 100%   Weight change:   Intake/Output Summary (Last 24 hours) at 12/06/12 0758 Last data filed at 12/06/12 0751  Gross per 24 hour  Intake 2327.92 ml  Output    976 ml  Net 1351.92 ml   Physical Exam  Constitutional: Alert, no acute distress, in good spirits, expressing wishes to transition to comfort care. HENT:  Head: Normocephalic and atraumatic.  Patient is hard of hearing.  Eyes: Conjunctivae and EOM are normal. Pupils are equal, round, and reactive to light.  Neck: Normal range of motion. Neck supple.  Cardiovascular: S1 normal, S2 normal, normal heart sounds and intact distal pulses. An irregularly irregular rhythm present. Rate 112. Pulmonary/Chest: Effort normal and breath sounds normal. No respiratory distress. He has no wheezes. He has no rales. Abdominal: Soft, non-tender, non-distended, bowel sound heard. Incisions clean, dry, intact. There is ~500cc maroon  stool in his rectal Foley bag. Musculoskeletal: Extremities warm and well perfused. SCDs in place. Neurological: He is alert and calm. Moving all extremities. Psychiatric: Affect normal.   Lab Results: Basic Metabolic Panel:  Recent Labs Lab 12/03/12 1546 12/04/12 0610  12/05/12 1731 12/05/12 1733 12/06/12 0405  NA 133* 145  < >  --  144 147*  K 3.5 3.8  < >  --  3.1* 3.2*  CL 97 116*  < >  --  115* 120*  CO2 20 17*  < >  --  19 18*  GLUCOSE 160* 129*  < >  --  107* 104*  BUN 45* 60*  < >  --  45* 42*  CREATININE 1.35 1.41*  < >  --  1.09 1.07  CALCIUM 8.0* 6.9*  < >  --  7.7* 7.2*  MG  --  1.8  --  1.9  --   --   PHOS  --  2.0*  --   --   --   --   < > = values in this interval not displayed. Liver Function Tests:  Recent Labs Lab 12/03/12 1546 12/05/12 1048  AST 30 25  ALT 17 16  ALKPHOS 101 64  BILITOT 0.5 0.4  PROT 5.5* 5.1*  ALBUMIN 2.1* 2.2*   CBC:  Recent Labs Lab 12/03/12 0410  12/05/12 1733 12/06/12 0405  WBC 14.9*  < >  14.9* 13.5*  NEUTROABS 12.5*  --   --   --   HGB 14.1  < > 7.9* 6.1*  HCT 39.2  < > 22.0* 17.1*  MCV 96.8  < > 89.1 90.5  PLT 229  < > 207 196  < > = values in this interval not displayed.  Coagulation:  Recent Labs Lab 12/03/12 1546 12/03/12 2100  LABPROT 17.0* 16.4*  INR 1.42 1.36   Micro Results: Recent Results (from the past 240 hour(s))  MRSA PCR SCREENING     Status: None   Collection Time    11/26/12  5:13 PM      Result Value Range Status   MRSA by PCR NEGATIVE  NEGATIVE Final   Comment:            The GeneXpert MRSA Assay (FDA     approved for NASAL specimens     only), is one component of a     comprehensive MRSA colonization     surveillance program. It is not     intended to diagnose MRSA     infection nor to guide or     monitor treatment for     MRSA infections.   Studies/Results: Dg Abd Portable 2v  12/04/2012   CLINICAL DATA:  GI bleed  EXAM: PORTABLE ABDOMEN - 2 VIEW  COMPARISON:  Abdomen  films of 12/02/2012  FINDINGS: There has been improvement with less gaseous distention of the stomach after insertion of NG tube. The tip of the NG tube is in the region of the descending duodenum. No present bowel obstruction is noted and no free air is seen on the left lateral decubitus abdomen image.  IMPRESSION: Resolution of gastric distention of the stomach after placement of NG tube. No bowel obstruction. No free air.   Electronically Signed   By: Dwyane Dee M.D.   On: 12/04/2012 09:24   Medications: I have reviewed the patient's current medications. Scheduled Meds: . antiseptic oral rinse  15 mL Mouth Rinse BID  . ertapenem  1 g Intravenous Q24H  . [START ON 12/07/2012] pantoprazole  40 mg Oral Daily  . sodium chloride  3 mL Intravenous Q12H   Continuous Infusions: . amiodarone (NEXTERONE PREMIX) 360 mg/200 mL dextrose 30.06 mg/hr (12/06/12 0700)  . diltiazem (CARDIZEM) infusion 15 mg/hr (12/06/12 0700)  . pantoprozole (PROTONIX) infusion 8 mg/hr (12/06/12 0700)   PRN Meds:.morphine injection, promethazine, sodium chloride  Assessment/Plan: [name] Page is a 79 y.o. male PMH atrial fibrillation not on coumadin 2/2 PMH UGIB and SDH, HTN, CKD, recent hospital admission for AF with RVR and retained common bile duct stone with elevated LFTs requiring ERCP on 08/19/12, who presents as a transfer from [facility] with a chief complaint of abdominal pain, CT suggestive of cholecystitis.  #Acute GI bleed - Presumed UGIB from ulcer vs. NGT trauma. His Hgb dropped overnight from 7.9 to 6.1. No hemamesis. His vital signs are stable, afebrile, BP 100-150s systolic, HR 110-120s. Patient and his family have decided to transition to comfort care. We will respect their wishes and stop all interventions, fluids, blood products, monitoring at this time. The family is at the bedside. [facility] has agreed very graciously to keep him for the immediate future. - Appreciate very much PCCM, surgery, GI, cardiology  recommendations and hard work - Comfort feeds - Comfort medications; morphine prn pain, ativan prn anxiety, phenergan prn nausea - Will discuss with the palliative team about going home if he stabilizes off all  medications  #Atrial fibrillation with rapid ventricular response - As the patient and his family want comfort care we have discontinued his rate-controlling drips and monitors.  #Acute cholecystitis s/p laparoscopic cholesystectomy, POD#7 - Nothing to do as patient is comfort care.  #DVT PPX - SCDs.   Dispo: Disposition is deferred at this time, awaiting improvement of current medical problems.  Anticipated discharge in approximately 1-3 day(s).   The patient does have a current PCP (Vivi Barrack, MD) and does need an [facility] hospital follow-up appointment after discharge.  The patient does not have transportation limitations that hinder transportation to clinic appointments.  .Services Needed at time of discharge: Y = Yes, Blank = No PT:   OT:   RN:   Equipment:   Other:     LOS: 10 days   Vivi Barrack, MD 12/06/2012, 7:58 AM

## 2012-12-06 NOTE — Progress Notes (Signed)
  Date: 12/06/2012  Patient name: [name] Page  Medical record number: [id number]  Date of birth: [birth year]   This patient has been seen and the plan of care was discussed with the house staff. Please see their note for complete details. I concur with their findings with the following additions/corrections:  Mr. Dini discussed with the team this morning his wishes and with the assistance of the palliative care team, he will be transitioned to comfort care only.  He has been having GI bleeding.  If this should stabilize, will plan to move to inpatient hospice.   Inez Catalina, MD 12/06/2012, 4:57 PM

## 2012-12-06 NOTE — Progress Notes (Signed)
Nutrition Brief Note  Chart reviewed. Patient now transitioning to comfort care.  No further nutrition interventions warranted at this time.  Please consult as needed.   Maureen Chatters, RD, LDN Pager #: [phone] After-Hours Pager #: [phone]

## 2012-12-06 NOTE — Progress Notes (Signed)
OT Cancellation Note  Patient Details Name: FERRY MATTHIS MRN: [id number] DOB: 10-Nov-1924   Cancelled Treatment:    Reason Eval/Treat Not Completed: Other (comment) Pt comfort care. Will sign off. [facility] Karisa Nesser, OTR/L  [phone] 12/06/2012 12/06/2012, 1:44 PM

## 2012-12-06 NOTE — Progress Notes (Signed)
Thank you for consulting the Palliative Medicine Team at [facility] to meet your patient's and family's needs.   The reason that you asked [place] to see your patient is goals of care, care options    We have scheduled your patient for a meeting: today Friday  The Surrogate decision maker is: spouse [name] Page h:[phone] c:[phone]  Other family members that need to be present: son [name] Page   Your patient is able to participate: on this visit patient alert- stated to Clinical research associate "everything they're doing isn't working -'m ready to say stop - he agrees to Clinical research associate calling his wife to find out what time his wife can be at the hospital *intially this meeting was to be at 1:30 today- however informed by staff RN Maggie, patient is making some changes and team looking for direction has spoken with wife who is on way to hospital PMT will try to see patient's wife when she arrives to hospital -Staff RN will call PMT provider when Ms Pullman arrives to room.   Valente David, RN 12/06/2012, 9:44 AM Palliative Medicine Team RN Liaison [phone]

## 2012-12-06 NOTE — Progress Notes (Signed)
Chart reviewed. Noted transition to comfort care only. IV drips stopped. Cardiology will sign off.   Elois Averitt [place] MD, [facility]
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
## 2012-12-06 NOTE — Progress Notes (Signed)
Agree with PA note  Mary Sella. Andrey Campanile, MD, FACS General, Bariatric, & Minimally Invasive Surgery [facility], [place]

## 2012-12-06 NOTE — Progress Notes (Signed)
PT Cancellation Note  Patient Details Name: [name] Page MRN: [id number] DOB: [birth year]   Cancelled Treatment:    Reason Eval/Treat Not Completed: Medical issues which prohibited therapy.  Will sign off as pt is comfort care.  Thanks.   INGOLD,Zaidee Rion 12/06/2012, 12:24 PM  [name] [phone] (pager)

## 2012-12-06 NOTE — Progress Notes (Signed)
Patient ID: [name] Page, male   DOB: 1924/08/18, 79 y.o.   MRN: [id number] 7 Days Post-Op  Subjective: Pt feels ok today; however he has started bleeding again.  He has a rectal foley in place with BRB and melena present.  No nausea.  No hematemesis   Objective: Vital signs in last 24 hours: Temp:  [97.3 F (36.3 C)-98.2 F (36.8 C)] 97.9 F (36.6 C) (09/26 0700) Pulse Rate:  [79-133] 119 (09/26 0800) Resp:  [15-27] 21 (09/26 0800) BP: (100-218)/(55-202) 118/60 mmHg (09/26 0917) SpO2:  [94 %-100 %] 98 % (09/26 0800) Last BM Date: 12/06/12 (loose watery black/reddish output)  Intake/Output from previous day: 09/25 0701 - 09/26 0700 In: 2327.9 [P.O.:240; I.V.:1937.9; IV Piggyback:150] Out: 951 [Urine:850; Emesis/NG output:100; Stool:1] Intake/Output this shift: Total I/O In: 113.4 [I.V.:113.4] Out: 25 [Urine:25]  PE: Abd: soft, Nt, ND, +BS, rectal foley with a combo of BRB and melenotic liquid stool present Heart: tachy, irregular  Lab Results:   Recent Labs  12/05/12 1733 12/06/12 0405  WBC 14.9* 13.5*  HGB 7.9* 6.1*  HCT 22.0* 17.1*  PLT 207 196   BMET  Recent Labs  12/05/12 1733 12/06/12 0405  NA 144 147*  K 3.1* 3.2*  CL 115* 120*  CO2 19 18*  GLUCOSE 107* 104*  BUN 45* 42*  CREATININE 1.09 1.07  CALCIUM 7.7* 7.2*   PT/INR  Recent Labs  12/03/12 1546 12/03/12 2100  LABPROT 17.0* 16.4*  INR 1.42 1.36   CMP     Component Value Date/Time   NA 147* 12/06/2012 0405   K 3.2* 12/06/2012 0405   CL 120* 12/06/2012 0405   CO2 18* 12/06/2012 0405   GLUCOSE 104* 12/06/2012 0405   BUN 42* 12/06/2012 0405   CREATININE 1.07 12/06/2012 0405   CREATININE 1.80* 10/01/2012 0949   CALCIUM 7.2* 12/06/2012 0405   PROT 5.1* 12/05/2012 1048   ALBUMIN 2.2* 12/05/2012 1048   AST 25 12/05/2012 1048   ALT 16 12/05/2012 1048   ALKPHOS 64 12/05/2012 1048   BILITOT 0.4 12/05/2012 1048   GFRNONAA 61* 12/06/2012 0405   GFRAA 70* 12/06/2012 0405   Lipase     Component Value  Date/Time   LIPASE 24 11/26/2012 0959       Studies/Results: No results found.  Anti-infectives: Anti-infectives   Start     Dose/Rate Route Frequency Ordered Stop   12/01/12 1045  ertapenem (INVANZ) 1 g in sodium chloride 0.9 % 50 mL IVPB     1 g 100 mL/hr over 30 Minutes Intravenous Every 24 hours 12/01/12 0937     11/26/12 2000  metroNIDAZOLE (FLAGYL) IVPB 500 mg  Status:  Discontinued     500 mg 100 mL/hr over 60 Minutes Intravenous Every 8 hours 11/26/12 1756 12/01/12 0937   11/26/12 1830  ciprofloxacin (CIPRO) IVPB 400 mg  Status:  Discontinued     400 mg 200 mL/hr over 60 Minutes Intravenous 2 times daily 11/26/12 1756 12/01/12 0937   11/26/12 1330  piperacillin-tazobactam (ZOSYN) IVPB 3.375 g  Status:  Discontinued     3.375 g 12.5 mL/hr over 240 Minutes Intravenous  Once 11/26/12 1323 11/26/12 1327   11/26/12 1330  ciprofloxacin (CIPRO) IVPB 400 mg     400 mg 200 mL/hr over 60 Minutes Intravenous  Once 11/26/12 1327 11/26/12 1620       Assessment/Plan  1. UGI bleed, unknown source 2. S/p lap chole  Plan: 1. Patient is likely actively bleeding.  His hgb has  dropped 2 grams in 11 hours.  he has a combo of BRB and melenotic stool present in rectal foley pouch currently.  BP is stable right now, but in a fib with RVR.  I have a d/w the patient about plan of care.  The patient does NOT want any aggressive care and wants to just be comfortable.  I have spoken with the wife, who at times has been more the aggressor to his care.  I have relayed his wishes to her.  She is on her way to the hospital to further discuss this with him to make decisions on care.  For now, I have spoken to the primary team and requested that his blood transfusion be held as comfort care is a very high probability for this patient right now.  If they choose more aggressive care, then the blood can still be given.  I have made him NPO  incase they choose aggressive care so that he could have an EGD to  locate a source.  Once again though, I suspect based off of what the patient has relayed to myself, Dr. Benjamin Stain, our resident, and Maggie the RN, that comfort care will likely be pursued.  Palliative care has also been updated and will try to meet with the patient and his wife upon her arrival.  LOS: 10 days    [name] Page 12/06/2012, 9:33 AM Pager: [phone]

## 2012-12-07 LAB — TYPE AND SCREEN
Antibody Screen: NEGATIVE
Unit division: 0
Unit division: 0
Unit division: 0
Unit division: 0

## 2012-12-07 NOTE — Progress Notes (Signed)
Subjective: The patient is in and out of lucidity but denies pain. Spoke with the family and they are still in agreement with the comfort care plan. The patient wants no further sticks or imaging or studies. He is still having some dark bowel movements. There are still traces of blood in the foley. He is not in acute distress.   Objective: Vital signs in last 24 hours: Filed Vitals:   12/06/12 1300 12/06/12 1400 12/06/12 2000 12/07/12 0800  BP:  111/63  128/99  Pulse:      Temp:    98.3 F (36.8 C)  TempSrc:    Oral  Resp: 25 26 24 13   Height:      Weight:      SpO2:       Weight change:   Intake/Output Summary (Last 24 hours) at 12/07/12 1008 Last data filed at 12/07/12 0805  Gross per 24 hour  Intake     40 ml  Output    750 ml  Net   -710 ml   General: resting in bed, no acute distress Cardiac: tachy, irregular Pulm: moving good air, no rales or ronchi Abd: soft, nontender, mildly distended with poor BS Ext: fingers slightly pale but warm, legs warm Neuro: intermittently alert with periods of hallucination without agitation  Lab Results: Basic Metabolic Panel:  Recent Labs Lab 12/03/12 1546 12/04/12 0610  12/05/12 1731 12/05/12 1733 12/06/12 0405  NA 133* 145  < >  --  144 147*  K 3.5 3.8  < >  --  3.1* 3.2*  CL 97 116*  < >  --  115* 120*  CO2 20 17*  < >  --  19 18*  GLUCOSE 160* 129*  < >  --  107* 104*  BUN 45* 60*  < >  --  45* 42*  CREATININE 1.35 1.41*  < >  --  1.09 1.07  CALCIUM 8.0* 6.9*  < >  --  7.7* 7.2*  MG  --  1.8  --  1.9  --   --   PHOS  --  2.0*  --   --   --   --   < > = values in this interval not displayed. CBC:  Recent Labs Lab 12/03/12 0410  12/05/12 1733 12/06/12 0405  WBC 14.9*  < > 14.9* 13.5*  NEUTROABS 12.5*  --   --   --   HGB 14.1  < > 7.9* 6.1*  HCT 39.2  < > 22.0* 17.1*  MCV 96.8  < > 89.1 90.5  PLT 229  < > 207 196  < > = values in this interval not displayed.  Medications: I have reviewed the patient's  current medications. Scheduled Meds: . antiseptic oral rinse  15 mL Mouth Rinse BID  . sodium chloride  3 mL Intravenous Q12H   Continuous Infusions:  PRN Meds:.bisacodyl, LORazepam, morphine injection, promethazine Assessment/Plan:  Hemorrhagic shock - S/P several units of PRBC and currently is comfort care. Last labs drawn with downtrending Hg and still with clinical signs of active bleeding. HR in 150s while I was in the room but patient without acute distress. Will continue to observe and if stable may try to move to inpatient hospice versus home.  -Morphine prn pain -Ativan prn anxiety or agitation -Promethazine prn nausea -Liquid diet for comfort feeding  Atrial fibrillation with RVR - Per patient and family request have discontinued amiodarone and diltiazem drips. His HR is currently in the  150s. Likely stressor for RVR is loss of blood.   CKD (chronic kidney disease), stage III - Not monitoring at this time but with 750 cc urine in last 24 hours.   Hearing impairment - Doing fairly well and does better with family members at understanding but able to communicate.      Cholecystitis, acute - S/P cholecystectomy and are monitoring. Was on ABX most recently invanz (D/C with comfort measures)  Dyspnea - Managing this symptom with ativan and morphine for comfort.   Dispo: Disposition is deferred at this time, awaiting improvement of current medical problems.  Anticipated discharge in approximately 1-2 day(s).   The patient does have a current PCP (Vivi Barrack, MD) and does not need an [facility] hospital follow-up appointment after discharge.  The patient does not have transportation limitations that hinder transportation to clinic appointments.  .Services Needed at time of discharge: Y = Yes, Blank = No PT:   OT:   RN:   Equipment:   Other:     LOS: 11 days   Judie Bonus, MD 12/07/2012, 10:08 AM

## 2012-12-08 DIAGNOSIS — H9190 Unspecified hearing loss, unspecified ear: Secondary | ICD-10-CM

## 2012-12-08 MED ORDER — PROMETHAZINE HCL 25 MG RE SUPP: 12.5000 mg | Freq: Four times a day (QID) | RECTAL | Status: DC | PRN

## 2012-12-08 MED ORDER — LORAZEPAM 0.5 MG PO TABS: 0.5000 mg | ORAL_TABLET | ORAL | Status: DC | PRN

## 2012-12-08 MED ORDER — MORPHINE SULFATE (CONCENTRATE) 10 MG /0.5 ML PO SOLN
5.0000 mg | ORAL | Status: DC | PRN
  Administered 2012-12-09 (×2): 5 mg via ORAL
  Filled 2012-12-08 (×2): qty 0.5

## 2012-12-08 NOTE — Progress Notes (Signed)
Subjective: Patient seen at the bedside. He is alert and oriented, in good spirits. No acute distress. Spoke with the son from [place] and they are still in agreement with the comfort care plan. The patient wants no further sticks or imaging or studies. There is normal brown output in the rectal Foley, no blood or melena today.  Objective: Vital signs in last 24 hours: Filed Vitals:   12/07/12 1200 12/07/12 1900 12/08/12 0040 12/08/12 0445  BP: 96/78 81/49 102/61 95/49  Pulse:  152    Temp:  98.3 F (36.8 C)    TempSrc:  Oral    Resp: 21 21 20 23   Height:      Weight:      SpO2:  98%     Weight change:   Intake/Output Summary (Last 24 hours) at 12/08/12 0753 Last data filed at 12/08/12 0617  Gross per 24 hour  Intake      0 ml  Output   1200 ml  Net  -1200 ml   General: resting in bed, no acute distress Cardiac: tachy, irregular Pulm: moving good air, no rales or ronchi Abd: soft, nontender, mildly distended with poor BS Ext: fingers slightly pale but warm, legs warm Neuro: alert and oriented  Lab Results: Basic Metabolic Panel:  Recent Labs Lab 12/03/12 1546 12/04/12 0610  12/05/12 1731 12/05/12 1733 12/06/12 0405  NA 133* 145  < >  --  144 147*  K 3.5 3.8  < >  --  3.1* 3.2*  CL 97 116*  < >  --  115* 120*  CO2 20 17*  < >  --  19 18*  GLUCOSE 160* 129*  < >  --  107* 104*  BUN 45* 60*  < >  --  45* 42*  CREATININE 1.35 1.41*  < >  --  1.09 1.07  CALCIUM 8.0* 6.9*  < >  --  7.7* 7.2*  MG  --  1.8  --  1.9  --   --   PHOS  --  2.0*  --   --   --   --   < > = values in this interval not displayed. CBC:  Recent Labs Lab 12/03/12 0410  12/05/12 1733 12/06/12 0405  WBC 14.9*  < > 14.9* 13.5*  NEUTROABS 12.5*  --   --   --   HGB 14.1  < > 7.9* 6.1*  HCT 39.2  < > 22.0* 17.1*  MCV 96.8  < > 89.1 90.5  PLT 229  < > 207 196  < > = values in this interval not displayed.  Medications: I have reviewed the patient's current medications. Scheduled  Meds: . antiseptic oral rinse  15 mL Mouth Rinse BID  . sodium chloride  3 mL Intravenous Q12H   Continuous Infusions:  PRN Meds:.bisacodyl, LORazepam, morphine injection, promethazine Assessment/Plan:  Hemorrhagic shock - S/P several units of PRBC and currently is comfort care. Last labs drawn with downtrending Hg and clinical signs of active bleeding (melena in Foley), but currently no signs of active bleeding. HR in 150s on exam, but patient without acute distress. Will continue to observe and if stable may try to move to inpatient hospice versus home.  - Morphine prn pain - Ativan prn anxiety or agitation - Promethazine prn nausea - Liquid diet for comfort feeding  Atrial fibrillation with RVR - Per patient and family request have discontinued amiodarone and diltiazem drips. His HR is currently in the 150s.  Likely stressor for RVR is loss of blood.   CKD (chronic kidney disease), stage III - Not monitoring at this time but with 975cc urine in last 24 hours.   Hearing impairment - Doing fairly well and does better with family members at understanding but able to communicate.   Cholecystitis, acute - S/P cholecystectomy and are monitoring. Was on ABX most recently invanz (D/C with comfort measures)  Dyspnea - Managing this symptom with ativan and morphine for comfort.   Dispo: Disposition is deferred at this time, awaiting improvement of current medical problems.  Anticipated discharge in approximately 1-2 day(s).   The patient does have a current PCP (Vivi Barrack, MD) and does not need an [facility] hospital follow-up appointment after discharge.  The patient does not have transportation limitations that hinder transportation to clinic appointments.  .Services Needed at time of discharge: Y = Yes, Blank = No PT:   OT:   RN:   Equipment:   Other:     LOS: 12 days   Vivi Barrack, MD 12/08/2012, 7:53 AM

## 2012-12-08 NOTE — Progress Notes (Signed)
Patient GM:WNUUV L Magallon      DOB: 1924/10/20      OZD:[id number]   Palliative Medicine Team at [facility] Progress Note    Subjective: Nursing reports IV is infiltrated.  Request alternative route of delivery for medications.  Patient using very little pain med and states not nauseated.  Currently, no pain.  Had some cheerios for breakfast.  Patient states in response to "I 'll see you in the morning..... If I am still here..."  Spouse is at the bedside, states this is the best he has been.  Heart rates had remained elevated.  Patient happy in no acute distress.  Spouse states if he needs to move they would consider [employer].  She wasn't sure her son would approve of hospice home.  We agreed to check with social worker in the am and talk over any concerns son might have.    Filed Vitals:   12/08/12 0800  BP: 111/78  Pulse:   Temp: 97.4 F (36.3 C)  Resp: 21   Physical exam:   General: Hard of hearing in no acute distress Pupils are equal round reactive to light extraocular muscles appear intact skin is pale Chest is decreased but clear to auscultation and do not appreciate rhonchi or or wheezes Cardiovascular is tachycardic irregular positive S1 and S2 Abdomen is slightly overweight nontender nondistended, I did not examine his incision site Extremities are warm but pale Neurologically he is awake alert oriented with decreased hearing   Assessment and plan: Patient is an 79 year old white male with a known past medical history of recent cholecystectomy. Patient's hospital course was complicated by an upper GI bleed requiring rectal tube, and exacerbation of his A. fib with RVR. Patient determined that he did not wish to have any further interventions understanding that he was potentially in a position to die from his current complications. Patient therefore elected comfort care. At this time he continues to anticipate his time of death however it appears that his  bleeding has stabilized. I discussed briefly the options for transition of care with his spouse. She states that she cannot take care of him at home and he will need to either be in the skilled nursing facility or residential hospice. She states her son is not keen on a residential hospice as his wife had been in one in he didn't feel that the care was up to par.   1. DO NOT RESUSCITATE DO NOT INTUBATE 2. No further transfusions or laboratory checks. 3. Melena: Appears to have improved. Continue to monitor without invasive testing 4. Comfort feeding by mouth as tolerated 5. Disposition: To be determined patient may stabilize. We will need to consider SNIF with [facility]CS versus residential hospice depending on his improvement or decline    Total time 20 minutes  Gregrey Bloyd L. Ladona Ridgel, MD MBA The Palliative Medicine Team at [facility] Phone: [phone] Pager: [phone]

## 2012-12-09 DIAGNOSIS — R5381 Other malaise: Secondary | ICD-10-CM

## 2012-12-09 DIAGNOSIS — R531 Weakness: Secondary | ICD-10-CM

## 2012-12-09 LAB — COMPREHENSIVE METABOLIC PANEL
ALT: 29 U/L (ref 0–53)
AST: 38 U/L — ABNORMAL HIGH (ref 0–37)
Alkaline Phosphatase: 134 U/L — ABNORMAL HIGH (ref 39–117)
BUN: 26 mg/dL — ABNORMAL HIGH (ref 6–23)
CO2: 21 mEq/L (ref 19–32)
Calcium: 7.5 mg/dL — ABNORMAL LOW (ref 8.4–10.5)
Chloride: 112 mEq/L (ref 96–112)
Creatinine, Ser: 1.17 mg/dL (ref 0.50–1.35)
GFR calc Af Amer: 63 mL/min — ABNORMAL LOW (ref >90)
GFR calc non Af Amer: 55 mL/min — ABNORMAL LOW (ref >90)
Glucose, Bld: 109 mg/dL — ABNORMAL HIGH (ref 70–99)
Potassium: 3.2 mEq/L — ABNORMAL LOW (ref 3.5–5.1)
Sodium: 143 mEq/L (ref 135–145)
Total Bilirubin: 0.7 mg/dL (ref 0.3–1.2)

## 2012-12-09 LAB — CBC
HCT: 18.7 % — ABNORMAL LOW (ref 39.0–52.0)
Hemoglobin: 6.2 g/dL — CL (ref 13.0–17.0)
MCH: 32.6 pg (ref 26.0–34.0)
Platelets: 208 10*3/uL (ref 150–400)
RBC: 1.9 MIL/uL — ABNORMAL LOW (ref 4.22–5.81)
WBC: 13 10*3/uL — ABNORMAL HIGH (ref 4.0–10.5)

## 2012-12-09 MED ORDER — PROMETHAZINE HCL 6.25 MG/5ML PO SYRP
12.5000 mg | ORAL_SOLUTION | Freq: Four times a day (QID) | ORAL | Status: DC | PRN
  Filled 2012-12-09: qty 10

## 2012-12-09 MED ORDER — METOPROLOL TARTRATE 50 MG PO TABS
50.0000 mg | ORAL_TABLET | Freq: Two times a day (BID) | ORAL | Status: DC
  Administered 2012-12-09: 50 mg via ORAL
  Filled 2012-12-09 (×4): qty 1

## 2012-12-09 NOTE — Progress Notes (Signed)
Patient ID: [name] Page, male   DOB: Sep 07, 1924, 79 y.o.   MRN: [id number] Patient without complaints.  Comfortable.  No surgical plans.  Palliative care.  Will sign off.  Evamarie Raetz E 8:14 AM 12/09/2012

## 2012-12-09 NOTE — Progress Notes (Signed)
Agree  Wilmon Arms. Corliss Skains, MD, [facility] Surgery  General/ Trauma Surgery  12/09/2012 9:28 AM

## 2012-12-09 NOTE — Progress Notes (Signed)
  Date: 12/09/2012  Patient name: [name] Page  Medical record number: [id number]  Date of birth: [birth year]   This patient has been seen and the plan of care was discussed with the house staff. Please see their note for complete details. I concur with their findings.  Plan for likely d/c to SNF with palliative care due to patients wishes.  Adding back metoprolol today for comfort given persistent elevated HR.   Inez Catalina, MD 12/09/2012, 11:56 AM

## 2012-12-09 NOTE — Progress Notes (Signed)
Progress Note from the Palliative Medicine Team at [facility]  Subjective: patient is alert and oriented but very weak  -wife at bedside, continued conversation regarding GOC and options  --focus is full comfort, patient and wife are hopeful for residential hospice.  I believe this patient is eligible with a prognosis of less than 4-6 weeks.  Labs today reflect an elevated white count (no antibiotics to be used), hemoglobin 6.2 (no further transfusions), hypo albuminia (albumin 2.2), poor po intake and remains tachycardic/a-fib and has  a PPS of 30 % at best.     Objective: Allergies  Allergen Reactions  . Oxycodone     Facial redness  . Penicillins Hives and Rash   Scheduled Meds: . antiseptic oral rinse  15 mL Mouth Rinse BID  . metoprolol  50 mg Oral BID   Continuous Infusions:  PRN Meds:.bisacodyl, LORazepam, morphine CONCENTRATE, promethazine  BP 114/67  Pulse 158  Temp(Src) 98.3 F (36.8 C) (Oral)  Resp 22  Ht 6\' 1"  (1.854 m)  Wt 78.7 kg (173 lb 8 oz)  BMI 22.9 kg/m2  SpO2 97%   PPS:30 %  Pain Score:denies   Intake/Output Summary (Last 24 hours) at 12/09/12 1511 Last data filed at 12/09/12 0739  Gross per 24 hour  Intake      0 ml  Output    205 ml  Net   -205 ml      WJX:BJYNWG tube, scant bloody stool noted     Physical Exam:   General: chronically ill appearing, NAD  HEENT: moist  buccal membranes, no exudate noted, no dentation  Chest: Decreased in bases, scattered coarse BS  CVS: tachycardic  Abdomen: soft NT +BS  Ext: without edema  Neuro: alert and oreinted X3   abs: CBC    Component Value Date/Time   WBC 13.0* 12/09/2012 1130   RBC 1.90* 12/09/2012 1130   HGB 6.2* 12/09/2012 1130   HCT 18.7* 12/09/2012 1130   PLT 208 12/09/2012 1130   MCV 98.4 12/09/2012 1130   MCH 32.6 12/09/2012 1130   MCHC 33.2 12/09/2012 1130   RDW 20.5* 12/09/2012 1130   LYMPHSABS 1.2 12/03/2012 0410   MONOABS 1.2* 12/03/2012 0410   EOSABS 0.0 12/03/2012 0410   BASOSABS 0.0 12/03/2012 0410    BMET    Component Value Date/Time   NA 143 12/09/2012 1130   K 3.2* 12/09/2012 1130   CL 112 12/09/2012 1130   CO2 21 12/09/2012 1130   GLUCOSE 109* 12/09/2012 1130   BUN 26* 12/09/2012 1130   CREATININE 1.17 12/09/2012 1130   CREATININE 1.80* 10/01/2012 0949   CALCIUM 7.5* 12/09/2012 1130   GFRNONAA 55* 12/09/2012 1130   GFRAA 63* 12/09/2012 1130    CMP     Component Value Date/Time   NA 143 12/09/2012 1130   K 3.2* 12/09/2012 1130   CL 112 12/09/2012 1130   CO2 21 12/09/2012 1130   GLUCOSE 109* 12/09/2012 1130   BUN 26* 12/09/2012 1130   CREATININE 1.17 12/09/2012 1130   CREATININE 1.80* 10/01/2012 0949   CALCIUM 7.5* 12/09/2012 1130   PROT 5.1* 12/09/2012 1130   ALBUMIN 2.2* 12/09/2012 1130   AST 38* 12/09/2012 1130   ALT 29 12/09/2012 1130   ALKPHOS 134* 12/09/2012 1130   BILITOT 0.7 12/09/2012 1130   GFRNONAA 55* 12/09/2012 1130   GFRAA 63* 12/09/2012 1130      Assessment and Plan: 1. Code Status: DNR/DNI-comfort is main focus of care 2. Symptom Control: 1. Anxiety/Agitation:  Ativan 1 mg IV every 4 hrs prn 2. Pain/Dyspnea: Morphine 1 mg IV every 1 hr prn 3. Bowel Regimen: Dulcolax supp prn  3. Psycho/Social: Emotional support offered to  Patient and wife at bedside, both are comfortable with full comfort appraoch 4. Spiritual  Strong community church support 5. Disposition:  Hopeful for residential hospice, will write for choice.  Patient Documents Completed or Given: Document Given Completed  Advanced Directives Pkt    MOST    DNR    Gone from My Sight    Hard Choices yes     Time In Time Out Total Time Spent with Patient Total Overall Time  1500 1535 35 min 35 min    Greater than 50%  of this time was spent counseling and coordinating care related to the above assessment and plan.  Lorinda Creed NP  Palliative Medicine Team Team Phone # [phone] Pager [phone]  Discussed with Dr Claudell Kyle 1

## 2012-12-09 NOTE — Clinical Social Work Note (Signed)
Clinical Social Worker received new referral for residential hospice placement. CSW met with patient and wife at bedside. CSW provided residential hospice list and wife reported that she preferred [facility]. CSW encouraged wife to have alternative options, just in case their preferred facility was not available. Wife initially was hesitant, but after CSW explained why, wife was agreeable for CSW to send referrals to [facility] and [facility] locations. CSW explained that SNF option would mean private pay and wife reported that she could not afford that. CSW notified [employer] of referral and will initiate [place] and [facility] referrals as well.   Rozetta Nunnery MSW, [employer] [phone]

## 2012-12-09 NOTE — Progress Notes (Signed)
Subjective: Patient seen at the bedside. He is alert and oriented, in good spirits. No acute distress. He was able to eat some breakfast. Small amount of brownish-green stool in rectal foley. Denies pain. He is amenable to residential hospice vs. SNF with hospice.   Objective: Vital signs in last 24 hours: Filed Vitals:   12/08/12 0445 12/08/12 0800 12/08/12 1911 12/09/12 0700  BP: 95/49 111/78 114/67   Pulse:   158   Temp:  97.4 F (36.3 C) 99.1 F (37.3 C) 98.3 F (36.8 C)  TempSrc:  Oral Oral Oral  Resp: 23 21 22    Height:      Weight:      SpO2:   97%    Weight change:   Intake/Output Summary (Last 24 hours) at 12/09/12 0819 Last data filed at 12/09/12 0739  Gross per 24 hour  Intake      0 ml  Output    205 ml  Net   -205 ml   General: resting in bed, no acute distress Cardiac: tachy, irregular Pulm: moving good air, no rales or ronchi Abd: soft, nontender, mildly distended with poor BS Ext: fingers slightly pale but warm, legs warm Neuro: alert and oriented  Lab Results: Basic Metabolic Panel:  Recent Labs Lab 12/03/12 1546 12/04/12 0610  12/05/12 1731 12/05/12 1733 12/06/12 0405  NA 133* 145  < >  --  144 147*  K 3.5 3.8  < >  --  3.1* 3.2*  CL 97 116*  < >  --  115* 120*  CO2 20 17*  < >  --  19 18*  GLUCOSE 160* 129*  < >  --  107* 104*  BUN 45* 60*  < >  --  45* 42*  CREATININE 1.35 1.41*  < >  --  1.09 1.07  CALCIUM 8.0* 6.9*  < >  --  7.7* 7.2*  MG  --  1.8  --  1.9  --   --   PHOS  --  2.0*  --   --   --   --   < > = values in this interval not displayed. CBC:  Recent Labs Lab 12/03/12 0410  12/05/12 1733 12/06/12 0405  WBC 14.9*  < > 14.9* 13.5*  NEUTROABS 12.5*  --   --   --   HGB 14.1  < > 7.9* 6.1*  HCT 39.2  < > 22.0* 17.1*  MCV 96.8  < > 89.1 90.5  PLT 229  < > 207 196  < > = values in this interval not displayed.  Medications: I have reviewed the patient's current medications. Scheduled Meds: . antiseptic oral rinse  15  mL Mouth Rinse BID   Continuous Infusions:  PRN Meds:.bisacodyl, LORazepam, morphine CONCENTRATE, promethazine Assessment/Plan:  Hemorrhagic shock - S/P several units of PRBC and currently is comfort care only. Last labs drawn with downtrending Hg and signs of active bleeding (melena in Foley), but his bleeding appears to have stopped over the last 48 hours. HR in 150s on exam, but patient without acute distress. Per palliative care's recommendations, we have ordered a CMP and CBC to triage for [facility] vs. SNF with hospice.  - Appreciate palliative recs - Morphine po prn pain - Ativan prn anxiety or agitation - Promethazine syrup prn nausea - Regular diet for comfort feeding - Follow up CBC, CMP - Follow up with social work and palliative re: placement in residential hospice vs. SNF vs. home, he  is stable for discharge whenever the decision is made  Atrial fibrillation with RVR - Per patient and family request have discontinued amiodarone and diltiazem drips. His HR is currently in the 150s. Likely stressor for RVR is loss of blood.  - Added back home metoprolol po for comfort and to aid in placement  CKD (chronic kidney disease), stage III - Not monitoring at this time.   Hearing impairment - Doing fairly well and does better with family members at understanding but able to communicate.   Cholecystitis, acute - S/P cholecystectomy and are monitoring. Was on ABX most recently invanz (D/C with comfort measures).  Dyspnea - Managing this symptom with ativan and morphine for comfort.   Dispo: Disposition is deferred at this time, awaiting improvement of current medical problems.  Anticipated discharge in approximately 1-2 day(s).   The patient does have a current PCP (Vivi Barrack, MD) and does not need an [facility] hospital follow-up appointment after discharge.  The patient does not have transportation limitations that hinder transportation to clinic appointments.  .Services Needed at  time of discharge: Y = Yes, Blank = No PT:   OT:   RN:   Equipment:   Other:     LOS: 13 days   Vivi Barrack, MD 12/09/2012, 8:19 AM

## 2012-12-10 MED ORDER — MORPHINE SULFATE (CONCENTRATE) 10 MG /0.5 ML PO SOLN: 5.0000 mg | ORAL | Status: DC | PRN

## 2012-12-10 MED ORDER — BISACODYL 10 MG RE SUPP: 10.0000 mg | Freq: Every day | RECTAL | Status: AC | PRN

## 2012-12-10 MED ORDER — METOPROLOL TARTRATE 25 MG PO TABS: 25.0000 mg | ORAL_TABLET | Freq: Two times a day (BID) | ORAL | Status: DC

## 2012-12-10 MED ORDER — LORAZEPAM 0.5 MG PO TABS: 0.5000 mg | ORAL_TABLET | ORAL | Status: DC | PRN

## 2012-12-10 MED ORDER — METOPROLOL TARTRATE 25 MG PO TABS
25.0000 mg | ORAL_TABLET | Freq: Two times a day (BID) | ORAL | Status: DC
  Filled 2012-12-10 (×2): qty 1

## 2012-12-10 MED ORDER — PROMETHAZINE HCL 6.25 MG/5ML PO SYRP: 12.5000 mg | ORAL_SOLUTION | Freq: Four times a day (QID) | ORAL | Status: DC | PRN

## 2012-12-10 MED ORDER — BIOTENE DRY MOUTH MT LIQD: 15.0000 mL | Freq: Two times a day (BID) | OROMUCOSAL | Status: DC

## 2012-12-10 NOTE — Progress Notes (Signed)
Subjective: Patient seen at the bedside. He is alert and oriented, in good spirits. No acute distress. HR 120s, BP 100s systolic on the monitor. He ate breakfast with no complaints.  Social work met with the family yesterday and sent referrals to residential hospices in [place], [place], [place]. Yesterday we also started back his po metoprolol for symptomatic rate control. His BPs in the evening were soft, 80/40-50s, so his pm dose was held.   Objective: Vital signs in last 24 hours: Filed Vitals:   12/09/12 1851 12/09/12 1945 12/09/12 2000 12/09/12 2215  BP: 125/68 86/50  86/43  Pulse:      Temp:   98.4 F (36.9 C)   TempSrc:   Oral   Resp:  25  19  Height:      Weight:      SpO2:  98%     Weight change:   Intake/Output Summary (Last 24 hours) at 12/10/12 0839 Last data filed at 12/10/12 0330  Gross per 24 hour  Intake      0 ml  Output    125 ml  Net   -125 ml   General: resting in bed, no acute distress Cardiac: tachy, irregular Pulm: moving good air, no rales or ronchi Abd: soft, nontender, mildly distended with poor BS Ext: fingers slightly pale but warm, legs warm Neuro: alert and oriented  Lab Results: Basic Metabolic Panel:  Recent Labs Lab 12/03/12 1546 12/04/12 0610  12/05/12 1731  12/06/12 0405 12/09/12 1130  NA 133* 145  < >  --   < > 147* 143  K 3.5 3.8  < >  --   < > 3.2* 3.2*  CL 97 116*  < >  --   < > 120* 112  CO2 20 17*  < >  --   < > 18* 21  GLUCOSE 160* 129*  < >  --   < > 104* 109*  BUN 45* 60*  < >  --   < > 42* 26*  CREATININE 1.35 1.41*  < >  --   < > 1.07 1.17  CALCIUM 8.0* 6.9*  < >  --   < > 7.2* 7.5*  MG  --  1.8  --  1.9  --   --   --   PHOS  --  2.0*  --   --   --   --   --   < > = values in this interval not displayed. CBC:  Recent Labs Lab 12/06/12 0405 12/09/12 1130  WBC 13.5* 13.0*  HGB 6.1* 6.2*  HCT 17.1* 18.7*  MCV 90.5 98.4  PLT 196 208    Medications: I have reviewed the patient's current  medications. Scheduled Meds: . antiseptic oral rinse  15 mL Mouth Rinse BID  . metoprolol  50 mg Oral BID   Continuous Infusions:  PRN Meds:.bisacodyl, LORazepam, morphine CONCENTRATE, promethazine Assessment/Plan:  Hemorrhagic shock - S/P several units of PRBC and currently is comfort care only. Active bleeding appears to have stopped as his Hgb is stable at 6.2, pt no longer putting out melenotic or BR stool. Palliative care recommends residential hospice due to life expectancy <4-6 weeks. - Appreciate palliative recs - Morphine po prn pain - Ativan prn anxiety or agitation - Promethazine syrup prn nausea - Regular diet for comfort feeding - Will follow up with social work re: hospice bed offers, patient is stable for discharge when available - Transfer order place for palliative bed (  5W) in the meantime  Atrial fibrillation with RVR - Per patient and family request have discontinued amiodarone and diltiazem drips. His HR is currently in the 120s, but he had some hypotension last night. Likely stressor for RVR is loss of blood.  - Will half metoprolol to 25mg  po BID for comfort  CKD (chronic kidney disease), stage III - Stable at Cr 1.17.   Hearing impairment - Doing fairly well and does better with family members at understanding but able to communicate.   Cholecystitis, acute - S/P cholecystectomy. Was on ABX, most recently invanz. He still has a mild leukocytosis at 13.0.  Dyspnea - Managing this symptom with ativan and morphine for comfort.   Dispo: Disposition is deferred at this time, awaiting improvement of current medical problems.  Anticipated discharge in approximately 1-2 day(s).   The patient does have a current PCP (Vivi Barrack, MD) and does not need an [facility] hospital follow-up appointment after discharge.  The patient does not have transportation limitations that hinder transportation to clinic appointments.  .Services Needed at time of discharge: Y = Yes, Blank =  No PT:   OT:   RN:   Equipment:   Other:     LOS: 14 days   Vivi Barrack, MD 12/10/2012, 8:39 AM

## 2012-12-10 NOTE — Progress Notes (Signed)
Pt reported called to [facility]. Wife aware of move. Forms, Scripts, and belongs sent with [facility] to transfer with.

## 2012-12-10 NOTE — Care Management Note (Signed)
    Page 1 of 2   12/10/2012     3:41:44 PM   CARE MANAGEMENT NOTE 12/10/2012  Patient:  [name] Page, [name] Page   Account Number:  [id number]  Date Initiated:  11/27/2012  Documentation initiated by:  Donn Pierini  Subjective/Objective Assessment:   Pt admitted with acute Cholecystitis     Action/Plan:   PTA pt lived at home with spouse   Anticipated DC Date:  12/10/2012   Anticipated DC Plan:  SKILLED NURSING FACILITY  In-house referral  Clinical Social Worker      DC Planning Services  CM consult      Choice offered to / List presented to:             Status of service:  Completed, signed off Medicare Important Message given?   (If response is "NO", the following Medicare IM given date fields will be blank) Date Medicare IM given:   Date Additional Medicare IM given:    Discharge Disposition:  SKILLED NURSING FACILITY  Per UR Regulation:  Reviewed for med. necessity/level of care/duration of stay  If discussed at Long Length of Stay Meetings, dates discussed:   12/03/2012  12/05/2012  12/10/2012    Comments:  12/10/12- 1000- Donn Pierini RN, BSN [phone] Received call from pt's wife regarding d/c plans- per conversation pt's wife still interested in Hospice Home here in [place] but does not desire to travel outside of town to another Hospice home- instead would prefer to look at [facility] on [address] willing to pay out of pocket cost for SNF if needed. Informed CSW- Verlon Au of wife's preference-she is to f/u with pt's wife regarding Hospice Home bed availability and SNf option. 1300- per CSW wife [place]- and prefers to d/c to SNF- to arrange out of pocket cost and paperwork per CSW.   12/09/12- 1400- Donn Pierini RN, BSN [phone] Pt comfort care- all drips have been d/c'd - plan per notes in chart SNF with PC vs hospice home- CSW to f/u with family- pt ready for d/c whenever bed available-  12/06/12 - 1200-  Donn Pierini RN, BSN [phone] Pt has been made comfort care only, drips are to be weaned off- will follow over weekend and if needed transition to either SNF with PC or Residential Hospice first of next week.  12/05/12- 1215- Donn Pierini RN, BSN [phone] Pt had active bleed and now S/p fluid and blood resuscitation and now on amiodarone and diltiazem gtt- NGT d/c'd today and pt started on clears- CSW following for SNF placement   12/02/12- 1115- Donn Pierini RN, BSN [phone] Pt now s/p lap chole- NGT placed today- per PT notes recommendations for SNF when medically stable- will consult CSW for potential placement (wife has taken pt home from SNF in recent past) No family present currently to speak with regarding plans.   11/27/12- 1430 - Donn Pierini RN, BSN [phone] Referral received for hospital bed- currently wife not at bedside- pt resting- NCM to follow up when wife present- per chart wife has made arrangements with an agency ([facility]) for an aide at home- and would like a hospital bed for home- pt would need an order  in order for insurance to pay and would need to qualify for bed- otherwise they would have to pay out of pocket for bed- will explain this to wife -when she is available to speak with her- otherwise will contact her by phone 11/28/12.

## 2012-12-10 NOTE — Progress Notes (Signed)
  Date: 12/10/2012  Patient name: [name] Page  Medical record number: [id number]  Date of birth: 08/31/1924   This patient has been seen and the plan of care was discussed with the house staff, including Dr. Claudell Kyle please see her note for complete details. I concur with their findings with the following additions/corrections:  Discharge is pending locating residential hospice bed for patient.    Inez Catalina, MD 12/10/2012, 1:28 PM

## 2012-12-10 NOTE — Discharge Summary (Signed)
Name: [name] Page MRN: [id number] DOB: [birth year] 79 y.o. PCP: [name] Barrack, MD  Date of Admission: 11/26/2012  9:19 AM Date of Discharge: 12/10/2012 Attending Physician: [name] Catalina, MD  Discharge Diagnosis:  1. Acute GI bleed with hemorrhagic shock 2. Acute cholecystitis s/p laparoscopic cholecystectomy 3. Atrial fibrillation with RVR 4. CKD (chronic kidney disease), stage III  Discharge Medications:   Medication List         antiseptic oral rinse Liqd  15 mLs by Mouth Rinse route 2 (two) times daily.     bisacodyl 10 MG suppository  Commonly known as:  DULCOLAX  Place 1 suppository (10 mg total) rectally daily as needed.     LORazepam 0.5 MG tablet  Commonly known as:  ATIVAN  Take 1 tablet (0.5 mg total) by mouth every 4 (four) hours as needed for anxiety.     metoprolol tartrate 25 MG tablet  Commonly known as:  LOPRESSOR  Take 1 tablet (25 mg total) by mouth 2 (two) times daily.     morphine CONCENTRATE 10 mg / 0.5 ml concentrated solution  Take 0.25 mLs (5 mg total) by mouth every 2 (two) hours as needed.     omeprazole 40 MG capsule  Commonly known as:  PRILOSEC  Take 40 mg by mouth daily.     promethazine 6.25 MG/5ML syrup  Commonly known as:  PHENERGAN  Take 10 mLs (12.5 mg total) by mouth every 6 (six) hours as needed.     traMADol 50 MG tablet  Commonly known as:  ULTRAM  Take 50 mg by mouth every 8 (eight) hours as needed for pain.        Disposition and follow-up:   Mr.[name] Page was discharged from [facility] in Fair condition.  At the hospital follow up visit please address:  1.  Patient is comfort care only. He would like NO aggressive measures. If he develops a recurrent GI bleed, he would not like any interventions, and this includes blood products.  2.  Labs / imaging needed at time of follow-up: None  3.  Pending labs/ test needing follow-up: None  Follow-up Appointments: Follow-up Information   Call  [name] Barrack, MD. (As needed)    Specialty:  Internal Medicine   Contact information:   [address] [place] [address] [phone]       Discharge Instructions: Discharge Orders   Future Orders Complete By Expires   Diet - low sodium heart healthy  As directed    Increase activity slowly  As directed       Consultations: Treatment Team:  Palliative [facility] GI Cardiology Surgery   Procedures Performed:  Ct Abdomen Pelvis Wo Contrast  11/26/2012   *RADIOLOGY REPORT*  Clinical Data: Lower abdominal pain.  CT ABDOMEN AND PELVIS WITHOUT CONTRAST  Technique:  Multidetector CT imaging of the abdomen and pelvis was performed following the standard protocol without intravenous contrast.  Comparison: None.  Findings: Lung Bases:  Dependent atelectasis.  Liver:  Unenhanced CT was performed per clinician order.  Lack of IV contrast limits sensitivity and specificity, especially for evaluation of abdominal/pelvic solid viscera.  Grossly normal.  Spleen:  Spleen appears within normal limits.  Gallbladder:  Markedly distended with innumerable tiny dependently layering gallstones.  There is mural thickening of the gallbladder wall and mild pericholecystic inflammatory changes in the fat adjacent to the hepatic flexure of the colon.  These inflammatory changes appear new when compared to the prior  CT of 08/25/2012.  Common bile duct:  No common duct stone identified on today's exam.  Pancreas:  Pancreatic atrophy.  Adrenal glands:  Mild thickening of the left adrenal gland without focal mass lesion.  Kidneys:  Atrophy.  No stones.  Both ureters appear within normal limits.  Stomach:  Grossly normal.  Decompressed.  Small bowel:  Normal.  Colon:   Colonic diverticulosis is present.  Inflammatory changes are present along the ascending colon and hepatic flexure however these are favored be due to cholecystitis rather than diverticulitis or colitis.  The distal colon appears within normal  limits.  Pelvic Genitourinary:  Prostate calcifications are present. Prostatomegaly.  Bones:  Ankylosis of the right SI joint. Thoracic spine DISH.  Vasculature: Atherosclerosis.  Body Wall: Fat containing periumbilical hernia.  IMPRESSION: 1.  Innumerable gallstones with mild pericholecystic inflammatory changes consistent with cholecystitis. Although the stranding extends up to the ascending colon and hepatic flexure of the gallbladder, colitis is considered unlikely based on the gallbladder disease.  The gallbladder is hydropic and there is chronic mural thickening.  The inflammatory changes around the fundus of the gallbladder are new compared to the prior examination. 2.  Retrieval of previously seen common duct stone.  No recurrent common duct stone is identified. 3.  Other chronic findings appear similar to recent prior exam.   Original Report Authenticated By: Andreas Newport, M.D.   US Abdomen Complete  11/26/2012   *RADIOLOGY REPORT*  Clinical Data:  History of abdominal pain.  History of recent choledocholithiasis.  History of cholelithiasis.  ABDOMINAL ULTRASOUND COMPLETE  Comparison:  CT 08/25/2012.  Large 08/27/2012.  ERCP 08/29/2012.  Findings:  Gallbladder: There is cholelithiasis. Numerous small calculi are seen within the gallbladder.  There are mixed with echogenic sludge. No gallbladder wall thickening or pericholecystic fluid. The gallbladder wall thickness measured 2.7 mm. No sonographic Murphy's sign according to the ultrasound technologist.  CBD: Normal in caliber measuring 5.1 mm. No choledocholithiasis is evident.  Liver:  Normal size and echotexture without focal parenchymal abnormality.  IVC:  Patent throughout its visualized course in the abdomen.  Pancreas:  Although the pancreas is difficult to visualize in its entirety, no focal pancreatic abnormality is identified.  Spleen:  Normal size and echotexture without focal abnormality. Length is 7.2 cm.  Right kidney:  No hydronephrosis.   Well-preserved cortex.  Normal parenchymal echotexture without focal abnormalities.  Right renal length is 9.1 cm.  Left kidney:  No hydronephrosis.  Well-preserved cortex.  Normal parenchymal echotexture. There is a tiny hypoechoic area in the lower pole left kidney consistent with a tiny cyst. The area measures 9 x 9 x 5 mm.  Left renal length is 9.1 cm.  Aorta:  Maximum diameter is 2.6 cm.  No aneurysm is evident.  Ascites:  None.  IMPRESSION:  There is cholelithiasis.  Numerous small calculi are seen within the gallbladder mixed with echogenic sludge.  No ultrasonic evidence of acute cholecystitis is demonstrated.  No choledocholithiasis  could be identified.There appears to be a tiny cyst involving the left kidney.   Original Report Authenticated By: Onalee Hua Call   Dg Abd Portable 2v  12/04/2012   CLINICAL DATA:  GI bleed  EXAM: PORTABLE ABDOMEN - 2 VIEW  COMPARISON:  Abdomen films of 12/02/2012  FINDINGS: There has been improvement with less gaseous distention of the stomach after insertion of NG tube. The tip of the NG tube is in the region of the descending duodenum. No present bowel obstruction is noted  and no free air is seen on the left lateral decubitus abdomen image.  IMPRESSION: Resolution of gastric distention of the stomach after placement of NG tube. No bowel obstruction. No free air.   Electronically Signed   By: Dwyane Dee M.D.   On: 12/04/2012 09:24   Dg Abd Portable 2v  12/02/2012   CLINICAL DATA:  Post lap cholecystectomy, nausea and vomiting, some abdominal distension  EXAM: PORTABLE ABDOMEN - 2 VIEW  COMPARISON:  CT abdomen pelvis of 11/26/2012  FINDINGS: There is gaseous distention of the stomach. However the remainder of the bowel is not distended and there is air seen to the rectum. Surgical clips are noted in the right upper quadrant. A left lateral decubitus of the abdomen shows no evidence of free intraperitoneal air.  IMPRESSION: 1. Gaseous distention of the stomach. 2. No other  evidence of bowel distention is seen   Electronically Signed   By: Dwyane Dee M.D.   On: 12/02/2012 08:51    Admission HPI:  [name] Page is a 79 y.o. male PMH atrial fibrillation not on coumadin 2/2 PMH UGIB and SDH, HTN, CKD, recent hospital admission for AF with RVR and retained common bile duct stone with elevated LFTs requiring ERCP on 08/19/12, who presents as a transfer from [facility] with a chief complaint of abdominal pain. His wife of 66 years is at the bedside to assist in the history.   Mr. Seufert has had abdominal pain for about 2 days. It is mostly in the RUQ and epigastric region. He denies fevers, nausea, vomiting, diarrhea, bloody stools, chest pain, cough. Symptoms are persistent and nothing seems to make it better or worse. He is not able to describe the pain for [place]. He has had decreased po intake over the past 2 days, eating only Cheerios and half a sandwich yesterday and nothing today. He normally has a very good appetite, per his wife. He did not take his medications his morning for blood pressure and AFib. He is wheelchair bound. PT/OT recommended SNF during his last hospitalization, and he was discharged to [facility]. However, his wife eventually took him out as she felt she could provide the same care at home.   In the ED an abdominal ultrasound was performed which showed numerous gallstones mixed with sludge. Follow up CT of the abdomen showed pericholecystic inflammatory changes consistent with cholecystitis, new since prior exams. The radiologist also noted the gallbladder was hydropic with chronic mural thickening. Patient was afebrile with no leukocytosis. He was made NPO, IV fluids and Cipro were started. Upon transfer he was found to be in AF with RVR, HR 120-140. He received IV metoprolol 5mg  x2. He also received 1mg  Ativan 2/2 agitation during transfer.   Hospital Course by problem list:  1. Acute GI bleed with hemorrhagic shock - Pt decompensated acutely on  post-op day 5 when he had a large, bloody BM. Etiology was thought to be upper GI, probably a bleeding ulcer. He became hypotensive to as low as 45/20, tachycardic to the 180s, cool and clammy, unresponsive, there was also bloody NG tube output (he had an NGT in place for post-op ileus). Patient was and is DNR/DNI. He was given aggressive fluid resuscitation through peripheral IVs (family did not want central line) with good response; patient became arousable, BP improved. His Hgb was stable the day after the event at 7.7, s/p multiple units of blood, fluids, and FFP. This was an overall decrease from Hgb 14.1 prior  to the bleed. Mr. Krummel was placed on a protonix gtt and stress dose steroids. Amiodarone gtt was also started in the setting of persistent tachycardia after volume resuscitation, as there was concern for his AFib with RVR adding a cardiogenic component to his shock (see problem below).   When the patient stabilized and returned his to baseline mental status, he expressed wishes to be comfort care only with no EGD, drips, or blood products. Palliative care was consulted and met with the family and the patient. Together they agreed to make Mr. Ellenwood completely comfort care. Last labs drawn prior to this decision showed a downtrending Hg of 6.1 and signs of active bleeding including melena in his rectal Foley. We planned for a hospital death. However, in the ensuing days his GI bleeding stopped on its own. Vital signs stabilized. We did check labs on 9/28 in order to triage him for residential hospice vs. SNF; they showed a relatively stable Hg of 6.2. Patient will therefore be discharged to SNF with plans for home hospice care, as he is at high risk of a recurrent GI bleed that should not be treated with aggressive measures per his wishes.  2. Acute cholecystitis s/p laparoscopic cholecystectomy - Pt was originally admitted for acute cholecystitis. The family was counseled extensively on the  risks and benefits of surgery given his other medical problems, and ultimately opted to proceed. He went to the OR for a lap chole by Dr. Dwain Sarna on 11/29/12. It was a difficult surgery with lots of inflammation and adhesion of the gallbladder to the omentum, liver, colon, as well as some spillage of pus. Post-op he developed an ileus with nausea, vomiting, abdominal distention, and decreased bowel sounds. AXR showed distention of the stomach. An NG tube was placed with some relief. The patient did have an elevated WBC to 16.3 on POD#3 and was transitioned to broader spectrum antibiotic coverage with Invanz. This was discontinued when the family opted for comfort care a few days later. His white count has since down-trended to 13.0 on 9/28. He is currently having normal bowel movements after recovering from his acute GIB (see problem above).  3. Atrial fibrillation with RVR - Patient has a history of permanent AFib, rate controlled at home on metoprolol 50mg  BID po. He does not take coumadin due to a PMH of GIB and subdural hematoma. We struggled to control his rate throughout his hospitalization. He was in RVR to PR 150s  when he [facility] from [facility] ED, so we placed him on a diltiazem gtt. Cardiac enzymes were trended and negative x3. During his acute GIB causing hemorrhagic shock, his PR was as high as 190. We began an amiodarone gtt as recommended by cardiology in the setting of hypovolemia and hypotension. He did well on this and after his BPs stabilized, we added back the diltiazem gtt. Once the decision for comfort care was made, all drips were stopped. We did place him back on his home metoprolol po for comfort and to aid in placement, but he developed some recurrent hypotension with this on 9/29. Thus the dose was halved to 25mg  BID at discharge.  4. CKD (chronic kidney disease), stage III - His Cr was at baseline at discharge (1.1), remarkably even after his acute blood loss and  discontinuation of all fluids and blood products per patient's wishes.   Discharge Vitals:   BP 86/43  Pulse 158  Temp(Src) 98.4 F (36.9 C) (Oral)  Resp 19  Ht  6\' 1"  (1.854 m)  Wt 173 lb 8 oz (78.7 kg)  BMI 22.9 kg/m2  SpO2 98%  Discharge Labs:  BMET    Component Value Date/Time   NA 143 12/09/2012 1130   K 3.2* 12/09/2012 1130   CL 112 12/09/2012 1130   CO2 21 12/09/2012 1130   GLUCOSE 109* 12/09/2012 1130   BUN 26* 12/09/2012 1130   CREATININE 1.17 12/09/2012 1130   CREATININE 1.80* 10/01/2012 0949   CALCIUM 7.5* 12/09/2012 1130   GFRNONAA 55* 12/09/2012 1130   GFRAA 63* 12/09/2012 1130    CBC    Component Value Date/Time   WBC 13.0* 12/09/2012 1130   RBC 1.90* 12/09/2012 1130   HGB 6.2* 12/09/2012 1130   HCT 18.7* 12/09/2012 1130   PLT 208 12/09/2012 1130   MCV 98.4 12/09/2012 1130   MCH 32.6 12/09/2012 1130   MCHC 33.2 12/09/2012 1130   RDW 20.5* 12/09/2012 1130   LYMPHSABS 1.2 12/03/2012 0410   MONOABS 1.2* 12/03/2012 0410   EOSABS 0.0 12/03/2012 0410   BASOSABS 0.0 12/03/2012 0410      Signed: Vivi Barrack, MD 12/10/2012, 2:20 PM   Time Spent on Discharge: 40 minutes Services Ordered on Discharge: Hospice Equipment Ordered on Discharge: None

## 2012-12-10 NOTE — Clinical Social Work Note (Signed)
Clinical Social Worker was informed that wife did not want to consider anything outside [facility].CSW met with wife and informed her that [facility] currently had no availability and if she wanted to proceed with [facility], there would be a $15,000 upfront payment. Wife reported that she was agreeable to this price and wanted to proceed with [facility]. CSW notified MD. CSW will complete discharge packet with DNR form inside. Wife reported that she will wait for patient at facility. CSW will sign off, as social work intervention is no longer needed.   Rozetta Nunnery MSW, [employer] [phone]

## 2012-12-12 NOTE — Discharge Summary (Signed)
I saw [name] Page on day of discharge and agree with discharge plan.

## 2012-12-24 NOTE — Consult Note (Signed)
I have reviewed and discussed the care of this patient in detail with the nurse practitioner including pertinent patient records, physical exam findings and data. I agree with details of this encounter.  

## 2014-03-30 DIAGNOSIS — H109 Unspecified conjunctivitis: Secondary | ICD-10-CM | POA: Diagnosis not present

## 2014-04-18 DIAGNOSIS — N184 Chronic kidney disease, stage 4 (severe): Secondary | ICD-10-CM | POA: Diagnosis not present

## 2014-04-18 DIAGNOSIS — I4891 Unspecified atrial fibrillation: Secondary | ICD-10-CM | POA: Diagnosis not present

## 2014-04-18 DIAGNOSIS — I6789 Other cerebrovascular disease: Secondary | ICD-10-CM | POA: Diagnosis not present

## 2014-04-18 DIAGNOSIS — I6200 Nontraumatic subdural hemorrhage, unspecified: Secondary | ICD-10-CM | POA: Diagnosis not present

## 2014-04-18 DIAGNOSIS — F1010 Alcohol abuse, uncomplicated: Secondary | ICD-10-CM | POA: Diagnosis not present

## 2014-08-24 DIAGNOSIS — N390 Urinary tract infection, site not specified: Secondary | ICD-10-CM | POA: Diagnosis not present

## 2014-08-26 DIAGNOSIS — I4891 Unspecified atrial fibrillation: Secondary | ICD-10-CM | POA: Diagnosis not present

## 2014-08-26 DIAGNOSIS — R109 Unspecified abdominal pain: Secondary | ICD-10-CM | POA: Diagnosis not present

## 2014-08-26 DIAGNOSIS — N184 Chronic kidney disease, stage 4 (severe): Secondary | ICD-10-CM | POA: Diagnosis not present

## 2014-08-26 DIAGNOSIS — I639 Cerebral infarction, unspecified: Secondary | ICD-10-CM | POA: Diagnosis not present

## 2014-08-26 DIAGNOSIS — N390 Urinary tract infection, site not specified: Secondary | ICD-10-CM | POA: Diagnosis not present

## 2014-10-01 DIAGNOSIS — I4891 Unspecified atrial fibrillation: Secondary | ICD-10-CM | POA: Diagnosis not present

## 2014-10-01 DIAGNOSIS — I6200 Nontraumatic subdural hemorrhage, unspecified: Secondary | ICD-10-CM | POA: Diagnosis not present

## 2014-10-01 DIAGNOSIS — N184 Chronic kidney disease, stage 4 (severe): Secondary | ICD-10-CM | POA: Diagnosis not present

## 2014-10-01 DIAGNOSIS — I639 Cerebral infarction, unspecified: Secondary | ICD-10-CM | POA: Diagnosis not present

## 2014-10-01 DIAGNOSIS — N390 Urinary tract infection, site not specified: Secondary | ICD-10-CM | POA: Diagnosis not present

## 2014-10-23 ENCOUNTER — Encounter (HOSPITAL_COMMUNITY): Payer: Self-pay | Admitting: Emergency Medicine

## 2014-10-23 ENCOUNTER — Emergency Department (HOSPITAL_COMMUNITY): Payer: Medicare Other

## 2014-10-23 ENCOUNTER — Inpatient Hospital Stay (HOSPITAL_COMMUNITY)
Admission: EM | Admit: 2014-10-23 | Discharge: 2014-10-27 | DRG: 872 | Disposition: A | Discharge status: Skilled Nursing Facility | Payer: Medicare Other | Attending: Internal Medicine | Admitting: Internal Medicine

## 2014-10-23 DIAGNOSIS — Z8673 Personal history of transient ischemic attack (TIA), and cerebral infarction without residual deficits: Secondary | ICD-10-CM | POA: Diagnosis not present

## 2014-10-23 DIAGNOSIS — Z880 Allergy status to penicillin: Secondary | ICD-10-CM | POA: Diagnosis not present

## 2014-10-23 DIAGNOSIS — A419 Sepsis, unspecified organism: Secondary | ICD-10-CM

## 2014-10-23 DIAGNOSIS — F1010 Alcohol abuse, uncomplicated: Secondary | ICD-10-CM | POA: Diagnosis present

## 2014-10-23 DIAGNOSIS — Z79899 Other long term (current) drug therapy: Secondary | ICD-10-CM

## 2014-10-23 DIAGNOSIS — I499 Cardiac arrhythmia, unspecified: Secondary | ICD-10-CM | POA: Diagnosis not present

## 2014-10-23 DIAGNOSIS — Z9049 Acquired absence of other specified parts of digestive tract: Secondary | ICD-10-CM | POA: Diagnosis present

## 2014-10-23 DIAGNOSIS — Z87891 Personal history of nicotine dependence: Secondary | ICD-10-CM | POA: Diagnosis not present

## 2014-10-23 DIAGNOSIS — R509 Fever, unspecified: Secondary | ICD-10-CM | POA: Diagnosis not present

## 2014-10-23 DIAGNOSIS — N390 Urinary tract infection, site not specified: Secondary | ICD-10-CM | POA: Diagnosis present

## 2014-10-23 DIAGNOSIS — F99 Mental disorder, not otherwise specified: Secondary | ICD-10-CM | POA: Diagnosis not present

## 2014-10-23 DIAGNOSIS — I4891 Unspecified atrial fibrillation: Secondary | ICD-10-CM | POA: Diagnosis present

## 2014-10-23 DIAGNOSIS — I129 Hypertensive chronic kidney disease with stage 1 through stage 4 chronic kidney disease, or unspecified chronic kidney disease: Secondary | ICD-10-CM | POA: Diagnosis present

## 2014-10-23 DIAGNOSIS — Z66 Do not resuscitate: Secondary | ICD-10-CM | POA: Diagnosis present

## 2014-10-23 DIAGNOSIS — R7881 Bacteremia: Secondary | ICD-10-CM | POA: Diagnosis not present

## 2014-10-23 DIAGNOSIS — Z885 Allergy status to narcotic agent status: Secondary | ICD-10-CM | POA: Diagnosis not present

## 2014-10-23 DIAGNOSIS — N183 Chronic kidney disease, stage 3 unspecified: Secondary | ICD-10-CM | POA: Diagnosis present

## 2014-10-23 DIAGNOSIS — A4151 Sepsis due to Escherichia coli [E. coli]: Principal | ICD-10-CM | POA: Diagnosis present

## 2014-10-23 DIAGNOSIS — R1310 Dysphagia, unspecified: Secondary | ICD-10-CM | POA: Diagnosis not present

## 2014-10-23 DIAGNOSIS — I10 Essential (primary) hypertension: Secondary | ICD-10-CM | POA: Diagnosis present

## 2014-10-23 DIAGNOSIS — H9190 Unspecified hearing loss, unspecified ear: Secondary | ICD-10-CM | POA: Diagnosis not present

## 2014-10-23 LAB — CBC WITH DIFFERENTIAL/PLATELET
Basophils Absolute: 0 10*3/uL (ref 0.0–0.1)
Basophils Relative: 0 % (ref 0–1)
EOS PCT: 0 % (ref 0–5)
Eosinophils Absolute: 0 10*3/uL (ref 0.0–0.7)
HEMATOCRIT: 39.2 % (ref 39.0–52.0)
Hemoglobin: 13.4 g/dL (ref 13.0–17.0)
LYMPHS ABS: 0.7 10*3/uL (ref 0.7–4.0)
LYMPHS PCT: 6 % — AB (ref 12–46)
MCH: 35.5 pg — ABNORMAL HIGH (ref 26.0–34.0)
MCHC: 34.2 g/dL (ref 30.0–36.0)
MCV: 104 fL — AB (ref 78.0–100.0)
MONO ABS: 1.4 10*3/uL — AB (ref 0.1–1.0)
Monocytes Relative: 13 % — ABNORMAL HIGH (ref 3–12)
NEUTROS ABS: 8.8 10*3/uL — AB (ref 1.7–7.7)
Neutrophils Relative %: 81 % — ABNORMAL HIGH (ref 43–77)
Platelets: 135 10*3/uL — ABNORMAL LOW (ref 150–400)
RBC: 3.77 MIL/uL — AB (ref 4.22–5.81)
RDW: 13.2 % (ref 11.5–15.5)
WBC: 10.9 10*3/uL — AB (ref 4.0–10.5)

## 2014-10-23 LAB — COMPREHENSIVE METABOLIC PANEL
ALK PHOS: 99 U/L (ref 38–126)
ALT: 25 U/L (ref 17–63)
ANION GAP: 8 (ref 5–15)
AST: 36 U/L (ref 15–41)
Albumin: 3.3 g/dL — ABNORMAL LOW (ref 3.5–5.0)
BILIRUBIN TOTAL: 1.9 mg/dL — AB (ref 0.3–1.2)
BUN: 19 mg/dL (ref 6–20)
CO2: 22 mmol/L (ref 22–32)
Calcium: 8.6 mg/dL — ABNORMAL LOW (ref 8.9–10.3)
Chloride: 107 mmol/L (ref 101–111)
Creatinine, Ser: 1.78 mg/dL — ABNORMAL HIGH (ref 0.61–1.24)
GFR calc non Af Amer: 32 mL/min — ABNORMAL LOW (ref >60)
GFR, EST AFRICAN AMERICAN: 38 mL/min — AB (ref >60)
GLUCOSE: 136 mg/dL — AB (ref 65–99)
POTASSIUM: 4.1 mmol/L (ref 3.5–5.1)
Sodium: 137 mmol/L (ref 135–145)
TOTAL PROTEIN: 6.6 g/dL (ref 6.5–8.1)

## 2014-10-23 LAB — URINALYSIS, ROUTINE W REFLEX MICROSCOPIC
BILIRUBIN URINE: NEGATIVE
GLUCOSE, UA: NEGATIVE mg/dL
KETONES UR: NEGATIVE mg/dL
Nitrite: NEGATIVE
PH: 6 (ref 5.0–8.0)
Protein, ur: 100 mg/dL — AB
SPECIFIC GRAVITY, URINE: 1.015 (ref 1.005–1.030)
Urobilinogen, UA: 1 mg/dL (ref 0.0–1.0)

## 2014-10-23 LAB — I-STAT TROPONIN, ED: TROPONIN I, POC: 0.01 ng/mL (ref 0.00–0.08)

## 2014-10-23 LAB — URINE MICROSCOPIC-ADD ON

## 2014-10-23 LAB — I-STAT CG4 LACTIC ACID, ED: LACTIC ACID, VENOUS: 1.25 mmol/L (ref 0.5–2.0)

## 2014-10-23 LAB — PROCALCITONIN: PROCALCITONIN: 0.65 ng/mL

## 2014-10-23 MED ORDER — DEXTROSE 5 % IV SOLN
1.0000 g | Freq: Two times a day (BID) | INTRAVENOUS | Status: DC
  Administered 2014-10-24 – 2014-10-26 (×5): 1 g via INTRAVENOUS
  Filled 2014-10-23 (×7): qty 1

## 2014-10-23 MED ORDER — LEVOFLOXACIN IN D5W 750 MG/150ML IV SOLN: 750.0000 mg | INTRAVENOUS | Status: DC

## 2014-10-23 MED ORDER — AZTREONAM 2 G IJ SOLR
2.0000 g | INTRAMUSCULAR | Status: AC
  Administered 2014-10-23: 2 g via INTRAVENOUS
  Filled 2014-10-23: qty 2

## 2014-10-23 MED ORDER — LEVOFLOXACIN IN D5W 750 MG/150ML IV SOLN
750.0000 mg | Freq: Once | INTRAVENOUS | Status: AC
Start: 2014-10-23 — End: 2014-10-23
  Administered 2014-10-23: 750 mg via INTRAVENOUS
  Filled 2014-10-23: qty 150

## 2014-10-23 NOTE — ED Notes (Signed)
Doctor notified urine test has resulted.

## 2014-10-23 NOTE — ED Notes (Signed)
Called lab able to add Protime INR and aPTT to available blood.

## 2014-10-23 NOTE — ED Notes (Signed)
Doctor at bedside.

## 2014-10-23 NOTE — ED Notes (Signed)
Son and daughter in law arrived and at bedside. States patient currently alert to his normal and very hard of hearing. No history of dementia.
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
## 2014-10-23 NOTE — ED Notes (Signed)
Spoke with Doctor waiting to order antibiotics and IV fluids,

## 2014-10-23 NOTE — ED Notes (Signed)
Doctor stated will add additional orders.
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
## 2014-10-23 NOTE — ED Notes (Signed)
Patient tolerated in and out cath without incident. EMT at bedside urine yellow cloudy with odor.

## 2014-10-23 NOTE — ED Notes (Signed)
Send via EMS by nursing home for evaluation of HR greater than 150, UTI, and fever 102.0.  EMS reported staff reported patient not acting himself however improving with history of dementia.  EMS report Afib with RVR HR 168. Administered Cardizem 10 mg IV at 1942 and 1950. HR decreases to 103 and stayed under 150.
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
## 2014-10-24 LAB — BASIC METABOLIC PANEL
ANION GAP: 10 (ref 5–15)
BUN: 18 mg/dL (ref 6–20)
CALCIUM: 8.4 mg/dL — AB (ref 8.9–10.3)
CHLORIDE: 105 mmol/L (ref 101–111)
CO2: 20 mmol/L — AB (ref 22–32)
CREATININE: 1.65 mg/dL — AB (ref 0.61–1.24)
GFR calc Af Amer: 41 mL/min — ABNORMAL LOW (ref >60)
GFR, EST NON AFRICAN AMERICAN: 35 mL/min — AB (ref >60)
Glucose, Bld: 97 mg/dL (ref 65–99)
Potassium: 4.3 mmol/L (ref 3.5–5.1)
SODIUM: 135 mmol/L (ref 135–145)

## 2014-10-24 LAB — CBC
HCT: 39.8 % (ref 39.0–52.0)
HEMOGLOBIN: 13.7 g/dL (ref 13.0–17.0)
MCH: 35.6 pg — AB (ref 26.0–34.0)
MCHC: 34.4 g/dL (ref 30.0–36.0)
MCV: 103.4 fL — AB (ref 78.0–100.0)
Platelets: 143 10*3/uL — ABNORMAL LOW (ref 150–400)
RBC: 3.85 MIL/uL — AB (ref 4.22–5.81)
RDW: 13.3 % (ref 11.5–15.5)
WBC: 10.2 10*3/uL (ref 4.0–10.5)

## 2014-10-24 LAB — PROTIME-INR
INR: 1.26 (ref 0.00–1.49)
PROTHROMBIN TIME: 15.9 s — AB (ref 11.6–15.2)

## 2014-10-24 LAB — LACTIC ACID, PLASMA: Lactic Acid, Venous: 2.3 mmol/L (ref 0.5–2.0)

## 2014-10-24 LAB — APTT: APTT: 40 s — AB (ref 24–37)

## 2014-10-24 MED ORDER — ONDANSETRON HCL 4 MG PO TABS: 4.0000 mg | ORAL_TABLET | Freq: Four times a day (QID) | ORAL | Status: DC | PRN

## 2014-10-24 MED ORDER — DILTIAZEM HCL 100 MG IV SOLR
5.0000 mg/h | INTRAVENOUS | Status: DC
  Administered 2014-10-24: 5 mg/h via INTRAVENOUS
  Administered 2014-10-24: 10 mg/h via INTRAVENOUS
  Filled 2014-10-24 (×2): qty 100

## 2014-10-24 MED ORDER — DILTIAZEM HCL 30 MG PO TABS
30.0000 mg | ORAL_TABLET | Freq: Four times a day (QID) | ORAL | Status: DC
  Administered 2014-10-24: 30 mg via ORAL
  Filled 2014-10-24 (×4): qty 1

## 2014-10-24 MED ORDER — SODIUM CHLORIDE 0.9 % IV SOLN: INTRAVENOUS | Status: DC

## 2014-10-24 MED ORDER — ONDANSETRON HCL 4 MG/2ML IJ SOLN: 4.0000 mg | Freq: Four times a day (QID) | INTRAMUSCULAR | Status: DC | PRN

## 2014-10-24 MED ORDER — HYDROMORPHONE HCL 1 MG/ML IJ SOLN
0.5000 mg | INTRAMUSCULAR | Status: DC | PRN
  Administered 2014-10-24: 0.5 mg via INTRAVENOUS
  Filled 2014-10-24: qty 1

## 2014-10-24 MED ORDER — ACETAMINOPHEN 325 MG PO TABS: 650.0000 mg | ORAL_TABLET | Freq: Four times a day (QID) | ORAL | Status: DC | PRN

## 2014-10-24 MED ORDER — ALUM & MAG HYDROXIDE-SIMETH 200-200-20 MG/5ML PO SUSP: 30.0000 mL | Freq: Four times a day (QID) | ORAL | Status: DC | PRN

## 2014-10-24 MED ORDER — METOPROLOL TARTRATE 25 MG PO TABS
25.0000 mg | ORAL_TABLET | Freq: Two times a day (BID) | ORAL | Status: DC
  Administered 2014-10-24 (×2): 25 mg via ORAL
  Filled 2014-10-24 (×4): qty 1

## 2014-10-24 MED ORDER — SODIUM CHLORIDE 0.9 % IV SOLN
INTRAVENOUS | Status: DC
  Administered 2014-10-24: 02:00:00 via INTRAVENOUS

## 2014-10-24 MED ORDER — ENOXAPARIN SODIUM 30 MG/0.3ML ~~LOC~~ SOLN
30.0000 mg | SUBCUTANEOUS | Status: DC
  Administered 2014-10-24 – 2014-10-25 (×2): 30 mg via SUBCUTANEOUS
  Filled 2014-10-24 (×3): qty 0.3

## 2014-10-24 MED ORDER — SENNA 8.6 MG PO TABS
1.0000 | ORAL_TABLET | Freq: Two times a day (BID) | ORAL | Status: DC
  Administered 2014-10-24 – 2014-10-27 (×7): 8.6 mg via ORAL
  Filled 2014-10-24 (×9): qty 1

## 2014-10-24 MED ORDER — ACETAMINOPHEN 650 MG RE SUPP: 650.0000 mg | Freq: Four times a day (QID) | RECTAL | Status: DC | PRN

## 2014-10-24 MED ORDER — FAMOTIDINE 20 MG PO TABS
20.0000 mg | ORAL_TABLET | Freq: Every day | ORAL | Status: DC
Start: 2014-10-24 — End: 2014-10-27
  Administered 2014-10-24 – 2014-10-26 (×3): 20 mg via ORAL
  Filled 2014-10-24 (×5): qty 1

## 2014-10-24 MED ORDER — DILTIAZEM HCL 25 MG/5ML IV SOLN
10.0000 mg | Freq: Once | INTRAVENOUS | Status: AC
  Administered 2014-10-24: 10 mg via INTRAVENOUS
  Filled 2014-10-24: qty 5

## 2014-10-24 MED ORDER — DILTIAZEM HCL 30 MG PO TABS
30.0000 mg | ORAL_TABLET | Freq: Three times a day (TID) | ORAL | Status: DC
  Administered 2014-10-25: 30 mg via ORAL
  Filled 2014-10-24 (×4): qty 1

## 2014-10-24 NOTE — Progress Notes (Signed)
CRITICAL VALUE ALERT  Critical value received:  2.3  Date of notification:  10/24/14  Time of notification:  0724  Critical value read back:Yes.    Nurse who received alert:  Isidor Holts  MD notified (1st page):  Dr Jerral Ralph  Time of first page:  0725  MD notified (2nd page):  Time of second page:  Responding MD:  Dr. Jerral Ralph  Time MD responded:  [phone]
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
## 2014-10-24 NOTE — Progress Notes (Signed)
MD notified of critical Lactic Acid, no orders received will continue to monitor patient closely.

## 2014-10-24 NOTE — Progress Notes (Signed)
PATIENT DETAILS Name: [name] Page Age: 81 y.o. Sex: male Date of Birth: 1924-07-30 Admit Date: 10/23/2014 Admitting Physician Ron Parker, MD PCP:No primary care provider on file.  Subjective: Very hard of hearing-but denies chest pain, SOB, Nausea/vomting  Assessment/Plan: Principal Problem: Sepsis:secondary to UTI. Afebrile, does not look toxic. Continue IV Aztreonam, will discontinue IV Levaquin and await cultures.  Active Problems: UTI: as above  Afib RVR: rate improving on cardizem gtt-start Lopressor and attempt to taper Cardizem gtt.  Not a candidate for anticoagulation-given prior hx of GI bleed and SDH (reviewed prior d/c summary 2014).   Acute on CKD stage 3:ARF secondary to pre-renal azotemia in a setting of sepsis. Treat with IVF-and follow lytes, if no improvement, then further work u p  Essential ZOX:[id number] cardizem, metoprolol.  Disposition: Remain inpatient-in SDU till of cardizem gtt  Antimicrobial agents  See below  Anti-infectives    Start     Dose/Rate Route Frequency Ordered Stop   10/25/14 2000  levofloxacin (LEVAQUIN) IVPB 750 mg     750 mg 100 mL/hr over 90 Minutes Intravenous Every 48 hours 10/23/14 2248     10/24/14 1000  aztreonam (AZACTAM) 1 g in dextrose 5 % 50 mL IVPB     1 g 100 mL/hr over 30 Minutes Intravenous Every 12 hours 10/23/14 2248     10/23/14 2230  levofloxacin (LEVAQUIN) IVPB 750 mg     750 mg 100 mL/hr over 90 Minutes Intravenous  Once 10/23/14 2224 10/23/14 2335   10/23/14 2230  aztreonam (AZACTAM) 2 g in dextrose 5 % 50 mL IVPB     2 g 100 mL/hr over 30 Minutes Intravenous STAT 10/23/14 2224 10/23/14 2357      DVT Prophylaxis: Prophylactic Lovenox   Code Status: DNR  Family Communication None at bedside  Procedures: None  CONSULTS:  None  Time spent 30 minutes-Greater than 50% of this time was spent in counseling, explanation of diagnosis, planning of further  management, and coordination of care.  MEDICATIONS: Scheduled Meds: . aztreonam  1 g Intravenous Q12H  . diltiazem  30 mg Oral QID  . enoxaparin (LOVENOX) injection  30 mg Subcutaneous Q24H  . famotidine  20 mg Oral QHS  . [START ON 10/25/2014] levofloxacin (LEVAQUIN) IV  750 mg Intravenous Q48H  . senna  1 tablet Oral BID   Continuous Infusions: . diltiazem (CARDIZEM) infusion 10 mg/hr (10/24/14 1300)   PRN Meds:.acetaminophen **OR** acetaminophen, alum & mag hydroxide-simeth, HYDROmorphone (DILAUDID) injection, ondansetron **OR** ondansetron (ZOFRAN) IV    PHYSICAL EXAM: Vital signs in last 24 hours: Filed Vitals:   10/24/14 0335 10/24/14 0700 10/24/14 0830 10/24/14 1200  BP: 108/78  122/82 108/85  Pulse: 94  119 104  Temp:  98.6 F (37 C) 98.6 F (37 C) 98.2 F (36.8 C)  TempSrc:  Oral Oral Oral  Resp: 11  18 21   Height:      Weight:      SpO2: 94%  96% 97%    Weight change:  Filed Weights   10/23/14 2019 10/24/14 0020  Weight: 79.379 kg (175 lb) 78.4 kg (172 lb 13.5 oz)   Body mass index is 24.12 kg/(m^2).   Gen Exam: Awake and alert with clear speech-very hard of hearing.  Neck: Supple, No JVD.   Chest: B/L Clear.   CVS: S1 S2 Regular, no murmurs.  Abdomen: soft, BS +, non tender, non distended.  Extremities: no edema, lower extremities warm to touch. Neurologic: Non Focal.   Skin: No Rash.  Wounds: N/A.    Intake/Output from previous day:  Intake/Output Summary (Last 24 hours) at 10/24/14 1355 Last data filed at 10/24/14 1212  Gross per 24 hour  Intake    820 ml  Output    250 ml  Net    570 ml     LAB RESULTS: CBC  Recent Labs Lab 10/23/14 2040 10/24/14 0258  WBC 10.9* 10.2  HGB 13.4 13.7  HCT 39.2 39.8  PLT 135* 143*  MCV 104.0* 103.4*  MCH 35.5* 35.6*  MCHC 34.2 34.4  RDW 13.2 13.3  LYMPHSABS 0.7  --   MONOABS 1.4*  --   EOSABS 0.0  --   BASOSABS 0.0  --     Chemistries   Recent Labs Lab 10/23/14 2040 10/24/14 0258  NA  137 135  K 4.1 4.3  CL 107 105  CO2 22 20*  GLUCOSE 136* 97  BUN 19 18  CREATININE 1.78* 1.65*  CALCIUM 8.6* 8.4*    CBG: No results for input(s): GLUCAP in the last 168 hours.  GFR Estimated Creatinine Clearance: 33 mL/min (by C-G formula based on Cr of 1.65).  Coagulation profile  Recent Labs Lab 10/23/14 2355  INR 1.26    Cardiac Enzymes No results for input(s): CKMB, TROPONINI, MYOGLOBIN in the last 168 hours.  Invalid input(s): CK  Invalid input(s): POCBNP No results for input(s): DDIMER in the last 72 hours. No results for input(s): HGBA1C in the last 72 hours. No results for input(s): CHOL, HDL, LDLCALC, TRIG, CHOLHDL, LDLDIRECT in the last 72 hours. No results for input(s): TSH, T4TOTAL, T3FREE, THYROIDAB in the last 72 hours.  Invalid input(s): FREET3 No results for input(s): VITAMINB12, FOLATE, FERRITIN, TIBC, IRON, RETICCTPCT in the last 72 hours. No results for input(s): LIPASE, AMYLASE in the last 72 hours.  Urine Studies No results for input(s): UHGB, CRYS in the last 72 hours.  Invalid input(s): UACOL, UAPR, USPG, UPH, UTP, UGL, UKET, UBIL, UNIT, UROB, ULEU, UEPI, UWBC, URBC, UBAC, CAST, UCOM, BILUA  MICROBIOLOGY: No results found for this or any previous visit (from the past 240 hour(s)).  RADIOLOGY STUDIES/RESULTS: Dg Chest Port 1 View  10/23/2014   CLINICAL DATA:  Fevers  EXAM: PORTABLE CHEST - 1 VIEW  COMPARISON:  08/25/2012  FINDINGS: Cardiac shadow is stable. The lungs are well aerated without focal infiltrate. Very minimal interstitial changes are noted which may be related to vascular congestion. No acute bony abnormality is seen.  IMPRESSION: Mild interstitial changes which may be related to CHF.   Electronically Signed   By: Alcide Clever M.D.   On: 10/23/2014 21:02    Jeoffrey Massed, MD  [facility] Pager:[phone]  If 7PM-7AM, please contact night-coverage [email or web address] Password [email or web address] 10/24/2014, 1:55 PM   LOS: 1 day

## 2014-10-25 LAB — BASIC METABOLIC PANEL
Anion gap: 10 (ref 5–15)
BUN: 21 mg/dL — ABNORMAL HIGH (ref 6–20)
CALCIUM: 8.6 mg/dL — AB (ref 8.9–10.3)
CO2: 21 mmol/L — AB (ref 22–32)
Chloride: 106 mmol/L (ref 101–111)
Creatinine, Ser: 1.57 mg/dL — ABNORMAL HIGH (ref 0.61–1.24)
GFR calc Af Amer: 44 mL/min — ABNORMAL LOW (ref >60)
GFR, EST NON AFRICAN AMERICAN: 38 mL/min — AB (ref >60)
Glucose, Bld: 95 mg/dL (ref 65–99)
POTASSIUM: 3.8 mmol/L (ref 3.5–5.1)
Sodium: 137 mmol/L (ref 135–145)

## 2014-10-25 LAB — URINE CULTURE: Culture: >=100000

## 2014-10-25 LAB — MRSA PCR SCREENING: MRSA by PCR: NEGATIVE

## 2014-10-25 LAB — CBC
HEMATOCRIT: 37.6 % — AB (ref 39.0–52.0)
HEMOGLOBIN: 12.9 g/dL — AB (ref 13.0–17.0)
MCH: 35.9 pg — ABNORMAL HIGH (ref 26.0–34.0)
MCHC: 34.3 g/dL (ref 30.0–36.0)
MCV: 104.7 fL — ABNORMAL HIGH (ref 78.0–100.0)
Platelets: 137 10*3/uL — ABNORMAL LOW (ref 150–400)
RBC: 3.59 MIL/uL — AB (ref 4.22–5.81)
RDW: 13.3 % (ref 11.5–15.5)
WBC: 8.2 10*3/uL (ref 4.0–10.5)

## 2014-10-25 MED ORDER — DILTIAZEM HCL 30 MG PO TABS
30.0000 mg | ORAL_TABLET | Freq: Four times a day (QID) | ORAL | Status: DC
  Filled 2014-10-25 (×4): qty 1

## 2014-10-25 MED ORDER — ENOXAPARIN SODIUM 40 MG/0.4ML ~~LOC~~ SOLN
40.0000 mg | SUBCUTANEOUS | Status: DC
  Administered 2014-10-26 – 2014-10-27 (×2): 40 mg via SUBCUTANEOUS
  Filled 2014-10-25 (×3): qty 0.4

## 2014-10-25 MED ORDER — DILTIAZEM HCL 60 MG PO TABS
60.0000 mg | ORAL_TABLET | Freq: Four times a day (QID) | ORAL | Status: DC
  Administered 2014-10-25 – 2014-10-27 (×9): 60 mg via ORAL
  Filled 2014-10-25 (×13): qty 1

## 2014-10-25 MED ORDER — METOPROLOL TARTRATE 1 MG/ML IV SOLN: 5.0000 mg | INTRAVENOUS | Status: DC | PRN

## 2014-10-25 NOTE — Progress Notes (Signed)
PATIENT DETAILS Name: [name] Page Age: 81 y.o. Sex: male Date of Birth: 10-02-1924 Admit Date: 10/23/2014 Admitting Physician Ron Parker, MD PCP:No primary care provider on file.  Subjective: Very hard of hearing-but denies chest pain, SOB, Nausea/vomting. Per RN uneventful night. Off cardizem gtt  Assessment/Plan: Principal Problem: Sepsis:secondary to UTI. Sepsis pathophysiology has resolved.  Continue IV Aztreonam,  Prelim urine culture shows E Coli.  Active Problems: UTI: as above-clinically improved  Afib RVR: rate much better controlled, off Cardizem infusion. Increase Cardizem to 60 mg, add as needed IV Lopressor.  Not a candidate for anticoagulation-given prior hx of GI bleed and SDH (reviewed prior d/c summary 2014).   Acute on CKD stage 3:ARF secondary to pre-renal azotemia in a setting of sepsis. Creatinine improved, now euvolemic and tolerating diet-stop IV fluids and monitor electrolytes.   Essential ZOX:[id number] cardizem  Disposition: Remain inpatient-if rate remains controlled-transfer to MedSurg unit dated today. Suspect back to SNF in 1-2 days  Antimicrobial agents  See below  Anti-infectives    Start     Dose/Rate Route Frequency Ordered Stop   10/25/14 2000  levofloxacin (LEVAQUIN) IVPB 750 mg  Status:  Discontinued     750 mg 100 mL/hr over 90 Minutes Intravenous Every 48 hours 10/23/14 2248 10/24/14 1428   10/24/14 1000  aztreonam (AZACTAM) 1 g in dextrose 5 % 50 mL IVPB     1 g 100 mL/hr over 30 Minutes Intravenous Every 12 hours 10/23/14 2248     10/23/14 2230  levofloxacin (LEVAQUIN) IVPB 750 mg     750 mg 100 mL/hr over 90 Minutes Intravenous  Once 10/23/14 2224 10/23/14 2335   10/23/14 2230  aztreonam (AZACTAM) 2 g in dextrose 5 % 50 mL IVPB     2 g 100 mL/hr over 30 Minutes Intravenous STAT 10/23/14 2224 10/23/14 2357      DVT Prophylaxis: Prophylactic Lovenox   Code Status: DNR  Family  Communication None at bedside  Procedures: None  CONSULTS:  None  Time spent 30 minutes-Greater than 50% of this time was spent in counseling, explanation of diagnosis, planning of further management, and coordination of care.  MEDICATIONS: Scheduled Meds: . aztreonam  1 g Intravenous Q12H  . diltiazem  60 mg Oral 4 times per day  . enoxaparin (LOVENOX) injection  30 mg Subcutaneous Q24H  . famotidine  20 mg Oral QHS  . metoprolol tartrate  25 mg Oral BID  . senna  1 tablet Oral BID   Continuous Infusions:   PRN Meds:.acetaminophen **OR** acetaminophen, alum & mag hydroxide-simeth, HYDROmorphone (DILAUDID) injection, metoprolol, ondansetron **OR** ondansetron (ZOFRAN) IV    PHYSICAL EXAM: Vital signs in last 24 hours: Filed Vitals:   10/25/14 0300 10/25/14 0400 10/25/14 0554 10/25/14 0705  BP: 134/88 143/87 117/73 130/83  Pulse: 93 99    Temp:  98.3 F (36.8 C)  98.5 F (36.9 C)  TempSrc:  Oral  Oral  Resp: 21 21    Height:      Weight:      SpO2: 92% 93%      Weight change:  Filed Weights   10/23/14 2019 10/24/14 0020  Weight: 79.379 kg (175 lb) 78.4 kg (172 lb 13.5 oz)   Body mass index is 24.12 kg/(m^2).   Gen Exam: Awake and alert with clear speech-very hard of hearing.  Neck: Supple, No JVD.   Chest: B/L Clear.  CVS: S1 S2 irregular, no murmurs.  Abdomen: soft, BS +, non tender, non distended.  Extremities: no edema, lower extremities warm to touch. Neurologic: Non Focal.   Skin: No Rash.  Wounds: N/A.    Intake/Output from previous day:  Intake/Output Summary (Last 24 hours) at 10/25/14 0839 Last data filed at 10/25/14 0709  Gross per 24 hour  Intake   1260 ml  Output   1150 ml  Net    110 ml     LAB RESULTS: CBC  Recent Labs Lab 10/23/14 2040 10/24/14 0258 10/25/14 0321  WBC 10.9* 10.2 8.2  HGB 13.4 13.7 12.9*  HCT 39.2 39.8 37.6*  PLT 135* 143* 137*  MCV 104.0* 103.4* 104.7*  MCH 35.5* 35.6* 35.9*  MCHC 34.2 34.4 34.3  RDW  13.2 13.3 13.3  LYMPHSABS 0.7  --   --   MONOABS 1.4*  --   --   EOSABS 0.0  --   --   BASOSABS 0.0  --   --     Chemistries   Recent Labs Lab 10/23/14 2040 10/24/14 0258 10/25/14 0321  NA 137 135 137  K 4.1 4.3 3.8  CL 107 105 106  CO2 22 20* 21*  GLUCOSE 136* 97 95  BUN 19 18 21*  CREATININE 1.78* 1.65* 1.57*  CALCIUM 8.6* 8.4* 8.6*    CBG: No results for input(s): GLUCAP in the last 168 hours.  GFR Estimated Creatinine Clearance: 34.6 mL/min (by C-G formula based on Cr of 1.57).  Coagulation profile  Recent Labs Lab 10/23/14 2355  INR 1.26    Cardiac Enzymes No results for input(s): CKMB, TROPONINI, MYOGLOBIN in the last 168 hours.  Invalid input(s): CK  Invalid input(s): POCBNP No results for input(s): DDIMER in the last 72 hours. No results for input(s): HGBA1C in the last 72 hours. No results for input(s): CHOL, HDL, LDLCALC, TRIG, CHOLHDL, LDLDIRECT in the last 72 hours. No results for input(s): TSH, T4TOTAL, T3FREE, THYROIDAB in the last 72 hours.  Invalid input(s): FREET3 No results for input(s): VITAMINB12, FOLATE, FERRITIN, TIBC, IRON, RETICCTPCT in the last 72 hours. No results for input(s): LIPASE, AMYLASE in the last 72 hours.  Urine Studies No results for input(s): UHGB, CRYS in the last 72 hours.  Invalid input(s): UACOL, UAPR, USPG, UPH, UTP, UGL, UKET, UBIL, UNIT, UROB, ULEU, UEPI, UWBC, URBC, UBAC, CAST, UCOM, BILUA  MICROBIOLOGY: Recent Results (from the past 240 hour(s))  Blood Culture (routine x 2)     Status: None (Preliminary result)   Collection Time: 10/23/14  8:40 PM  Result Value Ref Range Status   Specimen Description BLOOD RIGHT ARM  Final   Special Requests BOTTLES DRAWN AEROBIC AND ANAEROBIC  Final   Culture NO GROWTH < 24 HOURS  Final   Report Status PENDING  Incomplete  Blood Culture (routine x 2)     Status: None (Preliminary result)   Collection Time: 10/23/14  9:03 PM  Result Value Ref Range Status    Specimen Description BLOOD LEFT HAND  Final   Special Requests BOTTLES DRAWN AEROBIC AND ANAEROBIC  Final   Culture NO GROWTH < 24 HOURS  Final   Report Status PENDING  Incomplete  Urine culture     Status: None (Preliminary result)   Collection Time: 10/23/14  9:20 PM  Result Value Ref Range Status   Specimen Description URINE, CATHETERIZED  Final   Special Requests NONE  Final   Culture >=100,000 COLONIES/mL ESCHERICHIA COLI  Final  Report Status PENDING  Incomplete  MRSA PCR Screening     Status: None   Collection Time: 10/24/14  7:20 PM  Result Value Ref Range Status   MRSA by PCR NEGATIVE NEGATIVE Final    Comment:        The GeneXpert MRSA Assay (FDA approved for NASAL specimens only), is one component of a comprehensive MRSA colonization surveillance program. It is not intended to diagnose MRSA infection nor to guide or monitor treatment for MRSA infections.     RADIOLOGY STUDIES/RESULTS: Dg Chest Port 1 View  10/23/2014   CLINICAL DATA:  Fevers  EXAM: PORTABLE CHEST - 1 VIEW  COMPARISON:  08/25/2012  FINDINGS: Cardiac shadow is stable. The lungs are well aerated without focal infiltrate. Very minimal interstitial changes are noted which may be related to vascular congestion. No acute bony abnormality is seen.  IMPRESSION: Mild interstitial changes which may be related to CHF.   Electronically Signed   By: Alcide Clever M.D.   On: 10/23/2014 21:02    Jeoffrey Massed, MD  [facility] Pager:[phone]  If 7PM-7AM, please contact night-coverage [email or web address] Password [facility] 10/25/2014, 8:39 AM   LOS: 2 days

## 2014-10-25 NOTE — Progress Notes (Signed)
Critical labs called about blood cultures from 10/23/14, Aerobic bottle showed +gram negative rods. Called and spoke to Dr. Jerral Ralph . Will continue with current antibiotics. Will continue to monitor.

## 2014-10-26 ENCOUNTER — Encounter (HOSPITAL_COMMUNITY): Payer: Self-pay | Admitting: *Deleted

## 2014-10-26 DIAGNOSIS — A4151 Sepsis due to Escherichia coli [E. coli]: Principal | ICD-10-CM

## 2014-10-26 LAB — BASIC METABOLIC PANEL
ANION GAP: 11 (ref 5–15)
BUN: 19 mg/dL (ref 6–20)
CALCIUM: 8.8 mg/dL — AB (ref 8.9–10.3)
CO2: 20 mmol/L — ABNORMAL LOW (ref 22–32)
Chloride: 106 mmol/L (ref 101–111)
Creatinine, Ser: 1.45 mg/dL — ABNORMAL HIGH (ref 0.61–1.24)
GFR, EST AFRICAN AMERICAN: 48 mL/min — AB (ref >60)
GFR, EST NON AFRICAN AMERICAN: 41 mL/min — AB (ref >60)
GLUCOSE: 112 mg/dL — AB (ref 65–99)
Potassium: 3.4 mmol/L — ABNORMAL LOW (ref 3.5–5.1)
Sodium: 137 mmol/L (ref 135–145)

## 2014-10-26 MED ORDER — POTASSIUM CHLORIDE CRYS ER 20 MEQ PO TBCR
40.0000 meq | EXTENDED_RELEASE_TABLET | Freq: Once | ORAL | Status: AC
  Administered 2014-10-26: 40 meq via ORAL
  Filled 2014-10-26: qty 2

## 2014-10-26 MED ORDER — AZTREONAM 1 G IJ SOLR
1.0000 g | Freq: Three times a day (TID) | INTRAMUSCULAR | Status: DC
  Administered 2014-10-26 – 2014-10-27 (×2): 1 g via INTRAVENOUS
  Filled 2014-10-26 (×6): qty 1

## 2014-10-26 MED ORDER — METOPROLOL TARTRATE 25 MG PO TABS
25.0000 mg | ORAL_TABLET | Freq: Two times a day (BID) | ORAL | Status: DC
  Administered 2014-10-26 – 2014-10-27 (×3): 25 mg via ORAL
  Filled 2014-10-26 (×4): qty 1

## 2014-10-26 NOTE — Care Management Important Message (Signed)
Important Message  Patient Details  Name: [name] Page MRN: [id number] Date of Birth: August 27, 1924   Medicare Important Message Given:  Yes-second notification given    Kyla Balzarine 10/26/2014, 11:24 AMImportant Message  Patient Details  Name: [name] Page MRN: [id number] Date of Birth: Mar 07, 1925   Medicare Important Message Given:  Yes-second notification given    Kyla Balzarine 10/26/2014, 11:24 AM

## 2014-10-26 NOTE — Progress Notes (Addendum)
Pt transferred to [id number] with RN and NT on tele. Son, Dorma Russell made aware.

## 2014-10-26 NOTE — Evaluation (Signed)
Physical Therapy Evaluation Patient Details Name: [name] Page MRN: [id number] DOB: [birth year] Today's Date: 10/26/2014   History of Present Illness  81 y.o. male with a history of Atrial Fibrillation, HTN, Stage II CKD who was sent from the [facility] to the ED due to fever. Found to have sepsis with UTI.  Clinical Impression  Patient demonstrates deficits in functional mobility as indicated below. Will need continued skilled PT to address deficits and maximize function. Will see as indicated and progress as tolerated.    Follow Up Recommendations SNF;Supervision/Assistance - 24 hour    Equipment Recommendations  None recommended by PT    Recommendations for Other Services       Precautions / Restrictions Precautions Precautions: Fall Restrictions Weight Bearing Restrictions: No      Mobility  Bed Mobility Overal bed mobility: Needs Assistance Bed Mobility: Supine to Sit;Sit to Supine     Supine to sit: Min assist Sit to supine: Min assist   General bed mobility comments: Min assist to pull to upright at EOB, increased time to perform  Transfers Overall transfer level: Needs assistance Equipment used: 2 person hand held assist Transfers: Sit to/from Stand Sit to Stand: +2 physical assistance;Min assist         General transfer comment: ASsist for stability  Ambulation/Gait Ambulation/Gait assistance: Mod assist;+2 physical assistance Ambulation Distance (Feet): 3 Feet Assistive device: 2 person hand held assist Gait Pattern/deviations: Step-to pattern;Shuffle Gait velocity: nonfunctional   General Gait Details: patient with pain and moaning when weight bearing on RLE  Stairs            Wheelchair Mobility    Modified Rankin (Stroke Patients Only)       Balance Overall balance assessment: History of Falls                                           Pertinent Vitals/Pain Pain Assessment: Faces Faces Pain Scale: Hurts  little more Pain Location: right leg Pain Descriptors / Indicators: Guarding;Grimacing;Moaning Pain Intervention(s): Monitored during session;Limited activity within patient's tolerance;Repositioned    Home Living Family/patient expects to be discharged to:: Skilled nursing facility                      Prior Function Level of Independence: Needs assistance   Gait / Transfers Assistance Needed: w/c bound at baseline but can transfer to and from w/c     Comments: no family present to determine baseline, per nsg from conversations with son- pt can transfer to/from w/c independently     Hand Dominance   Dominant Hand: Right    Extremity/Trunk Assessment   Upper Extremity Assessment: Generalized weakness           Lower Extremity Assessment: Generalized weakness;RLE deficits/detail         Communication   Communication: HOH  Cognition Arousal/Alertness: Awake/alert Behavior During Therapy: WFL for tasks assessed/performed Overall Cognitive Status: Difficult to assess                      General Comments  Patient very pleasant throughout session.     Exercises        Assessment/Plan    PT Assessment Patient needs continued PT services  PT Diagnosis Generalized weakness;Acute pain   PT Problem List Decreased strength;Decreased activity tolerance;Decreased balance;Decreased mobility;Decreased coordination  PT Treatment Interventions  DME instruction;Functional mobility training;Therapeutic activities;Therapeutic exercise;Balance training   PT Goals (Current goals can be found in the Care Plan section) Acute Rehab PT Goals Patient Stated Goal: to go home  PT Goal Formulation: With patient Time For Goal Achievement: 11/09/14 Potential to Achieve Goals: Good    Frequency Min 2X/week   Barriers to discharge        Co-evaluation               End of Session Equipment Utilized During Treatment: Gait belt Activity Tolerance: Patient  tolerated treatment well;No increased pain Patient left: in bed;with call bell/phone within reach;with bed alarm set Nurse Communication: Mobility status         Time: 9562-1308 PT Time Calculation (min) (ACUTE ONLY): 18 min   Charges:   PT Evaluation $Initial PT Evaluation Tier I: 1 Procedure     PT G CodesFabio Asa 11/19/14, 4:52 PM Charlotte Crumb, PT DPT  [phone]

## 2014-10-26 NOTE — Progress Notes (Signed)
PATIENT DETAILS Name: [name] Page Age: 81 y.o. Sex: male Date of Birth: Aug 28, 1924 Admit Date: 10/23/2014 Admitting Physician Ron Parker, MD PCP:No primary care provider on file.  Subjective: Denies chest pain, SOB, Nausea/vomting. Per RN uneventful night.   Assessment/Plan: Principal Problem: Sepsis:secondary to UTI/Bacteremia. Sepsis pathophysiology has resolved.  Continue IV Aztreonam,  Urine culture shows E Coli-await blood cultures. If Clinical improvement continues-suspect can be transitioned to oral antibiotics and discharged on 8/16.  Active Problems: Escherichia coli bacteremia with UTI: as above-clinically improved  Afib RVR: rate much better controlled, off Cardizem infusion. Continue Cardizem to 60 mg, add low dose metoprolol.  Not a candidate for anticoagulation-given prior hx of GI bleed and SDH (reviewed prior d/c summary 2014).   Acute on CKD stage 3:ARF secondary to pre-renal azotemia in a setting of sepsis. Creatinine improving, follow lytes periodically  Essential FAO:[id number] cardizem  Disposition: Remain inpatient-transfer to MedSurg unit dated today. Suspect back to SNF in am  Antimicrobial agents  See below  Anti-infectives    Start     Dose/Rate Route Frequency Ordered Stop   10/25/14 2000  levofloxacin (LEVAQUIN) IVPB 750 mg  Status:  Discontinued     750 mg 100 mL/hr over 90 Minutes Intravenous Every 48 hours 10/23/14 2248 10/24/14 1428   10/24/14 1000  aztreonam (AZACTAM) 1 g in dextrose 5 % 50 mL IVPB     1 g 100 mL/hr over 30 Minutes Intravenous Every 12 hours 10/23/14 2248     10/23/14 2230  levofloxacin (LEVAQUIN) IVPB 750 mg     750 mg 100 mL/hr over 90 Minutes Intravenous  Once 10/23/14 2224 10/23/14 2335   10/23/14 2230  aztreonam (AZACTAM) 2 g in dextrose 5 % 50 mL IVPB     2 g 100 mL/hr over 30 Minutes Intravenous STAT 10/23/14 2224 10/23/14 2357      DVT Prophylaxis: Prophylactic Lovenox    Code Status: DNR  Family Communication Spouse over the phone  Procedures: None  CONSULTS:  None  Time spent 20 minutes-Greater than 50% of this time was spent in counseling, explanation of diagnosis, planning of further management, and coordination of care.  MEDICATIONS: Scheduled Meds: . aztreonam  1 g Intravenous Q12H  . diltiazem  60 mg Oral 4 times per day  . enoxaparin (LOVENOX) injection  40 mg Subcutaneous Q24H  . famotidine  20 mg Oral QHS  . metoprolol tartrate  25 mg Oral BID  . senna  1 tablet Oral BID   Continuous Infusions:   PRN Meds:.acetaminophen **OR** acetaminophen, alum & mag hydroxide-simeth, HYDROmorphone (DILAUDID) injection, metoprolol, ondansetron **OR** ondansetron (ZOFRAN) IV    PHYSICAL EXAM: Vital signs in last 24 hours: Filed Vitals:   10/26/14 0836 10/26/14 0838 10/26/14 0900 10/26/14 1133  BP:  115/76 114/78   Pulse:  110 109   Temp: 98.7 F (37.1 C)   98.5 F (36.9 C)  TempSrc: Oral   Oral  Resp:   24   Height:      Weight:      SpO2:   97%     Weight change:  Filed Weights   10/23/14 2019 10/24/14 0020  Weight: 79.379 kg (175 lb) 78.4 kg (172 lb 13.5 oz)   Body mass index is 24.12 kg/(m^2).   Gen Exam: Awake and alert with clear speech-very hard of hearing.  Neck: Supple, No JVD.   Chest: B/L Clear.  CVS: S1 S2 irregular, no murmurs.  Abdomen: soft, BS +, non tender, non distended.  Extremities: no edema, lower extremities warm to touch. Neurologic: Non Focal.   Skin: No Rash.  Wounds: N/A.    Intake/Output from previous day:  Intake/Output Summary (Last 24 hours) at 10/26/14 1153 Last data filed at 10/26/14 0007  Gross per 24 hour  Intake    340 ml  Output    625 ml  Net   -285 ml     LAB RESULTS: CBC  Recent Labs Lab 10/23/14 2040 10/24/14 0258 10/25/14 0321  WBC 10.9* 10.2 8.2  HGB 13.4 13.7 12.9*  HCT 39.2 39.8 37.6*  PLT 135* 143* 137*  MCV 104.0* 103.4* 104.7*  MCH 35.5* 35.6* 35.9*   MCHC 34.2 34.4 34.3  RDW 13.2 13.3 13.3  LYMPHSABS 0.7  --   --   MONOABS 1.4*  --   --   EOSABS 0.0  --   --   BASOSABS 0.0  --   --     Chemistries   Recent Labs Lab 10/23/14 2040 10/24/14 0258 10/25/14 0321 10/26/14 0228  NA 137 135 137 137  K 4.1 4.3 3.8 3.4*  CL 107 105 106 106  CO2 22 20* 21* 20*  GLUCOSE 136* 97 95 112*  BUN 19 18 21* 19  CREATININE 1.78* 1.65* 1.57* 1.45*  CALCIUM 8.6* 8.4* 8.6* 8.8*    CBG: No results for input(s): GLUCAP in the last 168 hours.  GFR Estimated Creatinine Clearance: 37.5 mL/min (by C-G formula based on Cr of 1.45).  Coagulation profile  Recent Labs Lab 10/23/14 2355  INR 1.26    Cardiac Enzymes No results for input(s): CKMB, TROPONINI, MYOGLOBIN in the last 168 hours.  Invalid input(s): CK  Invalid input(s): POCBNP No results for input(s): DDIMER in the last 72 hours. No results for input(s): HGBA1C in the last 72 hours. No results for input(s): CHOL, HDL, LDLCALC, TRIG, CHOLHDL, LDLDIRECT in the last 72 hours. No results for input(s): TSH, T4TOTAL, T3FREE, THYROIDAB in the last 72 hours.  Invalid input(s): FREET3 No results for input(s): VITAMINB12, FOLATE, FERRITIN, TIBC, IRON, RETICCTPCT in the last 72 hours. No results for input(s): LIPASE, AMYLASE in the last 72 hours.  Urine Studies No results for input(s): UHGB, CRYS in the last 72 hours.  Invalid input(s): UACOL, UAPR, USPG, UPH, UTP, UGL, UKET, UBIL, UNIT, UROB, ULEU, UEPI, UWBC, URBC, UBAC, CAST, UCOM, BILUA  MICROBIOLOGY: Recent Results (from the past 240 hour(s))  Blood Culture (routine x 2)     Status: None (Preliminary result)   Collection Time: 10/23/14  8:40 PM  Result Value Ref Range Status   Specimen Description BLOOD RIGHT ARM  Final   Special Requests BOTTLES DRAWN AEROBIC AND ANAEROBIC  Final   Culture  Setup Time   Final    GRAM NEGATIVE RODS AEROBIC BOTTLE ONLY CRITICAL RESULT CALLED TO, READ BACK BY AND VERIFIED WITH: D FIELDS  10/25/14 @ 1521 M VESTAL    Culture ESCHERICHIA COLI  Final   Report Status PENDING  Incomplete  Blood Culture (routine x 2)     Status: None (Preliminary result)   Collection Time: 10/23/14  9:03 PM  Result Value Ref Range Status   Specimen Description BLOOD LEFT HAND  Final   Special Requests BOTTLES DRAWN AEROBIC AND ANAEROBIC  Final   Culture NO GROWTH 2 DAYS  Final   Report Status PENDING  Incomplete  Urine culture  Status: None   Collection Time: 10/23/14  9:20 PM  Result Value Ref Range Status   Specimen Description URINE, CATHETERIZED  Final   Special Requests NONE  Final   Culture >=100,000 COLONIES/mL ESCHERICHIA COLI  Final   Report Status 10/25/2014 FINAL  Final   Organism ID, Bacteria ESCHERICHIA COLI  Final      Susceptibility   Escherichia coli - MIC*    AMPICILLIN >=32 RESISTANT Resistant     CEFAZOLIN <=4 SENSITIVE Sensitive     CEFTRIAXONE <=1 SENSITIVE Sensitive     CIPROFLOXACIN <=0.25 SENSITIVE Sensitive     GENTAMICIN <=1 SENSITIVE Sensitive     IMIPENEM <=0.25 SENSITIVE Sensitive     NITROFURANTOIN <=16 SENSITIVE Sensitive     TRIMETH/SULFA >=320 RESISTANT Resistant     AMPICILLIN/SULBACTAM 4 SENSITIVE Sensitive     PIP/TAZO <=4 SENSITIVE Sensitive     * >=100,000 COLONIES/mL ESCHERICHIA COLI  MRSA PCR Screening     Status: None   Collection Time: 10/24/14  7:20 PM  Result Value Ref Range Status   MRSA by PCR NEGATIVE NEGATIVE Final    Comment:        The GeneXpert MRSA Assay (FDA approved for NASAL specimens only), is one component of a comprehensive MRSA colonization surveillance program. It is not intended to diagnose MRSA infection nor to guide or monitor treatment for MRSA infections.     RADIOLOGY STUDIES/RESULTS: Dg Chest Port 1 View  10/23/2014   CLINICAL DATA:  Fevers  EXAM: PORTABLE CHEST - 1 VIEW  COMPARISON:  08/25/2012  FINDINGS: Cardiac shadow is stable. The lungs are well aerated without focal infiltrate. Very minimal  interstitial changes are noted which may be related to vascular congestion. No acute bony abnormality is seen.  IMPRESSION: Mild interstitial changes which may be related to CHF.   Electronically Signed   By: Alcide Clever M.D.   On: 10/23/2014 21:02    Jeoffrey Massed, MD  [facility] Pager:[phone]  If 7PM-7AM, please contact night-coverage [email or web address] Password [email or web address] 10/26/2014, 11:53 AM   LOS: 3 days

## 2014-10-27 DIAGNOSIS — R7881 Bacteremia: Secondary | ICD-10-CM

## 2014-10-27 LAB — CULTURE, BLOOD (ROUTINE X 2)

## 2014-10-27 MED ORDER — CIPROFLOXACIN HCL 500 MG PO TABS: 500.0000 mg | ORAL_TABLET | Freq: Every day | ORAL | Status: AC

## 2014-10-27 MED ORDER — METOPROLOL TARTRATE 25 MG PO TABS: 25.0000 mg | ORAL_TABLET | Freq: Two times a day (BID) | ORAL | Status: AC

## 2014-10-27 MED ORDER — CIPROFLOXACIN HCL 500 MG PO TABS
500.0000 mg | ORAL_TABLET | Freq: Every day | ORAL | Status: DC
  Administered 2014-10-27: 500 mg via ORAL
  Filled 2014-10-27 (×2): qty 1

## 2014-10-27 MED ORDER — TRAMADOL HCL 50 MG PO TABS: 50.0000 mg | ORAL_TABLET | Freq: Three times a day (TID) | ORAL | Status: DC | PRN

## 2014-10-27 MED ORDER — DILTIAZEM HCL 30 MG PO TABS: 60.0000 mg | ORAL_TABLET | Freq: Four times a day (QID) | ORAL | Status: AC

## 2014-10-27 NOTE — Care Management Note (Signed)
Case Management Note  Patient Details  Name: [name] Page MRN: [id number] Date of Birth: 27-Oct-1924  Subjective/Objective:                 Discharge to SNF, patient from SNF   Action/Plan:   Expected Discharge Date:                  Expected Discharge Plan:  Skilled Nursing Facility  In-House Referral:  Clinical Social Work  Discharge planning Services  CM Consult  Post Acute Care Choice:    Choice offered to:     DME Arranged:    DME Agency:     HH Arranged:    HH Agency:     Status of Service:  Completed, signed off  Medicare Important Message Given:  Yes-second notification given Date Medicare IM Given:    Medicare IM give by:    Date Additional Medicare IM Given:    Additional Medicare Important Message give by:     If discussed at Long Length of Stay Meetings, dates discussed:    Additional Comments:  Lawerance Sabal, RN 10/27/2014, 10:17 AM

## 2014-10-27 NOTE — Clinical Social Work Note (Signed)
CSW spoke with patient's wife by phone to notify that patient is going back to [facility] today. CSW unable to complete full psychosocial with patient and family today prior to DC. CSW signing off.   Roddie Mc MSW, [place], [place], [phone]

## 2014-10-27 NOTE — Clinical Social Work Note (Signed)
Per MD patient ready to DC back to [facility]. RN, patient/family (CSW only able to reach patient's daughter in law by phone), and facility notified of patient's DC. RN given number for report. DC packet on patient's chart. Ambulance transport requested for patient. CSW signing off at this time.   Roddie Mc MSW, [place], [place], [phone]

## 2014-10-27 NOTE — Progress Notes (Signed)
Utilization Review completed. Cedrick Partain RN BSN CM 
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
## 2014-10-27 NOTE — Progress Notes (Signed)
Speech Language Pathology Treatment: Dysphagia  Patient Details Name: [name] Page MRN: [id number] DOB: [birth year] Today's Date: 10/27/2014 Time: 0415-9301 SLP Time Calculation (min) (ACUTE ONLY): 11 min  Assessment / Plan / Recommendation Clinical Impression  Skilled treatment session focused on addressing dysphagia goals.  SLP provided patient with set up of thin liquid via straw and regular textures, which patient consumed with increased time due to being edentulous.  Mild reside present post swallow; however, cleared with a liquid sip.  Patient reports that his dentures are still at home.  As a result, recommend to continue with current diet orders and resume regular textures once at home with dentures.  As a result, no further skilled SLP services are warranted at this time.  Education complete and SLP signing off.     HPI Other Pertinent Information: [name] is a 81 y.o. male with a history of Atrial Fibrillation, HTN, Stage II CKD who was sent from the [facility] SNF to the ED due to fever to 102.0 this afternoon. He was given Tylenol before transport by EMS to the ED . He was found to have Atrial fibrillation with a rate of 168 and was given IV Cardizem 10 mg X 1 dose. He was further evaluated and found to have a UTI and a Sepsis Workup was initiated. He was placed on IV Levaquin and Aztreonam and referred for admission. Current chest x-ray is most consistent with CHF.      Pertinent Vitals Pain Assessment: No/denies pain  SLP Plan  All goals met    Recommendations Diet recommendations: Dysphagia 3 (mechanical soft);Thin liquid Liquids provided via: Straw;Cup Medication Administration: Whole meds with liquid Supervision: Patient able to self feed Compensations: Slow rate;Small sips/bites Postural Changes and/or Swallow Maneuvers: Seated upright 90 degrees              Oral Care Recommendations: Oral care BID Follow up Recommendations: None Plan: All goals met     GO    Carmelia Roller., CCC-SLP [phone]  Linken Mcglothen 10/27/2014, 11:25 AM

## 2014-10-28 DIAGNOSIS — R627 Adult failure to thrive: Secondary | ICD-10-CM | POA: Diagnosis not present

## 2014-10-29 DIAGNOSIS — R1310 Dysphagia, unspecified: Secondary | ICD-10-CM | POA: Diagnosis not present

## 2014-10-29 LAB — CULTURE, BLOOD (ROUTINE X 2)

## 2014-11-06 DIAGNOSIS — Z79899 Other long term (current) drug therapy: Secondary | ICD-10-CM | POA: Diagnosis not present

## 2014-11-06 DIAGNOSIS — N390 Urinary tract infection, site not specified: Secondary | ICD-10-CM | POA: Diagnosis not present

## 2014-11-23 DIAGNOSIS — H6123 Impacted cerumen, bilateral: Secondary | ICD-10-CM | POA: Diagnosis not present

## 2014-11-23 DIAGNOSIS — H9193 Unspecified hearing loss, bilateral: Secondary | ICD-10-CM | POA: Diagnosis not present

## 2014-11-27 DIAGNOSIS — N390 Urinary tract infection, site not specified: Secondary | ICD-10-CM | POA: Diagnosis not present

## 2015-01-07 DIAGNOSIS — I69398 Other sequelae of cerebral infarction: Secondary | ICD-10-CM | POA: Diagnosis not present

## 2015-01-07 DIAGNOSIS — R293 Abnormal posture: Secondary | ICD-10-CM | POA: Diagnosis not present

## 2015-01-12 DIAGNOSIS — R293 Abnormal posture: Secondary | ICD-10-CM | POA: Diagnosis not present

## 2015-01-12 DIAGNOSIS — I69398 Other sequelae of cerebral infarction: Secondary | ICD-10-CM | POA: Diagnosis not present

## 2015-01-13 DIAGNOSIS — R293 Abnormal posture: Secondary | ICD-10-CM | POA: Diagnosis not present

## 2015-01-13 DIAGNOSIS — I69398 Other sequelae of cerebral infarction: Secondary | ICD-10-CM | POA: Diagnosis not present

## 2015-01-15 DIAGNOSIS — R293 Abnormal posture: Secondary | ICD-10-CM | POA: Diagnosis not present

## 2015-01-15 DIAGNOSIS — I69398 Other sequelae of cerebral infarction: Secondary | ICD-10-CM | POA: Diagnosis not present

## 2015-01-18 DIAGNOSIS — R293 Abnormal posture: Secondary | ICD-10-CM | POA: Diagnosis not present

## 2015-01-18 DIAGNOSIS — I69398 Other sequelae of cerebral infarction: Secondary | ICD-10-CM | POA: Diagnosis not present

## 2015-01-19 DIAGNOSIS — I69398 Other sequelae of cerebral infarction: Secondary | ICD-10-CM | POA: Diagnosis not present

## 2015-01-19 DIAGNOSIS — R293 Abnormal posture: Secondary | ICD-10-CM | POA: Diagnosis not present

## 2015-03-12 DIAGNOSIS — I4891 Unspecified atrial fibrillation: Secondary | ICD-10-CM | POA: Diagnosis not present

## 2015-03-12 DIAGNOSIS — N184 Chronic kidney disease, stage 4 (severe): Secondary | ICD-10-CM | POA: Diagnosis not present

## 2015-03-12 DIAGNOSIS — I639 Cerebral infarction, unspecified: Secondary | ICD-10-CM | POA: Diagnosis not present

## 2015-03-12 DIAGNOSIS — E559 Vitamin D deficiency, unspecified: Secondary | ICD-10-CM | POA: Diagnosis not present

## 2015-03-15 DIAGNOSIS — I4891 Unspecified atrial fibrillation: Secondary | ICD-10-CM | POA: Diagnosis not present

## 2015-03-15 DIAGNOSIS — M1830 Unilateral post-traumatic osteoarthritis of first carpometacarpal joint, unspecified hand: Secondary | ICD-10-CM | POA: Diagnosis not present

## 2015-03-15 DIAGNOSIS — N390 Urinary tract infection, site not specified: Secondary | ICD-10-CM | POA: Diagnosis not present

## 2015-03-15 DIAGNOSIS — E559 Vitamin D deficiency, unspecified: Secondary | ICD-10-CM | POA: Diagnosis not present

## 2015-03-17 DIAGNOSIS — N19 Unspecified kidney failure: Secondary | ICD-10-CM | POA: Diagnosis not present

## 2015-04-05 IMAGING — CT CT HEAD W/O CM
1 of 2 series · 16 of 30 positions shown, 20 images · non-contrast
Comparison: 04/22/2005.

CLINICAL DATA: Altered mental status and ataxia.

CT HEAD WITHOUT CONTRAST
TECHNIQUE: Contiguous axial images were obtained from the base of
the skull through the vertex without contrast.

[Series 2: head routine 4.8 h37s · axial · 0.43mm/px · z∈[+1084,+1236]mm · 16 of 36 slices shown, 20 images]
[im 2/36  brain]
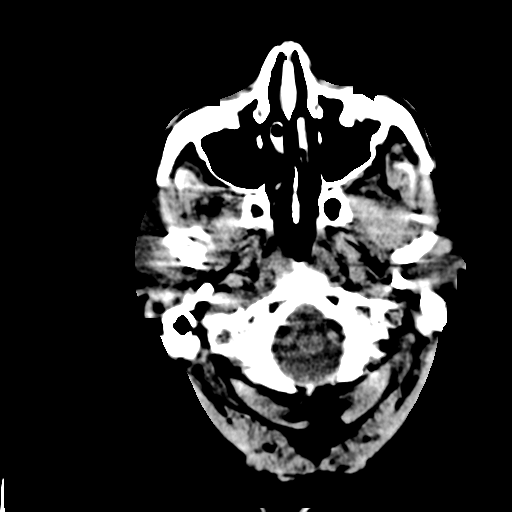
[im 2/36  bone]
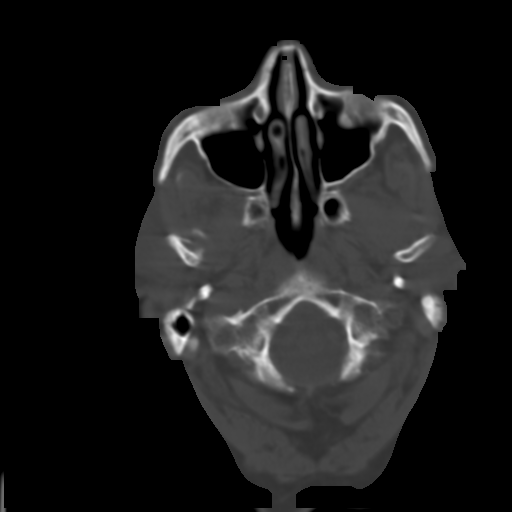
[im 4/36  brain]
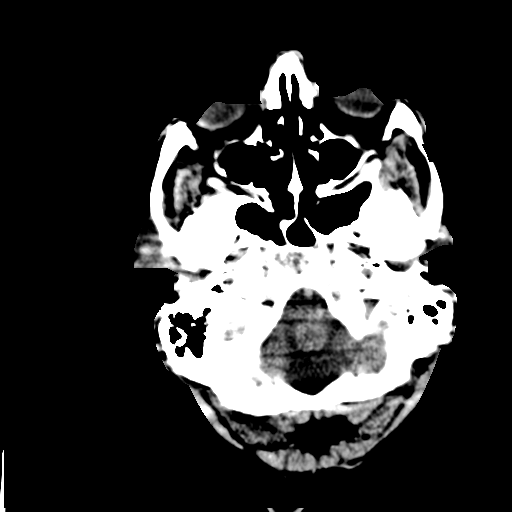
[im 7/36  brain]
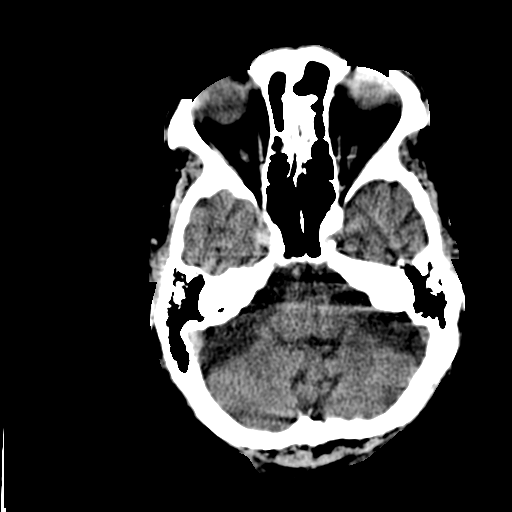
[im 9/36  brain]
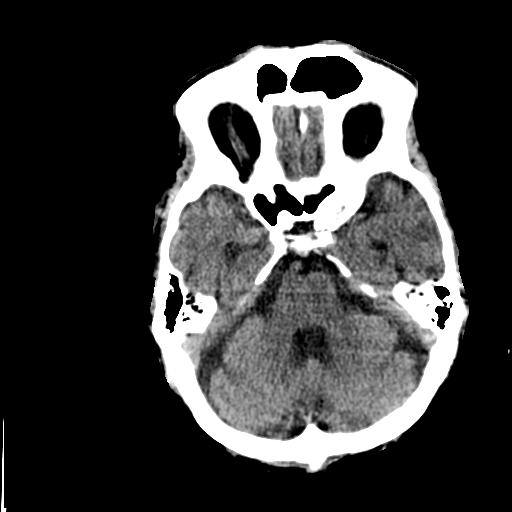
[im 11/36  brain]
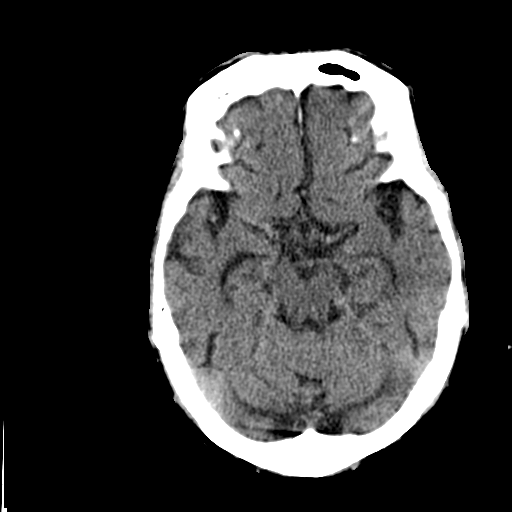
[im 11/36  bone]
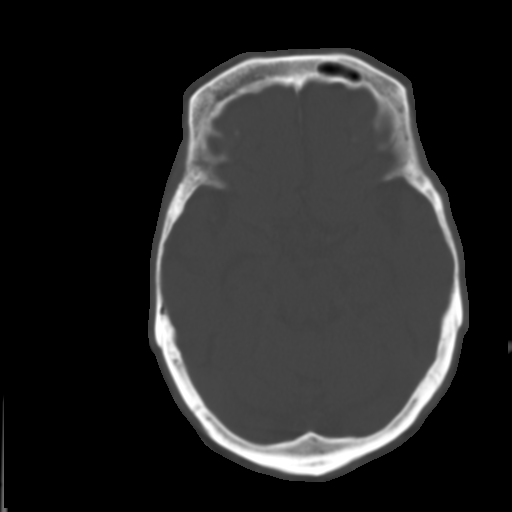
[im 12/36  brain]
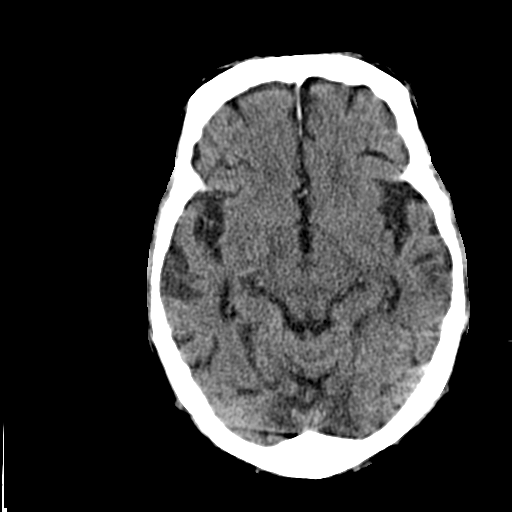
[im 16/36  brain]
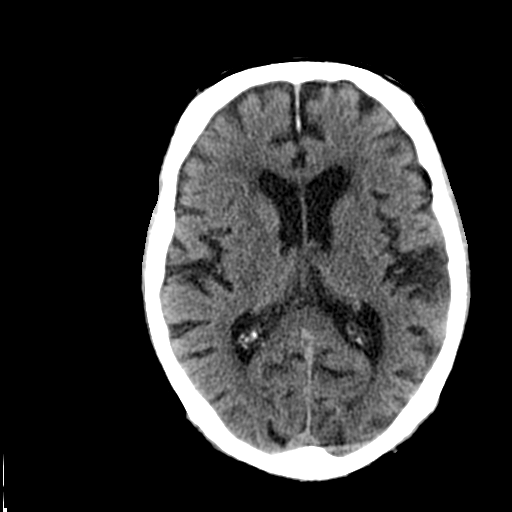
[im 17/36  brain]
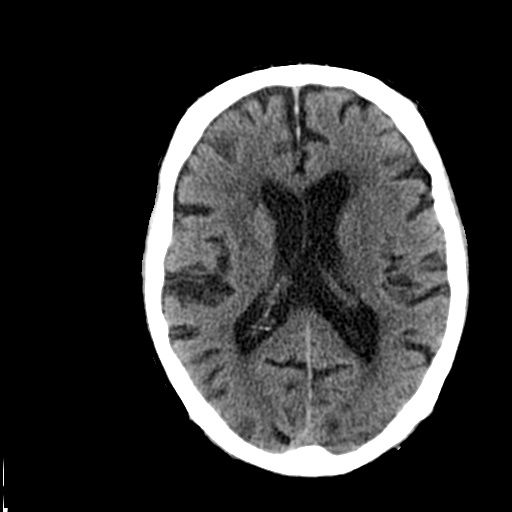
[im 19/36  brain]
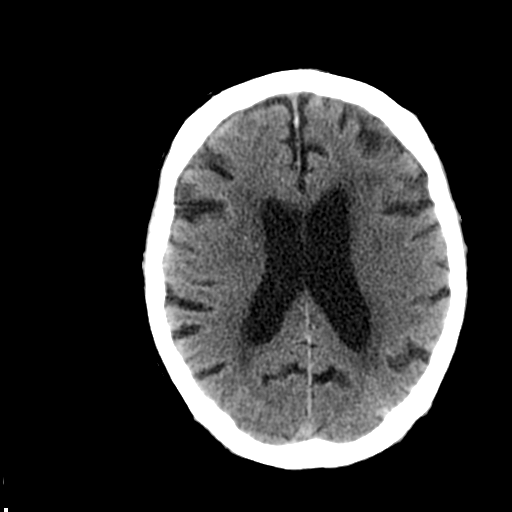
[im 19/36  bone]
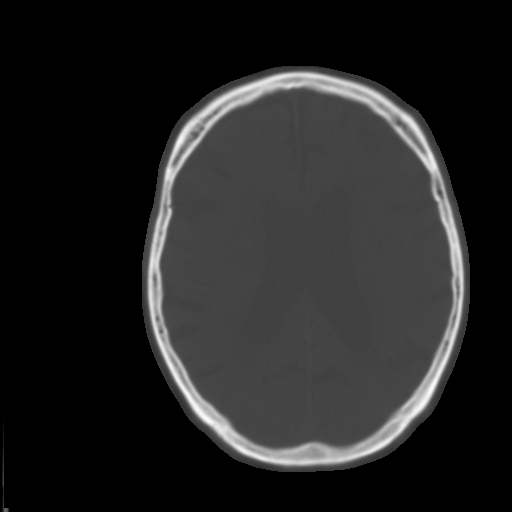
[im 21/36  brain]
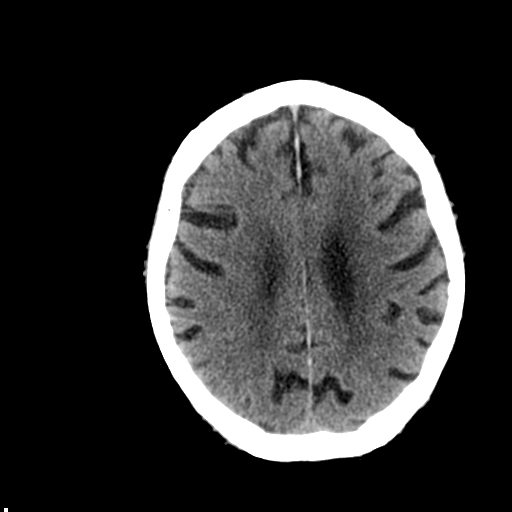
[im 24/36  brain]
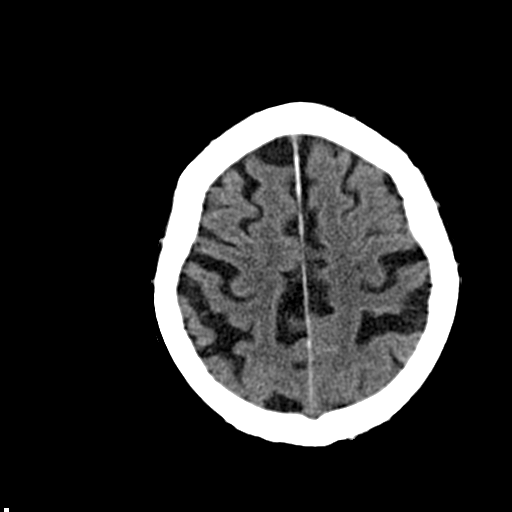
[im 26/36  brain]
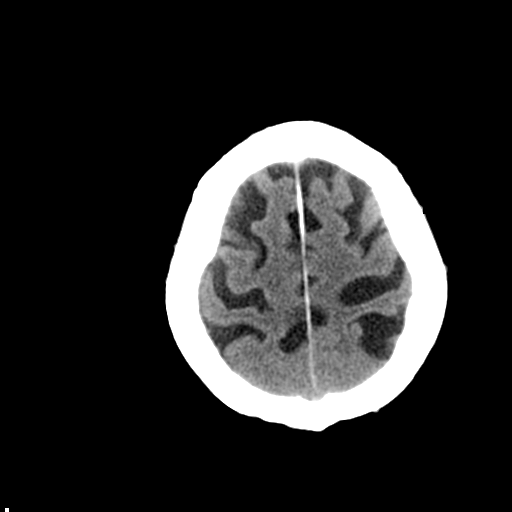
[im 27/36  brain]
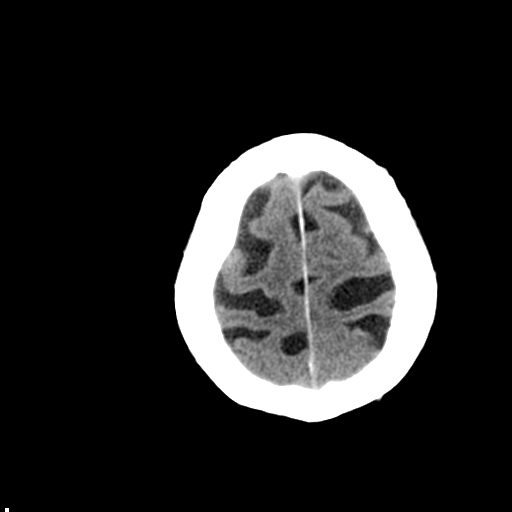
[im 27/36  bone]
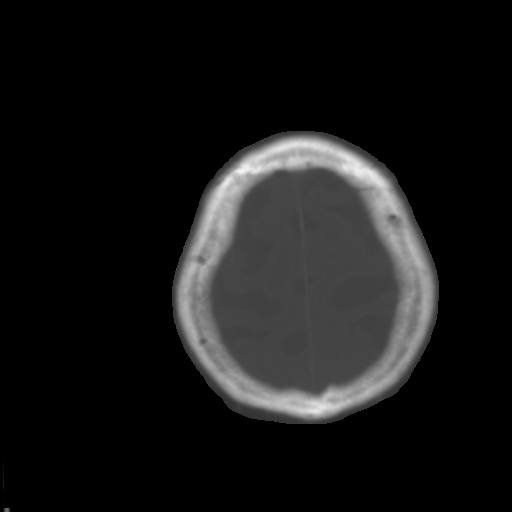
[im 29/36  brain]
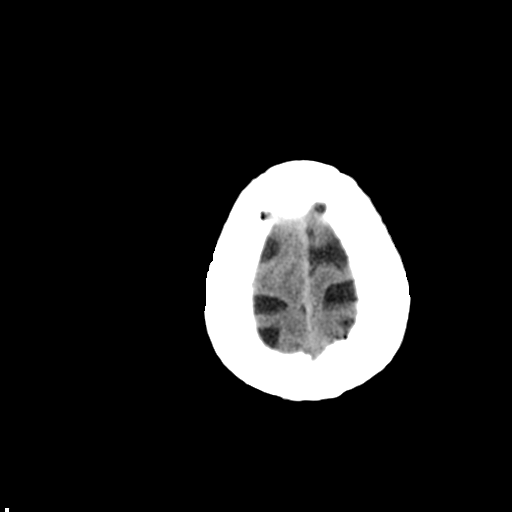
[im 32/36  brain]
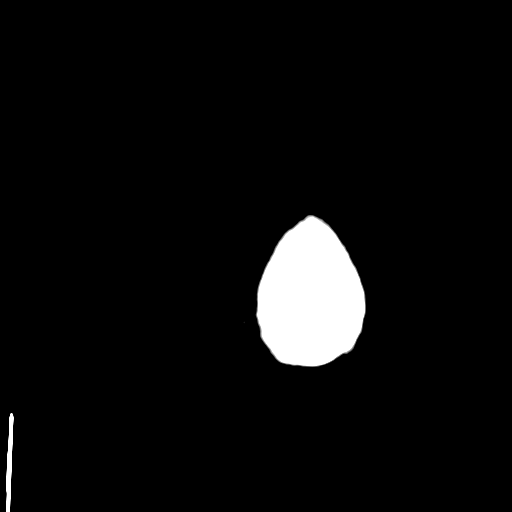
[im 34/36  brain]
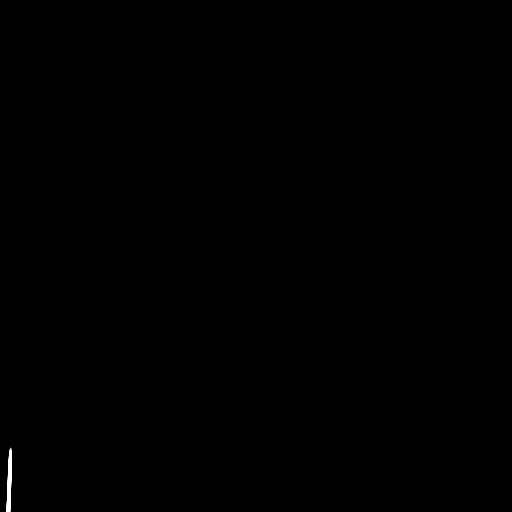

[16 of 30 positions shown; findings below may reference images not displayed]

FINDINGS: Mildly progressive diffuse enlargement of the ventricles
and subarachnoid spaces.  Mildly progressive patchy white matter
low density in both cerebral hemispheres.  No intracranial
hemorrhage, mass lesion or CT evidence of acute infarction.
Unremarkable bones and included paranasal sinuses.  Bilateral
vertebral and internal carotid artery atheromatous calcifications.
IMPRESSION: 1.  No acute abnormality.
2.  Mildly progressive atrophy and chronic small vessel white
matter ischemic changes.

These results were called by telephone on 08/24/2012 at 1648 hours
to Dr. Tiger, who verbally acknowledged these results.

## 2015-05-05 DIAGNOSIS — Z87440 Personal history of urinary (tract) infections: Secondary | ICD-10-CM | POA: Diagnosis not present

## 2015-05-05 DIAGNOSIS — I10 Essential (primary) hypertension: Secondary | ICD-10-CM | POA: Diagnosis not present

## 2015-05-05 DIAGNOSIS — I481 Persistent atrial fibrillation: Secondary | ICD-10-CM | POA: Diagnosis not present

## 2015-07-22 DIAGNOSIS — Z79899 Other long term (current) drug therapy: Secondary | ICD-10-CM | POA: Diagnosis not present

## 2015-07-22 DIAGNOSIS — N390 Urinary tract infection, site not specified: Secondary | ICD-10-CM | POA: Diagnosis not present

## 2015-08-18 DIAGNOSIS — I481 Persistent atrial fibrillation: Secondary | ICD-10-CM | POA: Diagnosis not present

## 2015-08-18 DIAGNOSIS — N183 Chronic kidney disease, stage 3 (moderate): Secondary | ICD-10-CM | POA: Diagnosis not present

## 2015-08-18 DIAGNOSIS — I10 Essential (primary) hypertension: Secondary | ICD-10-CM | POA: Diagnosis not present

## 2015-08-19 DIAGNOSIS — N990 Postprocedural (acute) (chronic) kidney failure: Secondary | ICD-10-CM | POA: Diagnosis not present

## 2016-02-18 DIAGNOSIS — I482 Chronic atrial fibrillation: Secondary | ICD-10-CM | POA: Diagnosis not present

## 2016-02-18 DIAGNOSIS — I10 Essential (primary) hypertension: Secondary | ICD-10-CM | POA: Diagnosis not present

## 2016-02-18 DIAGNOSIS — I129 Hypertensive chronic kidney disease with stage 1 through stage 4 chronic kidney disease, or unspecified chronic kidney disease: Secondary | ICD-10-CM | POA: Diagnosis not present

## 2016-02-19 DIAGNOSIS — D649 Anemia, unspecified: Secondary | ICD-10-CM | POA: Diagnosis not present

## 2016-03-31 DIAGNOSIS — B379 Candidiasis, unspecified: Secondary | ICD-10-CM | POA: Diagnosis not present

## 2016-03-31 DIAGNOSIS — M79604 Pain in right leg: Secondary | ICD-10-CM | POA: Diagnosis not present

## 2016-03-31 DIAGNOSIS — M79661 Pain in right lower leg: Secondary | ICD-10-CM | POA: Diagnosis not present

## 2016-08-26 DIAGNOSIS — N183 Chronic kidney disease, stage 3 (moderate): Secondary | ICD-10-CM | POA: Diagnosis not present

## 2016-08-26 DIAGNOSIS — I482 Chronic atrial fibrillation: Secondary | ICD-10-CM | POA: Diagnosis not present

## 2016-08-26 DIAGNOSIS — I10 Essential (primary) hypertension: Secondary | ICD-10-CM | POA: Diagnosis not present

## 2016-08-28 DIAGNOSIS — E039 Hypothyroidism, unspecified: Secondary | ICD-10-CM | POA: Diagnosis not present

## 2016-08-28 DIAGNOSIS — D649 Anemia, unspecified: Secondary | ICD-10-CM | POA: Diagnosis not present

## 2016-08-28 DIAGNOSIS — E785 Hyperlipidemia, unspecified: Secondary | ICD-10-CM | POA: Diagnosis not present

## 2016-08-28 DIAGNOSIS — I10 Essential (primary) hypertension: Secondary | ICD-10-CM | POA: Diagnosis not present

## 2016-10-23 DIAGNOSIS — N183 Chronic kidney disease, stage 3 (moderate): Secondary | ICD-10-CM | POA: Diagnosis not present

## 2016-10-23 DIAGNOSIS — R293 Abnormal posture: Secondary | ICD-10-CM | POA: Diagnosis not present

## 2016-10-23 DIAGNOSIS — M6281 Muscle weakness (generalized): Secondary | ICD-10-CM | POA: Diagnosis not present

## 2016-10-23 DIAGNOSIS — I69398 Other sequelae of cerebral infarction: Secondary | ICD-10-CM | POA: Diagnosis not present

## 2016-10-24 DIAGNOSIS — M6281 Muscle weakness (generalized): Secondary | ICD-10-CM | POA: Diagnosis not present

## 2016-10-24 DIAGNOSIS — I69398 Other sequelae of cerebral infarction: Secondary | ICD-10-CM | POA: Diagnosis not present

## 2016-10-24 DIAGNOSIS — N183 Chronic kidney disease, stage 3 (moderate): Secondary | ICD-10-CM | POA: Diagnosis not present

## 2016-10-24 DIAGNOSIS — R293 Abnormal posture: Secondary | ICD-10-CM | POA: Diagnosis not present

## 2016-10-27 DIAGNOSIS — R293 Abnormal posture: Secondary | ICD-10-CM | POA: Diagnosis not present

## 2016-10-27 DIAGNOSIS — N183 Chronic kidney disease, stage 3 (moderate): Secondary | ICD-10-CM | POA: Diagnosis not present

## 2016-10-27 DIAGNOSIS — M6281 Muscle weakness (generalized): Secondary | ICD-10-CM | POA: Diagnosis not present

## 2016-10-27 DIAGNOSIS — I69398 Other sequelae of cerebral infarction: Secondary | ICD-10-CM | POA: Diagnosis not present

## 2016-10-30 DIAGNOSIS — N183 Chronic kidney disease, stage 3 (moderate): Secondary | ICD-10-CM | POA: Diagnosis not present

## 2016-10-30 DIAGNOSIS — I69398 Other sequelae of cerebral infarction: Secondary | ICD-10-CM | POA: Diagnosis not present

## 2016-10-30 DIAGNOSIS — M6281 Muscle weakness (generalized): Secondary | ICD-10-CM | POA: Diagnosis not present

## 2016-10-30 DIAGNOSIS — R293 Abnormal posture: Secondary | ICD-10-CM | POA: Diagnosis not present

## 2016-11-02 DIAGNOSIS — R293 Abnormal posture: Secondary | ICD-10-CM | POA: Diagnosis not present

## 2016-11-02 DIAGNOSIS — I69398 Other sequelae of cerebral infarction: Secondary | ICD-10-CM | POA: Diagnosis not present

## 2016-11-02 DIAGNOSIS — M6281 Muscle weakness (generalized): Secondary | ICD-10-CM | POA: Diagnosis not present

## 2016-11-02 DIAGNOSIS — N183 Chronic kidney disease, stage 3 (moderate): Secondary | ICD-10-CM | POA: Diagnosis not present

## 2016-11-03 DIAGNOSIS — R293 Abnormal posture: Secondary | ICD-10-CM | POA: Diagnosis not present

## 2016-11-03 DIAGNOSIS — I69398 Other sequelae of cerebral infarction: Secondary | ICD-10-CM | POA: Diagnosis not present

## 2016-11-03 DIAGNOSIS — M6281 Muscle weakness (generalized): Secondary | ICD-10-CM | POA: Diagnosis not present

## 2016-11-03 DIAGNOSIS — N183 Chronic kidney disease, stage 3 (moderate): Secondary | ICD-10-CM | POA: Diagnosis not present

## 2016-11-06 DIAGNOSIS — M6281 Muscle weakness (generalized): Secondary | ICD-10-CM | POA: Diagnosis not present

## 2016-11-06 DIAGNOSIS — I69398 Other sequelae of cerebral infarction: Secondary | ICD-10-CM | POA: Diagnosis not present

## 2016-11-06 DIAGNOSIS — R293 Abnormal posture: Secondary | ICD-10-CM | POA: Diagnosis not present

## 2016-11-06 DIAGNOSIS — N183 Chronic kidney disease, stage 3 (moderate): Secondary | ICD-10-CM | POA: Diagnosis not present

## 2016-11-07 DIAGNOSIS — M6281 Muscle weakness (generalized): Secondary | ICD-10-CM | POA: Diagnosis not present

## 2016-11-07 DIAGNOSIS — I69398 Other sequelae of cerebral infarction: Secondary | ICD-10-CM | POA: Diagnosis not present

## 2016-11-07 DIAGNOSIS — R293 Abnormal posture: Secondary | ICD-10-CM | POA: Diagnosis not present

## 2016-11-07 DIAGNOSIS — N183 Chronic kidney disease, stage 3 (moderate): Secondary | ICD-10-CM | POA: Diagnosis not present

## 2016-11-08 DIAGNOSIS — I69398 Other sequelae of cerebral infarction: Secondary | ICD-10-CM | POA: Diagnosis not present

## 2016-11-08 DIAGNOSIS — N183 Chronic kidney disease, stage 3 (moderate): Secondary | ICD-10-CM | POA: Diagnosis not present

## 2016-11-08 DIAGNOSIS — M6281 Muscle weakness (generalized): Secondary | ICD-10-CM | POA: Diagnosis not present

## 2016-11-08 DIAGNOSIS — R293 Abnormal posture: Secondary | ICD-10-CM | POA: Diagnosis not present

## 2016-11-10 DIAGNOSIS — H9190 Unspecified hearing loss, unspecified ear: Secondary | ICD-10-CM | POA: Diagnosis not present

## 2016-11-10 DIAGNOSIS — I4891 Unspecified atrial fibrillation: Secondary | ICD-10-CM | POA: Diagnosis not present

## 2016-11-10 DIAGNOSIS — K219 Gastro-esophageal reflux disease without esophagitis: Secondary | ICD-10-CM | POA: Diagnosis not present

## 2016-11-10 DIAGNOSIS — N183 Chronic kidney disease, stage 3 (moderate): Secondary | ICD-10-CM | POA: Diagnosis not present

## 2016-11-10 DIAGNOSIS — M6281 Muscle weakness (generalized): Secondary | ICD-10-CM | POA: Diagnosis not present

## 2016-11-13 DIAGNOSIS — R293 Abnormal posture: Secondary | ICD-10-CM | POA: Diagnosis not present

## 2016-11-13 DIAGNOSIS — M6281 Muscle weakness (generalized): Secondary | ICD-10-CM | POA: Diagnosis not present

## 2016-11-13 DIAGNOSIS — I69398 Other sequelae of cerebral infarction: Secondary | ICD-10-CM | POA: Diagnosis not present

## 2016-11-13 DIAGNOSIS — N183 Chronic kidney disease, stage 3 (moderate): Secondary | ICD-10-CM | POA: Diagnosis not present

## 2016-11-14 DIAGNOSIS — M6281 Muscle weakness (generalized): Secondary | ICD-10-CM | POA: Diagnosis not present

## 2016-11-14 DIAGNOSIS — R293 Abnormal posture: Secondary | ICD-10-CM | POA: Diagnosis not present

## 2016-11-14 DIAGNOSIS — N183 Chronic kidney disease, stage 3 (moderate): Secondary | ICD-10-CM | POA: Diagnosis not present

## 2016-11-14 DIAGNOSIS — I69398 Other sequelae of cerebral infarction: Secondary | ICD-10-CM | POA: Diagnosis not present

## 2016-11-17 DIAGNOSIS — R293 Abnormal posture: Secondary | ICD-10-CM | POA: Diagnosis not present

## 2016-11-17 DIAGNOSIS — M6281 Muscle weakness (generalized): Secondary | ICD-10-CM | POA: Diagnosis not present

## 2016-11-17 DIAGNOSIS — N183 Chronic kidney disease, stage 3 (moderate): Secondary | ICD-10-CM | POA: Diagnosis not present

## 2016-11-17 DIAGNOSIS — I69398 Other sequelae of cerebral infarction: Secondary | ICD-10-CM | POA: Diagnosis not present

## 2016-12-18 DIAGNOSIS — R54 Age-related physical debility: Secondary | ICD-10-CM | POA: Diagnosis not present

## 2016-12-18 DIAGNOSIS — I4891 Unspecified atrial fibrillation: Secondary | ICD-10-CM | POA: Diagnosis not present

## 2016-12-18 DIAGNOSIS — R52 Pain, unspecified: Secondary | ICD-10-CM | POA: Diagnosis not present

## 2016-12-18 DIAGNOSIS — I10 Essential (primary) hypertension: Secondary | ICD-10-CM | POA: Diagnosis not present

## 2016-12-18 DIAGNOSIS — N183 Chronic kidney disease, stage 3 (moderate): Secondary | ICD-10-CM | POA: Diagnosis not present

## 2017-01-10 DIAGNOSIS — N183 Chronic kidney disease, stage 3 (moderate): Secondary | ICD-10-CM | POA: Diagnosis not present

## 2017-01-10 DIAGNOSIS — I10 Essential (primary) hypertension: Secondary | ICD-10-CM | POA: Diagnosis not present

## 2017-01-10 DIAGNOSIS — M6281 Muscle weakness (generalized): Secondary | ICD-10-CM | POA: Diagnosis not present

## 2017-01-10 DIAGNOSIS — I4891 Unspecified atrial fibrillation: Secondary | ICD-10-CM | POA: Diagnosis not present

## 2017-01-10 DIAGNOSIS — I679 Cerebrovascular disease, unspecified: Secondary | ICD-10-CM | POA: Diagnosis not present

## 2017-01-19 DIAGNOSIS — G894 Chronic pain syndrome: Secondary | ICD-10-CM | POA: Diagnosis not present

## 2017-01-19 DIAGNOSIS — M1990 Unspecified osteoarthritis, unspecified site: Secondary | ICD-10-CM | POA: Diagnosis not present

## 2017-02-09 DIAGNOSIS — K5900 Constipation, unspecified: Secondary | ICD-10-CM | POA: Diagnosis not present

## 2017-02-09 DIAGNOSIS — I10 Essential (primary) hypertension: Secondary | ICD-10-CM | POA: Diagnosis not present

## 2017-02-09 DIAGNOSIS — K219 Gastro-esophageal reflux disease without esophagitis: Secondary | ICD-10-CM | POA: Diagnosis not present

## 2017-02-09 DIAGNOSIS — M6281 Muscle weakness (generalized): Secondary | ICD-10-CM | POA: Diagnosis not present

## 2017-02-09 DIAGNOSIS — N183 Chronic kidney disease, stage 3 (moderate): Secondary | ICD-10-CM | POA: Diagnosis not present

## 2017-02-15 DIAGNOSIS — H905 Unspecified sensorineural hearing loss: Secondary | ICD-10-CM | POA: Diagnosis not present

## 2017-02-15 DIAGNOSIS — H6193 Disorder of external ear, unspecified, bilateral: Secondary | ICD-10-CM | POA: Diagnosis not present

## 2017-02-20 DIAGNOSIS — G894 Chronic pain syndrome: Secondary | ICD-10-CM | POA: Diagnosis not present

## 2017-03-20 DIAGNOSIS — K219 Gastro-esophageal reflux disease without esophagitis: Secondary | ICD-10-CM | POA: Diagnosis not present

## 2017-03-20 DIAGNOSIS — N183 Chronic kidney disease, stage 3 (moderate): Secondary | ICD-10-CM | POA: Diagnosis not present

## 2017-03-20 DIAGNOSIS — I4891 Unspecified atrial fibrillation: Secondary | ICD-10-CM | POA: Diagnosis not present

## 2017-03-20 DIAGNOSIS — I679 Cerebrovascular disease, unspecified: Secondary | ICD-10-CM | POA: Diagnosis not present

## 2017-03-20 DIAGNOSIS — M6281 Muscle weakness (generalized): Secondary | ICD-10-CM | POA: Diagnosis not present

## 2017-03-28 DIAGNOSIS — M25569 Pain in unspecified knee: Secondary | ICD-10-CM | POA: Diagnosis not present

## 2017-03-28 DIAGNOSIS — G894 Chronic pain syndrome: Secondary | ICD-10-CM | POA: Diagnosis not present

## 2017-04-06 DIAGNOSIS — E039 Hypothyroidism, unspecified: Secondary | ICD-10-CM | POA: Diagnosis not present

## 2017-04-06 DIAGNOSIS — I10 Essential (primary) hypertension: Secondary | ICD-10-CM | POA: Diagnosis not present

## 2017-04-06 DIAGNOSIS — I4891 Unspecified atrial fibrillation: Secondary | ICD-10-CM | POA: Diagnosis not present

## 2017-04-09 DIAGNOSIS — M6281 Muscle weakness (generalized): Secondary | ICD-10-CM | POA: Diagnosis not present

## 2017-04-09 DIAGNOSIS — W19XXXA Unspecified fall, initial encounter: Secondary | ICD-10-CM | POA: Diagnosis not present

## 2017-04-23 DIAGNOSIS — M6281 Muscle weakness (generalized): Secondary | ICD-10-CM | POA: Diagnosis not present

## 2017-04-23 DIAGNOSIS — I10 Essential (primary) hypertension: Secondary | ICD-10-CM | POA: Diagnosis not present

## 2017-04-23 DIAGNOSIS — K5900 Constipation, unspecified: Secondary | ICD-10-CM | POA: Diagnosis not present

## 2017-04-23 DIAGNOSIS — I679 Cerebrovascular disease, unspecified: Secondary | ICD-10-CM | POA: Diagnosis not present

## 2017-04-23 DIAGNOSIS — N183 Chronic kidney disease, stage 3 (moderate): Secondary | ICD-10-CM | POA: Diagnosis not present

## 2017-04-27 DIAGNOSIS — I679 Cerebrovascular disease, unspecified: Secondary | ICD-10-CM | POA: Diagnosis not present

## 2017-04-27 DIAGNOSIS — M6281 Muscle weakness (generalized): Secondary | ICD-10-CM | POA: Diagnosis not present

## 2017-04-27 DIAGNOSIS — K5900 Constipation, unspecified: Secondary | ICD-10-CM | POA: Diagnosis not present

## 2017-04-27 DIAGNOSIS — I10 Essential (primary) hypertension: Secondary | ICD-10-CM | POA: Diagnosis not present

## 2017-04-27 DIAGNOSIS — N183 Chronic kidney disease, stage 3 (moderate): Secondary | ICD-10-CM | POA: Diagnosis not present

## 2017-05-02 DIAGNOSIS — M1990 Unspecified osteoarthritis, unspecified site: Secondary | ICD-10-CM | POA: Diagnosis not present

## 2017-05-02 DIAGNOSIS — G894 Chronic pain syndrome: Secondary | ICD-10-CM | POA: Diagnosis not present

## 2017-05-09 DIAGNOSIS — R296 Repeated falls: Secondary | ICD-10-CM | POA: Diagnosis not present

## 2017-05-14 DIAGNOSIS — R21 Rash and other nonspecific skin eruption: Secondary | ICD-10-CM | POA: Diagnosis not present

## 2017-05-22 DIAGNOSIS — I4891 Unspecified atrial fibrillation: Secondary | ICD-10-CM | POA: Diagnosis not present

## 2017-05-22 DIAGNOSIS — M6281 Muscle weakness (generalized): Secondary | ICD-10-CM | POA: Diagnosis not present

## 2017-05-22 DIAGNOSIS — K5900 Constipation, unspecified: Secondary | ICD-10-CM | POA: Diagnosis not present

## 2017-05-22 DIAGNOSIS — R400 Somnolence: Secondary | ICD-10-CM | POA: Diagnosis not present

## 2017-05-22 DIAGNOSIS — I679 Cerebrovascular disease, unspecified: Secondary | ICD-10-CM | POA: Diagnosis not present

## 2017-05-31 DIAGNOSIS — G894 Chronic pain syndrome: Secondary | ICD-10-CM | POA: Diagnosis not present

## 2017-05-31 DIAGNOSIS — M1990 Unspecified osteoarthritis, unspecified site: Secondary | ICD-10-CM | POA: Diagnosis not present

## 2017-06-11 DIAGNOSIS — R21 Rash and other nonspecific skin eruption: Secondary | ICD-10-CM | POA: Diagnosis not present

## 2017-06-19 DIAGNOSIS — R293 Abnormal posture: Secondary | ICD-10-CM | POA: Diagnosis not present

## 2017-06-19 DIAGNOSIS — I69398 Other sequelae of cerebral infarction: Secondary | ICD-10-CM | POA: Diagnosis not present

## 2017-06-20 DIAGNOSIS — R293 Abnormal posture: Secondary | ICD-10-CM | POA: Diagnosis not present

## 2017-06-20 DIAGNOSIS — M6281 Muscle weakness (generalized): Secondary | ICD-10-CM | POA: Diagnosis not present

## 2017-06-20 DIAGNOSIS — I69398 Other sequelae of cerebral infarction: Secondary | ICD-10-CM | POA: Diagnosis not present

## 2017-06-20 DIAGNOSIS — K219 Gastro-esophageal reflux disease without esophagitis: Secondary | ICD-10-CM | POA: Diagnosis not present

## 2017-06-20 DIAGNOSIS — N183 Chronic kidney disease, stage 3 (moderate): Secondary | ICD-10-CM | POA: Diagnosis not present

## 2017-06-20 DIAGNOSIS — I10 Essential (primary) hypertension: Secondary | ICD-10-CM | POA: Diagnosis not present

## 2017-06-20 DIAGNOSIS — I679 Cerebrovascular disease, unspecified: Secondary | ICD-10-CM | POA: Diagnosis not present

## 2017-06-21 DIAGNOSIS — R293 Abnormal posture: Secondary | ICD-10-CM | POA: Diagnosis not present

## 2017-06-21 DIAGNOSIS — I69398 Other sequelae of cerebral infarction: Secondary | ICD-10-CM | POA: Diagnosis not present

## 2017-06-22 DIAGNOSIS — I69398 Other sequelae of cerebral infarction: Secondary | ICD-10-CM | POA: Diagnosis not present

## 2017-06-22 DIAGNOSIS — R293 Abnormal posture: Secondary | ICD-10-CM | POA: Diagnosis not present

## 2017-06-25 DIAGNOSIS — I69398 Other sequelae of cerebral infarction: Secondary | ICD-10-CM | POA: Diagnosis not present

## 2017-06-25 DIAGNOSIS — R293 Abnormal posture: Secondary | ICD-10-CM | POA: Diagnosis not present

## 2017-06-26 DIAGNOSIS — M6281 Muscle weakness (generalized): Secondary | ICD-10-CM | POA: Diagnosis not present

## 2017-06-26 DIAGNOSIS — R293 Abnormal posture: Secondary | ICD-10-CM | POA: Diagnosis not present

## 2017-06-26 DIAGNOSIS — I69398 Other sequelae of cerebral infarction: Secondary | ICD-10-CM | POA: Diagnosis not present

## 2017-07-02 DIAGNOSIS — M1990 Unspecified osteoarthritis, unspecified site: Secondary | ICD-10-CM | POA: Diagnosis not present

## 2017-07-02 DIAGNOSIS — G894 Chronic pain syndrome: Secondary | ICD-10-CM | POA: Diagnosis not present

## 2017-07-06 DIAGNOSIS — I10 Essential (primary) hypertension: Secondary | ICD-10-CM | POA: Diagnosis not present

## 2017-07-06 DIAGNOSIS — I4891 Unspecified atrial fibrillation: Secondary | ICD-10-CM | POA: Diagnosis not present

## 2017-07-06 DIAGNOSIS — K219 Gastro-esophageal reflux disease without esophagitis: Secondary | ICD-10-CM | POA: Diagnosis not present

## 2017-07-06 DIAGNOSIS — N182 Chronic kidney disease, stage 2 (mild): Secondary | ICD-10-CM | POA: Diagnosis not present

## 2017-07-06 DIAGNOSIS — R54 Age-related physical debility: Secondary | ICD-10-CM | POA: Diagnosis not present

## 2017-07-13 DIAGNOSIS — I739 Peripheral vascular disease, unspecified: Secondary | ICD-10-CM | POA: Diagnosis not present

## 2017-07-13 DIAGNOSIS — L603 Nail dystrophy: Secondary | ICD-10-CM | POA: Diagnosis not present

## 2017-07-13 DIAGNOSIS — B351 Tinea unguium: Secondary | ICD-10-CM | POA: Diagnosis not present

## 2017-07-13 DIAGNOSIS — Q845 Enlarged and hypertrophic nails: Secondary | ICD-10-CM | POA: Diagnosis not present

## 2017-07-20 DIAGNOSIS — I679 Cerebrovascular disease, unspecified: Secondary | ICD-10-CM | POA: Diagnosis not present

## 2017-07-20 DIAGNOSIS — I4891 Unspecified atrial fibrillation: Secondary | ICD-10-CM | POA: Diagnosis not present

## 2017-07-20 DIAGNOSIS — N182 Chronic kidney disease, stage 2 (mild): Secondary | ICD-10-CM | POA: Diagnosis not present

## 2017-07-20 DIAGNOSIS — R54 Age-related physical debility: Secondary | ICD-10-CM | POA: Diagnosis not present

## 2017-07-20 DIAGNOSIS — K5900 Constipation, unspecified: Secondary | ICD-10-CM | POA: Diagnosis not present

## 2017-07-24 DIAGNOSIS — R21 Rash and other nonspecific skin eruption: Secondary | ICD-10-CM | POA: Diagnosis not present

## 2017-08-02 DIAGNOSIS — M1990 Unspecified osteoarthritis, unspecified site: Secondary | ICD-10-CM | POA: Diagnosis not present

## 2017-08-02 DIAGNOSIS — G894 Chronic pain syndrome: Secondary | ICD-10-CM | POA: Diagnosis not present

## 2017-08-02 DIAGNOSIS — M25569 Pain in unspecified knee: Secondary | ICD-10-CM | POA: Diagnosis not present

## 2017-08-21 DIAGNOSIS — I4891 Unspecified atrial fibrillation: Secondary | ICD-10-CM | POA: Diagnosis not present

## 2017-08-21 DIAGNOSIS — R54 Age-related physical debility: Secondary | ICD-10-CM | POA: Diagnosis not present

## 2017-08-21 DIAGNOSIS — I679 Cerebrovascular disease, unspecified: Secondary | ICD-10-CM | POA: Diagnosis not present

## 2017-08-21 DIAGNOSIS — N182 Chronic kidney disease, stage 2 (mild): Secondary | ICD-10-CM | POA: Diagnosis not present

## 2017-08-21 DIAGNOSIS — K219 Gastro-esophageal reflux disease without esophagitis: Secondary | ICD-10-CM | POA: Diagnosis not present

## 2017-09-03 DIAGNOSIS — Z79899 Other long term (current) drug therapy: Secondary | ICD-10-CM | POA: Diagnosis not present

## 2017-09-03 DIAGNOSIS — D649 Anemia, unspecified: Secondary | ICD-10-CM | POA: Diagnosis not present

## 2017-09-05 DIAGNOSIS — G894 Chronic pain syndrome: Secondary | ICD-10-CM | POA: Diagnosis not present

## 2017-09-05 DIAGNOSIS — M1990 Unspecified osteoarthritis, unspecified site: Secondary | ICD-10-CM | POA: Diagnosis not present

## 2017-09-19 DIAGNOSIS — I739 Peripheral vascular disease, unspecified: Secondary | ICD-10-CM | POA: Diagnosis not present

## 2017-09-19 DIAGNOSIS — L603 Nail dystrophy: Secondary | ICD-10-CM | POA: Diagnosis not present

## 2017-09-19 DIAGNOSIS — B351 Tinea unguium: Secondary | ICD-10-CM | POA: Diagnosis not present

## 2017-09-26 DIAGNOSIS — I679 Cerebrovascular disease, unspecified: Secondary | ICD-10-CM | POA: Diagnosis not present

## 2017-09-26 DIAGNOSIS — K5900 Constipation, unspecified: Secondary | ICD-10-CM | POA: Diagnosis not present

## 2017-09-26 DIAGNOSIS — R54 Age-related physical debility: Secondary | ICD-10-CM | POA: Diagnosis not present

## 2017-09-26 DIAGNOSIS — I10 Essential (primary) hypertension: Secondary | ICD-10-CM | POA: Diagnosis not present

## 2017-09-26 DIAGNOSIS — I4891 Unspecified atrial fibrillation: Secondary | ICD-10-CM | POA: Diagnosis not present

## 2017-10-05 DIAGNOSIS — G894 Chronic pain syndrome: Secondary | ICD-10-CM | POA: Diagnosis not present

## 2017-10-05 DIAGNOSIS — M1990 Unspecified osteoarthritis, unspecified site: Secondary | ICD-10-CM | POA: Diagnosis not present

## 2017-10-24 DIAGNOSIS — R05 Cough: Secondary | ICD-10-CM | POA: Diagnosis not present

## 2017-10-24 DIAGNOSIS — R0989 Other specified symptoms and signs involving the circulatory and respiratory systems: Secondary | ICD-10-CM | POA: Diagnosis not present

## 2017-10-24 DIAGNOSIS — J9811 Atelectasis: Secondary | ICD-10-CM | POA: Diagnosis not present

## 2017-10-31 DIAGNOSIS — R05 Cough: Secondary | ICD-10-CM | POA: Diagnosis not present

## 2017-10-31 DIAGNOSIS — N182 Chronic kidney disease, stage 2 (mild): Secondary | ICD-10-CM | POA: Diagnosis not present

## 2017-10-31 DIAGNOSIS — I679 Cerebrovascular disease, unspecified: Secondary | ICD-10-CM | POA: Diagnosis not present

## 2017-10-31 DIAGNOSIS — R54 Age-related physical debility: Secondary | ICD-10-CM | POA: Diagnosis not present

## 2017-10-31 DIAGNOSIS — I4891 Unspecified atrial fibrillation: Secondary | ICD-10-CM | POA: Diagnosis not present

## 2017-11-05 DIAGNOSIS — I69398 Other sequelae of cerebral infarction: Secondary | ICD-10-CM | POA: Diagnosis not present

## 2017-11-05 DIAGNOSIS — M6281 Muscle weakness (generalized): Secondary | ICD-10-CM | POA: Diagnosis not present

## 2017-11-07 DIAGNOSIS — M1990 Unspecified osteoarthritis, unspecified site: Secondary | ICD-10-CM | POA: Diagnosis not present

## 2017-11-07 DIAGNOSIS — G894 Chronic pain syndrome: Secondary | ICD-10-CM | POA: Diagnosis not present

## 2017-11-15 DIAGNOSIS — K5900 Constipation, unspecified: Secondary | ICD-10-CM | POA: Diagnosis not present

## 2017-11-15 DIAGNOSIS — R54 Age-related physical debility: Secondary | ICD-10-CM | POA: Diagnosis not present

## 2017-11-15 DIAGNOSIS — N182 Chronic kidney disease, stage 2 (mild): Secondary | ICD-10-CM | POA: Diagnosis not present

## 2017-11-15 DIAGNOSIS — I4891 Unspecified atrial fibrillation: Secondary | ICD-10-CM | POA: Diagnosis not present

## 2017-11-15 DIAGNOSIS — I679 Cerebrovascular disease, unspecified: Secondary | ICD-10-CM | POA: Diagnosis not present

## 2017-11-21 DIAGNOSIS — L603 Nail dystrophy: Secondary | ICD-10-CM | POA: Diagnosis not present

## 2017-11-21 DIAGNOSIS — I739 Peripheral vascular disease, unspecified: Secondary | ICD-10-CM | POA: Diagnosis not present

## 2017-11-21 DIAGNOSIS — B351 Tinea unguium: Secondary | ICD-10-CM | POA: Diagnosis not present

## 2017-12-07 DIAGNOSIS — R52 Pain, unspecified: Secondary | ICD-10-CM | POA: Diagnosis not present

## 2017-12-07 DIAGNOSIS — Z7409 Other reduced mobility: Secondary | ICD-10-CM | POA: Diagnosis not present

## 2017-12-07 DIAGNOSIS — B353 Tinea pedis: Secondary | ICD-10-CM | POA: Diagnosis not present

## 2017-12-07 DIAGNOSIS — R2681 Unsteadiness on feet: Secondary | ICD-10-CM | POA: Diagnosis not present

## 2017-12-07 DIAGNOSIS — R54 Age-related physical debility: Secondary | ICD-10-CM | POA: Diagnosis not present

## 2017-12-12 DIAGNOSIS — G894 Chronic pain syndrome: Secondary | ICD-10-CM | POA: Diagnosis not present

## 2017-12-15 DIAGNOSIS — D519 Vitamin B12 deficiency anemia, unspecified: Secondary | ICD-10-CM | POA: Diagnosis not present

## 2017-12-15 DIAGNOSIS — E039 Hypothyroidism, unspecified: Secondary | ICD-10-CM | POA: Diagnosis not present

## 2017-12-15 DIAGNOSIS — D649 Anemia, unspecified: Secondary | ICD-10-CM | POA: Diagnosis not present

## 2017-12-15 DIAGNOSIS — Z79899 Other long term (current) drug therapy: Secondary | ICD-10-CM | POA: Diagnosis not present

## 2018-01-17 DIAGNOSIS — R52 Pain, unspecified: Secondary | ICD-10-CM | POA: Diagnosis not present

## 2018-01-17 DIAGNOSIS — R54 Age-related physical debility: Secondary | ICD-10-CM | POA: Diagnosis not present

## 2018-01-17 DIAGNOSIS — Z7409 Other reduced mobility: Secondary | ICD-10-CM | POA: Diagnosis not present

## 2018-01-17 DIAGNOSIS — I679 Cerebrovascular disease, unspecified: Secondary | ICD-10-CM | POA: Diagnosis not present

## 2018-01-17 DIAGNOSIS — B353 Tinea pedis: Secondary | ICD-10-CM | POA: Diagnosis not present

## 2018-01-23 DIAGNOSIS — B351 Tinea unguium: Secondary | ICD-10-CM | POA: Diagnosis not present

## 2018-01-23 DIAGNOSIS — L603 Nail dystrophy: Secondary | ICD-10-CM | POA: Diagnosis not present

## 2018-01-23 DIAGNOSIS — I739 Peripheral vascular disease, unspecified: Secondary | ICD-10-CM | POA: Diagnosis not present

## 2018-01-23 DIAGNOSIS — Q845 Enlarged and hypertrophic nails: Secondary | ICD-10-CM | POA: Diagnosis not present

## 2018-02-20 DIAGNOSIS — M1990 Unspecified osteoarthritis, unspecified site: Secondary | ICD-10-CM | POA: Diagnosis not present

## 2018-02-20 DIAGNOSIS — G894 Chronic pain syndrome: Secondary | ICD-10-CM | POA: Diagnosis not present

## 2018-03-04 DIAGNOSIS — N62 Hypertrophy of breast: Secondary | ICD-10-CM | POA: Diagnosis not present

## 2018-03-05 DIAGNOSIS — R918 Other nonspecific abnormal finding of lung field: Secondary | ICD-10-CM | POA: Diagnosis not present

## 2018-03-11 DIAGNOSIS — L0292 Furuncle, unspecified: Secondary | ICD-10-CM | POA: Diagnosis not present

## 2018-03-27 DIAGNOSIS — Z0000 Encounter for general adult medical examination without abnormal findings: Secondary | ICD-10-CM | POA: Diagnosis not present

## 2018-03-27 DIAGNOSIS — Q845 Enlarged and hypertrophic nails: Secondary | ICD-10-CM | POA: Diagnosis not present

## 2018-03-27 DIAGNOSIS — I739 Peripheral vascular disease, unspecified: Secondary | ICD-10-CM | POA: Diagnosis not present

## 2018-03-27 DIAGNOSIS — B351 Tinea unguium: Secondary | ICD-10-CM | POA: Diagnosis not present

## 2018-03-27 DIAGNOSIS — L603 Nail dystrophy: Secondary | ICD-10-CM | POA: Diagnosis not present

## 2018-03-28 DIAGNOSIS — R54 Age-related physical debility: Secondary | ICD-10-CM | POA: Diagnosis not present

## 2018-03-28 DIAGNOSIS — R52 Pain, unspecified: Secondary | ICD-10-CM | POA: Diagnosis not present

## 2018-03-28 DIAGNOSIS — I4891 Unspecified atrial fibrillation: Secondary | ICD-10-CM | POA: Diagnosis not present

## 2018-03-28 DIAGNOSIS — I679 Cerebrovascular disease, unspecified: Secondary | ICD-10-CM | POA: Diagnosis not present

## 2018-03-28 DIAGNOSIS — M170 Bilateral primary osteoarthritis of knee: Secondary | ICD-10-CM | POA: Diagnosis not present

## 2018-04-18 DIAGNOSIS — H903 Sensorineural hearing loss, bilateral: Secondary | ICD-10-CM | POA: Diagnosis not present

## 2018-04-24 DIAGNOSIS — R54 Age-related physical debility: Secondary | ICD-10-CM | POA: Diagnosis not present

## 2018-04-24 DIAGNOSIS — Z7409 Other reduced mobility: Secondary | ICD-10-CM | POA: Diagnosis not present

## 2018-04-24 DIAGNOSIS — R296 Repeated falls: Secondary | ICD-10-CM | POA: Diagnosis not present

## 2018-04-24 DIAGNOSIS — R0981 Nasal congestion: Secondary | ICD-10-CM | POA: Diagnosis not present

## 2018-05-15 DIAGNOSIS — Z7982 Long term (current) use of aspirin: Secondary | ICD-10-CM | POA: Diagnosis not present

## 2018-05-15 DIAGNOSIS — M1612 Unilateral primary osteoarthritis, left hip: Secondary | ICD-10-CM | POA: Diagnosis not present

## 2018-05-15 DIAGNOSIS — I679 Cerebrovascular disease, unspecified: Secondary | ICD-10-CM | POA: Diagnosis not present

## 2018-05-15 DIAGNOSIS — M25552 Pain in left hip: Secondary | ICD-10-CM | POA: Diagnosis not present

## 2018-05-16 DIAGNOSIS — D649 Anemia, unspecified: Secondary | ICD-10-CM | POA: Diagnosis not present

## 2018-05-16 DIAGNOSIS — Z79899 Other long term (current) drug therapy: Secondary | ICD-10-CM | POA: Diagnosis not present

## 2018-05-17 DIAGNOSIS — M1612 Unilateral primary osteoarthritis, left hip: Secondary | ICD-10-CM | POA: Diagnosis not present

## 2018-05-23 DIAGNOSIS — M170 Bilateral primary osteoarthritis of knee: Secondary | ICD-10-CM | POA: Diagnosis not present

## 2018-05-23 DIAGNOSIS — R52 Pain, unspecified: Secondary | ICD-10-CM | POA: Diagnosis not present

## 2018-05-23 DIAGNOSIS — R296 Repeated falls: Secondary | ICD-10-CM | POA: Diagnosis not present

## 2018-05-23 DIAGNOSIS — R54 Age-related physical debility: Secondary | ICD-10-CM | POA: Diagnosis not present

## 2018-07-16 DIAGNOSIS — R52 Pain, unspecified: Secondary | ICD-10-CM | POA: Diagnosis not present

## 2018-07-16 DIAGNOSIS — R296 Repeated falls: Secondary | ICD-10-CM | POA: Diagnosis not present

## 2018-07-16 DIAGNOSIS — M170 Bilateral primary osteoarthritis of knee: Secondary | ICD-10-CM | POA: Diagnosis not present

## 2018-07-16 DIAGNOSIS — R54 Age-related physical debility: Secondary | ICD-10-CM | POA: Diagnosis not present

## 2018-07-25 DIAGNOSIS — E559 Vitamin D deficiency, unspecified: Secondary | ICD-10-CM | POA: Diagnosis not present

## 2018-07-25 DIAGNOSIS — R319 Hematuria, unspecified: Secondary | ICD-10-CM | POA: Diagnosis not present

## 2018-07-25 DIAGNOSIS — Z79899 Other long term (current) drug therapy: Secondary | ICD-10-CM | POA: Diagnosis not present

## 2018-07-25 DIAGNOSIS — N390 Urinary tract infection, site not specified: Secondary | ICD-10-CM | POA: Diagnosis not present

## 2018-07-29 DIAGNOSIS — N390 Urinary tract infection, site not specified: Secondary | ICD-10-CM | POA: Diagnosis not present

## 2018-08-07 DIAGNOSIS — K219 Gastro-esophageal reflux disease without esophagitis: Secondary | ICD-10-CM | POA: Diagnosis not present

## 2018-08-07 DIAGNOSIS — I679 Cerebrovascular disease, unspecified: Secondary | ICD-10-CM | POA: Diagnosis not present

## 2018-08-07 DIAGNOSIS — R54 Age-related physical debility: Secondary | ICD-10-CM | POA: Diagnosis not present

## 2018-08-07 DIAGNOSIS — R296 Repeated falls: Secondary | ICD-10-CM | POA: Diagnosis not present

## 2018-08-09 DIAGNOSIS — G894 Chronic pain syndrome: Secondary | ICD-10-CM | POA: Diagnosis not present

## 2018-08-09 DIAGNOSIS — M1990 Unspecified osteoarthritis, unspecified site: Secondary | ICD-10-CM | POA: Diagnosis not present

## 2018-08-31 DIAGNOSIS — M1711 Unilateral primary osteoarthritis, right knee: Secondary | ICD-10-CM | POA: Diagnosis not present

## 2018-09-02 DIAGNOSIS — M25552 Pain in left hip: Secondary | ICD-10-CM | POA: Diagnosis not present

## 2018-09-02 DIAGNOSIS — M1711 Unilateral primary osteoarthritis, right knee: Secondary | ICD-10-CM | POA: Diagnosis not present

## 2018-09-02 DIAGNOSIS — M25562 Pain in left knee: Secondary | ICD-10-CM | POA: Diagnosis not present

## 2018-09-02 DIAGNOSIS — M1612 Unilateral primary osteoarthritis, left hip: Secondary | ICD-10-CM | POA: Diagnosis not present

## 2018-09-03 DIAGNOSIS — M6281 Muscle weakness (generalized): Secondary | ICD-10-CM | POA: Diagnosis not present

## 2018-09-03 DIAGNOSIS — M25561 Pain in right knee: Secondary | ICD-10-CM | POA: Diagnosis not present

## 2018-09-04 DIAGNOSIS — M79661 Pain in right lower leg: Secondary | ICD-10-CM | POA: Diagnosis not present

## 2018-09-04 DIAGNOSIS — Z7409 Other reduced mobility: Secondary | ICD-10-CM | POA: Diagnosis not present

## 2018-09-04 DIAGNOSIS — R296 Repeated falls: Secondary | ICD-10-CM | POA: Diagnosis not present

## 2018-09-08 ENCOUNTER — Emergency Department (HOSPITAL_COMMUNITY): Payer: Medicare Other

## 2018-09-08 ENCOUNTER — Encounter (HOSPITAL_COMMUNITY): Payer: Self-pay

## 2018-09-08 ENCOUNTER — Emergency Department (HOSPITAL_COMMUNITY)
Admission: EM | Admit: 2018-09-08 | Discharge: 2018-09-08 | Disposition: A | Discharge status: Home / Self Care | Payer: Medicare Other | Attending: Emergency Medicine | Admitting: Emergency Medicine

## 2018-09-08 ENCOUNTER — Other Ambulatory Visit: Payer: Self-pay

## 2018-09-08 DIAGNOSIS — R296 Repeated falls: Secondary | ICD-10-CM | POA: Diagnosis not present

## 2018-09-08 DIAGNOSIS — I129 Hypertensive chronic kidney disease with stage 1 through stage 4 chronic kidney disease, or unspecified chronic kidney disease: Secondary | ICD-10-CM | POA: Diagnosis not present

## 2018-09-08 DIAGNOSIS — I639 Cerebral infarction, unspecified: Secondary | ICD-10-CM | POA: Diagnosis not present

## 2018-09-08 DIAGNOSIS — M25561 Pain in right knee: Secondary | ICD-10-CM

## 2018-09-08 DIAGNOSIS — R52 Pain, unspecified: Secondary | ICD-10-CM | POA: Diagnosis not present

## 2018-09-08 DIAGNOSIS — N183 Chronic kidney disease, stage 3 (moderate): Secondary | ICD-10-CM | POA: Diagnosis not present

## 2018-09-08 DIAGNOSIS — M1711 Unilateral primary osteoarthritis, right knee: Secondary | ICD-10-CM | POA: Diagnosis not present

## 2018-09-08 DIAGNOSIS — Z87891 Personal history of nicotine dependence: Secondary | ICD-10-CM | POA: Diagnosis not present

## 2018-09-08 DIAGNOSIS — I959 Hypotension, unspecified: Secondary | ICD-10-CM | POA: Diagnosis not present

## 2018-09-08 DIAGNOSIS — Z743 Need for continuous supervision: Secondary | ICD-10-CM | POA: Diagnosis not present

## 2018-09-08 DIAGNOSIS — M1611 Unilateral primary osteoarthritis, right hip: Secondary | ICD-10-CM | POA: Diagnosis not present

## 2018-09-08 DIAGNOSIS — J9811 Atelectasis: Secondary | ICD-10-CM | POA: Diagnosis not present

## 2018-09-08 DIAGNOSIS — R404 Transient alteration of awareness: Secondary | ICD-10-CM | POA: Diagnosis not present

## 2018-09-08 DIAGNOSIS — Z79899 Other long term (current) drug therapy: Secondary | ICD-10-CM | POA: Diagnosis not present

## 2018-09-08 DIAGNOSIS — R000 Tachycardia, unspecified: Secondary | ICD-10-CM | POA: Diagnosis not present

## 2018-09-08 DIAGNOSIS — F0390 Unspecified dementia without behavioral disturbance: Secondary | ICD-10-CM | POA: Insufficient documentation

## 2018-09-08 DIAGNOSIS — M25551 Pain in right hip: Secondary | ICD-10-CM

## 2018-09-08 DIAGNOSIS — W19XXXA Unspecified fall, initial encounter: Secondary | ICD-10-CM | POA: Diagnosis not present

## 2018-09-08 DIAGNOSIS — R279 Unspecified lack of coordination: Secondary | ICD-10-CM | POA: Diagnosis not present

## 2018-09-08 MED ORDER — KETAMINE HCL 10 MG/ML IJ SOLN
INTRAMUSCULAR | Status: AC | PRN
  Administered 2018-09-08 (×3): 25 mg via INTRAVENOUS

## 2018-09-08 MED ORDER — KETAMINE HCL 50 MG/5ML IJ SOSY
PREFILLED_SYRINGE | INTRAMUSCULAR | Status: AC
  Filled 2018-09-08: qty 5

## 2018-09-08 MED ORDER — FENTANYL CITRATE (PF) 100 MCG/2ML IJ SOLN
25.0000 ug | Freq: Once | INTRAMUSCULAR | Status: AC
  Administered 2018-09-08: 25 ug via INTRAVENOUS
  Filled 2018-09-08: qty 2

## 2018-09-08 MED ORDER — DIAZEPAM 2 MG PO TABS: 5.0000 mg | ORAL_TABLET | Freq: Four times a day (QID) | ORAL | 0 refills | Status: AC | PRN

## 2018-09-08 MED ORDER — SODIUM CHLORIDE 0.9 % IV SOLN
INTRAVENOUS | Status: DC
  Administered 2018-09-08: 18:00:00 via INTRAVENOUS

## 2018-09-08 MED ORDER — LORAZEPAM 2 MG/ML IJ SOLN
1.0000 mg | Freq: Once | INTRAMUSCULAR | Status: AC
  Administered 2018-09-08: 1 mg via INTRAVENOUS
  Filled 2018-09-08: qty 1

## 2018-09-08 MED ORDER — PROPOFOL 10 MG/ML IV BOLUS
INTRAVENOUS | Status: AC | PRN
  Administered 2018-09-08 (×3): 25 mg via INTRAVENOUS

## 2018-09-08 MED ORDER — KETAMINE HCL 50 MG/5ML IJ SOSY
0.5000 mg/kg | PREFILLED_SYRINGE | Freq: Once | INTRAMUSCULAR | Status: AC
  Administered 2018-09-08: 43 mg via INTRAVENOUS
  Filled 2018-09-08: qty 5

## 2018-09-08 MED ORDER — PROPOFOL 10 MG/ML IV BOLUS
0.5000 mg/kg | Freq: Once | INTRAVENOUS | Status: AC
  Administered 2018-09-08: 43.1 mg via INTRAVENOUS
  Filled 2018-09-08: qty 20

## 2018-09-08 NOTE — ED Notes (Signed)
Report given to [place] at [place] [phone]

## 2018-09-08 NOTE — ED Notes (Signed)
PTAR at bedside. All belongings taken to [facility] with ptar

## 2018-09-08 NOTE — ED Triage Notes (Signed)
Per EMS: Pt from [place].  Pt fell 6 days ago, 3 times that day.  Pt was evaluated in ED then, x-ray was clear.  Pt has been yelling out when he moves that leg (the R leg).  Pt was laying on affected leg, resting comfortably.

## 2018-09-08 NOTE — Sedation Documentation (Signed)
Pt transported into room by staff members. Pt's vitals monitored.

## 2018-09-08 NOTE — Sedation Documentation (Signed)
Scan complete.
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
## 2018-09-08 NOTE — Sedation Documentation (Signed)
Knee immobilizer placed by ortho tech.  

## 2018-09-08 NOTE — ED Notes (Signed)
Resp called for sedation and CT aware pt and md are ready

## 2018-09-08 NOTE — Sedation Documentation (Signed)
Scan in progress

## 2018-09-08 NOTE — ED Notes (Addendum)
Nurse supervisor from [place] facility called this RN to inform that prior to pt's falls, pt was able to bear all his weight on both legs.  After his falls he now requires a hoyer lift to move.  She also states that when the patient now sits in a wheelchair he lifts his R leg up, and wont/can't set it down.  The nurse supervisor for this facility can be reached at [phone].

## 2018-09-08 NOTE — Sedation Documentation (Addendum)
Pt moved to CT table by staff members. Vitals monitored.

## 2018-09-08 NOTE — Discharge Instructions (Addendum)
His knee pain is likely from arthritis, and muscle spasm.  To treat the arthritis pain, use Tylenol 650 mg every 4 hours, as needed.  To treat the muscle spasm use Valium 2 mg every 6 hours as needed.  Prescription for Valium was sent to his pharmacy.  Stop taking tramadol, for now.  Wear the knee immobilizer constantly, until he is reevaluated by his doctor.  Use a heating pad on the right hip, 4 times a day for 60 minutes.  Return here, if needed, for problems.

## 2018-09-08 NOTE — ED Notes (Addendum)
PTAR contacted and paperwork printed  

## 2018-09-08 NOTE — ED Notes (Signed)
Patient transported to X-ray 

## 2018-09-08 NOTE — ED Notes (Signed)
Bed: JD05 Expected date:  Expected time:  Means of arrival:  Comments: Fall, multiple falls recently, dementia

## 2018-09-09 DIAGNOSIS — M25561 Pain in right knee: Secondary | ICD-10-CM | POA: Diagnosis not present

## 2018-09-11 DIAGNOSIS — J9811 Atelectasis: Secondary | ICD-10-CM | POA: Diagnosis not present

## 2018-09-11 DIAGNOSIS — G894 Chronic pain syndrome: Secondary | ICD-10-CM | POA: Diagnosis not present

## 2018-09-11 DIAGNOSIS — M1990 Unspecified osteoarthritis, unspecified site: Secondary | ICD-10-CM | POA: Diagnosis not present

## 2018-09-11 DIAGNOSIS — R451 Restlessness and agitation: Secondary | ICD-10-CM | POA: Diagnosis not present

## 2018-09-11 DIAGNOSIS — N320 Bladder-neck obstruction: Secondary | ICD-10-CM | POA: Diagnosis not present

## 2018-09-13 ENCOUNTER — Non-Acute Institutional Stay: Payer: Self-pay | Admitting: Internal Medicine

## 2018-09-13 DIAGNOSIS — Z515 Encounter for palliative care: Secondary | ICD-10-CM | POA: Diagnosis not present

## 2018-09-13 DIAGNOSIS — D649 Anemia, unspecified: Secondary | ICD-10-CM | POA: Diagnosis not present

## 2018-09-13 DIAGNOSIS — I10 Essential (primary) hypertension: Secondary | ICD-10-CM | POA: Diagnosis not present
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
## 2018-09-17 ENCOUNTER — Emergency Department (HOSPITAL_COMMUNITY)
Admission: EM | Admit: 2018-09-17 | Discharge: 2018-09-17 | Disposition: A | Discharge status: Home / Self Care | Payer: Medicare Other | Attending: Emergency Medicine | Admitting: Emergency Medicine

## 2018-09-17 DIAGNOSIS — Z743 Need for continuous supervision: Secondary | ICD-10-CM | POA: Diagnosis not present

## 2018-09-17 DIAGNOSIS — F0390 Unspecified dementia without behavioral disturbance: Secondary | ICD-10-CM | POA: Insufficient documentation

## 2018-09-17 DIAGNOSIS — E43 Unspecified severe protein-calorie malnutrition: Secondary | ICD-10-CM | POA: Diagnosis not present

## 2018-09-17 DIAGNOSIS — Z885 Allergy status to narcotic agent status: Secondary | ICD-10-CM | POA: Diagnosis not present

## 2018-09-17 DIAGNOSIS — E860 Dehydration: Secondary | ICD-10-CM | POA: Diagnosis not present

## 2018-09-17 DIAGNOSIS — I4891 Unspecified atrial fibrillation: Secondary | ICD-10-CM | POA: Diagnosis not present

## 2018-09-17 DIAGNOSIS — M25551 Pain in right hip: Secondary | ICD-10-CM | POA: Diagnosis not present

## 2018-09-17 DIAGNOSIS — Z87891 Personal history of nicotine dependence: Secondary | ICD-10-CM | POA: Diagnosis not present

## 2018-09-17 DIAGNOSIS — Z79899 Other long term (current) drug therapy: Secondary | ICD-10-CM | POA: Diagnosis not present

## 2018-09-17 DIAGNOSIS — M25572 Pain in left ankle and joints of left foot: Secondary | ICD-10-CM | POA: Diagnosis not present

## 2018-09-17 DIAGNOSIS — Z880 Allergy status to penicillin: Secondary | ICD-10-CM | POA: Diagnosis not present

## 2018-09-17 DIAGNOSIS — N183 Chronic kidney disease, stage 3 (moderate): Secondary | ICD-10-CM | POA: Insufficient documentation

## 2018-09-17 DIAGNOSIS — I129 Hypertensive chronic kidney disease with stage 1 through stage 4 chronic kidney disease, or unspecified chronic kidney disease: Secondary | ICD-10-CM | POA: Diagnosis not present

## 2018-09-17 DIAGNOSIS — Z8673 Personal history of transient ischemic attack (TIA), and cerebral infarction without residual deficits: Secondary | ICD-10-CM | POA: Insufficient documentation

## 2018-09-17 DIAGNOSIS — I10 Essential (primary) hypertension: Secondary | ICD-10-CM | POA: Diagnosis not present

## 2018-09-17 DIAGNOSIS — R627 Adult failure to thrive: Secondary | ICD-10-CM | POA: Diagnosis not present

## 2018-09-17 DIAGNOSIS — M79604 Pain in right leg: Secondary | ICD-10-CM | POA: Diagnosis not present

## 2018-09-17 DIAGNOSIS — R54 Age-related physical debility: Secondary | ICD-10-CM | POA: Diagnosis not present

## 2018-09-17 DIAGNOSIS — R279 Unspecified lack of coordination: Secondary | ICD-10-CM | POA: Diagnosis not present

## 2018-09-17 DIAGNOSIS — R404 Transient alteration of awareness: Secondary | ICD-10-CM | POA: Diagnosis not present

## 2018-09-17 DIAGNOSIS — W19XXXA Unspecified fall, initial encounter: Secondary | ICD-10-CM | POA: Diagnosis not present

## 2018-09-17 MED ORDER — FENTANYL CITRATE (PF) 100 MCG/2ML IJ SOLN
25.0000 ug | Freq: Once | INTRAMUSCULAR | Status: AC
  Administered 2018-09-17: 15:00:00 25 ug via INTRAVENOUS
  Filled 2018-09-17: qty 2
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
## 2018-09-17 NOTE — ED Notes (Signed)
Continues to await PTAR-patient mumbling to self

## 2018-09-17 NOTE — ED Notes (Signed)
PTAR has been contacted regarding patient transport.  

## 2018-09-17 NOTE — ED Notes (Signed)
ED Provider at bedside. 

## 2018-09-17 NOTE — ED Notes (Signed)
Patient is not verbal to simple questions asked-attempting to strike this writer when attempting vital signs-only agitated when disturbed
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
## 2018-09-17 NOTE — ED Notes (Signed)
Bed: [facility]ALD Expected date:  Expected time:  Means of arrival:  Comments: 
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
## 2018-09-17 NOTE — ED Notes (Signed)
Continues to await PTAR-patient continues to mumble to self

## 2018-09-17 NOTE — ED Triage Notes (Signed)
Transported by [employer] from [facility]. Hx of dementia, oriented per baseline. Right leg/right hip pain since 6/26--has been seen for same with no identified cause. No obvious deformity/ injury/falls.

## 2018-09-18 ENCOUNTER — Telehealth: Payer: Self-pay | Admitting: Internal Medicine

## 2018-09-18 DIAGNOSIS — R627 Adult failure to thrive: Secondary | ICD-10-CM | POA: Diagnosis not present

## 2018-09-18 DIAGNOSIS — T24291A Burn of second degree of multiple sites of right lower limb, except ankle and foot, initial encounter: Secondary | ICD-10-CM | POA: Diagnosis not present

## 2018-09-18 DIAGNOSIS — R52 Pain, unspecified: Secondary | ICD-10-CM | POA: Diagnosis not present

## 2018-09-18 NOTE — Telephone Encounter (Signed)
Phoned son/POA Lorraine Lax to discuss pt's current health status and goals that he and pt's wife have for him. He stated that he is aware of how ill he is and only wants comfort care and relief of pain and suffering for his father.  Discussed advanced directives and he confirmed desire for DNAR status.  MOST form selections were comfort measures without return to the hospital, no IV fluids or antibiotics and no feeding tube.  He states that he is agreeable with transition to [facility] if pt is eligible for same.   Discussion with Dr. Gildardo Cranker related to pt status who agrees that pt is eligible for Hospice services.  Verbal order for Hospice referral obtained from Humberto Leep, NP of [facility].  [place] will be notified of verbal order for transition to Hospice care.      Gonzella Lex, NP-c

## 2018-10-12 DEATH — deceased
